# Patient Record
Sex: Female | Born: 1955
Health system: Southern US, Community
[De-identification: ages and names within clinical notes are randomized; demographics above are authoritative.]

## PROBLEM LIST (undated history)

## (undated) DIAGNOSIS — I10 Essential (primary) hypertension: Secondary | ICD-10-CM

## (undated) DIAGNOSIS — R011 Cardiac murmur, unspecified: Secondary | ICD-10-CM

## (undated) DIAGNOSIS — Z87442 Personal history of urinary calculi: Secondary | ICD-10-CM

## (undated) DIAGNOSIS — Z9221 Personal history of antineoplastic chemotherapy: Secondary | ICD-10-CM

## (undated) DIAGNOSIS — G5760 Lesion of plantar nerve, unspecified lower limb: Secondary | ICD-10-CM

## (undated) DIAGNOSIS — J189 Pneumonia, unspecified organism: Secondary | ICD-10-CM

## (undated) DIAGNOSIS — C801 Malignant (primary) neoplasm, unspecified: Secondary | ICD-10-CM

## (undated) DIAGNOSIS — I350 Nonrheumatic aortic (valve) stenosis: Secondary | ICD-10-CM

## (undated) DIAGNOSIS — K219 Gastro-esophageal reflux disease without esophagitis: Secondary | ICD-10-CM

## (undated) DIAGNOSIS — Z923 Personal history of irradiation: Secondary | ICD-10-CM

## (undated) DIAGNOSIS — R7303 Prediabetes: Secondary | ICD-10-CM

## (undated) DIAGNOSIS — M1990 Unspecified osteoarthritis, unspecified site: Secondary | ICD-10-CM

## (undated) DIAGNOSIS — E785 Hyperlipidemia, unspecified: Secondary | ICD-10-CM

## (undated) DIAGNOSIS — I499 Cardiac arrhythmia, unspecified: Secondary | ICD-10-CM

## (undated) DIAGNOSIS — F419 Anxiety disorder, unspecified: Secondary | ICD-10-CM

## (undated) DIAGNOSIS — I2510 Atherosclerotic heart disease of native coronary artery without angina pectoris: Secondary | ICD-10-CM

## (undated) DIAGNOSIS — C50919 Malignant neoplasm of unspecified site of unspecified female breast: Secondary | ICD-10-CM

## (undated) DIAGNOSIS — I509 Heart failure, unspecified: Secondary | ICD-10-CM

## (undated) HISTORY — DX: Heart failure, unspecified: I50.9

## (undated) HISTORY — DX: Malignant neoplasm of unspecified site of unspecified female breast: C50.919

## (undated) HISTORY — DX: Hyperlipidemia, unspecified: E78.5

## (undated) HISTORY — DX: Gastro-esophageal reflux disease without esophagitis: K21.9

## (undated) HISTORY — PX: BREAST LUMPECTOMY: SHX2

## (undated) HISTORY — DX: Malignant (primary) neoplasm, unspecified: C80.1

## (undated) HISTORY — DX: Nonrheumatic aortic (valve) stenosis: I35.0

## (undated) HISTORY — DX: Essential (primary) hypertension: I10

## (undated) HISTORY — DX: Lesion of plantar nerve, unspecified lower limb: G57.60

---

## 2002-07-21 DIAGNOSIS — C50919 Malignant neoplasm of unspecified site of unspecified female breast: Secondary | ICD-10-CM

## 2002-07-21 HISTORY — DX: Malignant neoplasm of unspecified site of unspecified female breast: C50.919

## 2002-10-20 DIAGNOSIS — C801 Malignant (primary) neoplasm, unspecified: Secondary | ICD-10-CM

## 2002-10-20 HISTORY — PX: BREAST SURGERY: SHX581

## 2002-10-20 HISTORY — DX: Malignant (primary) neoplasm, unspecified: C80.1

## 2002-11-19 DIAGNOSIS — Z9221 Personal history of antineoplastic chemotherapy: Secondary | ICD-10-CM

## 2002-11-19 HISTORY — DX: Personal history of antineoplastic chemotherapy: Z92.21

## 2003-02-19 DIAGNOSIS — Z923 Personal history of irradiation: Secondary | ICD-10-CM

## 2003-02-19 HISTORY — DX: Personal history of irradiation: Z92.3

## 2003-02-27 ENCOUNTER — Ambulatory Visit: Admission: RE | Admit: 2003-02-27 | Discharge: 2003-04-28 | Payer: Self-pay | Admitting: *Deleted

## 2003-06-02 ENCOUNTER — Ambulatory Visit: Admission: RE | Admit: 2003-06-02 | Discharge: 2003-06-02 | Payer: Self-pay | Admitting: *Deleted

## 2003-08-24 ENCOUNTER — Other Ambulatory Visit: Admission: RE | Admit: 2003-08-24 | Discharge: 2003-08-24 | Payer: Self-pay | Admitting: Obstetrics and Gynecology

## 2003-09-11 ENCOUNTER — Encounter: Admission: RE | Admit: 2003-09-11 | Discharge: 2003-09-11 | Payer: Self-pay | Admitting: Oncology

## 2004-05-17 ENCOUNTER — Ambulatory Visit: Payer: Self-pay | Admitting: Oncology

## 2004-05-21 ENCOUNTER — Encounter: Admission: RE | Admit: 2004-05-21 | Discharge: 2004-05-21 | Payer: Self-pay | Admitting: Internal Medicine

## 2004-06-07 ENCOUNTER — Ambulatory Visit: Admission: RE | Admit: 2004-06-07 | Discharge: 2004-06-07 | Payer: Self-pay | Admitting: *Deleted

## 2004-08-29 ENCOUNTER — Other Ambulatory Visit: Admission: RE | Admit: 2004-08-29 | Discharge: 2004-08-29 | Payer: Self-pay | Admitting: Obstetrics and Gynecology

## 2004-09-10 ENCOUNTER — Ambulatory Visit: Payer: Self-pay | Admitting: Oncology

## 2004-09-11 ENCOUNTER — Encounter: Admission: RE | Admit: 2004-09-11 | Discharge: 2004-09-11 | Payer: Self-pay | Admitting: Oncology

## 2004-11-01 ENCOUNTER — Ambulatory Visit: Payer: Self-pay | Admitting: Oncology

## 2005-02-28 ENCOUNTER — Ambulatory Visit: Payer: Self-pay | Admitting: Oncology

## 2005-07-17 ENCOUNTER — Ambulatory Visit: Payer: Self-pay | Admitting: Oncology

## 2005-09-15 ENCOUNTER — Other Ambulatory Visit: Admission: RE | Admit: 2005-09-15 | Discharge: 2005-09-15 | Payer: Self-pay | Admitting: Obstetrics and Gynecology

## 2005-09-16 ENCOUNTER — Encounter: Admission: RE | Admit: 2005-09-16 | Discharge: 2005-09-16 | Payer: Self-pay | Admitting: Oncology

## 2005-09-26 ENCOUNTER — Ambulatory Visit: Payer: Self-pay | Admitting: Oncology

## 2005-09-29 ENCOUNTER — Ambulatory Visit: Payer: Self-pay

## 2005-09-29 ENCOUNTER — Encounter: Payer: Self-pay | Admitting: Internal Medicine

## 2006-01-12 ENCOUNTER — Ambulatory Visit: Payer: Self-pay

## 2006-01-12 ENCOUNTER — Encounter: Payer: Self-pay | Admitting: Cardiology

## 2006-01-12 ENCOUNTER — Ambulatory Visit: Payer: Self-pay | Admitting: Oncology

## 2006-01-12 LAB — COMPREHENSIVE METABOLIC PANEL
ALT: 31 U/L (ref 0–40)
AST: 21 U/L (ref 0–37)
Calcium: 8.9 mg/dL (ref 8.4–10.5)
Chloride: 102 mEq/L (ref 96–112)
Creatinine, Ser: 0.83 mg/dL (ref 0.40–1.20)
Sodium: 138 mEq/L (ref 135–145)
Total Bilirubin: 0.8 mg/dL (ref 0.3–1.2)
Total Protein: 6.8 g/dL (ref 6.0–8.3)

## 2006-01-12 LAB — CBC WITH DIFFERENTIAL/PLATELET
Basophils Absolute: 0 10*3/uL (ref 0.0–0.1)
HCT: 33.9 % — ABNORMAL LOW (ref 34.8–46.6)
HGB: 11.6 g/dL (ref 11.6–15.9)
LYMPH%: 31.8 % (ref 14.0–48.0)
MONO#: 0.4 10*3/uL (ref 0.1–0.9)
NEUT%: 57.5 % (ref 39.6–76.8)
Platelets: 327 10*3/uL (ref 145–400)
WBC: 4.2 10*3/uL (ref 3.9–10.0)
lymph#: 1.3 10*3/uL (ref 0.9–3.3)

## 2006-01-12 LAB — RESEARCH LABS

## 2006-04-02 ENCOUNTER — Ambulatory Visit: Payer: Self-pay | Admitting: Oncology

## 2006-04-06 ENCOUNTER — Ambulatory Visit: Payer: Self-pay

## 2006-04-06 ENCOUNTER — Encounter: Payer: Self-pay | Admitting: Cardiovascular Disease

## 2006-06-09 ENCOUNTER — Ambulatory Visit: Payer: Self-pay | Admitting: Internal Medicine

## 2006-06-22 ENCOUNTER — Ambulatory Visit: Payer: Self-pay | Admitting: Internal Medicine

## 2006-07-03 ENCOUNTER — Ambulatory Visit: Payer: Self-pay | Admitting: Oncology

## 2006-07-08 ENCOUNTER — Encounter: Payer: Self-pay | Admitting: Cardiology

## 2006-07-08 ENCOUNTER — Ambulatory Visit: Payer: Self-pay

## 2006-08-05 ENCOUNTER — Ambulatory Visit: Payer: Self-pay | Admitting: Oncology

## 2006-08-05 LAB — COMPREHENSIVE METABOLIC PANEL
CO2: 26 mEq/L (ref 19–32)
Creatinine, Ser: 0.76 mg/dL (ref 0.40–1.20)
Glucose, Bld: 96 mg/dL (ref 70–99)
Total Bilirubin: 0.5 mg/dL (ref 0.3–1.2)

## 2006-09-17 ENCOUNTER — Encounter: Admission: RE | Admit: 2006-09-17 | Discharge: 2006-09-17 | Payer: Self-pay | Admitting: Oncology

## 2006-09-20 ENCOUNTER — Encounter: Admission: RE | Admit: 2006-09-20 | Discharge: 2006-09-20 | Payer: Self-pay | Admitting: Oncology

## 2006-09-30 ENCOUNTER — Encounter: Admission: RE | Admit: 2006-09-30 | Discharge: 2006-09-30 | Payer: Self-pay | Admitting: Oncology

## 2007-01-05 ENCOUNTER — Ambulatory Visit: Payer: Self-pay | Admitting: Oncology

## 2007-01-08 LAB — COMPREHENSIVE METABOLIC PANEL
CO2: 27 mEq/L (ref 19–32)
Creatinine, Ser: 0.83 mg/dL (ref 0.40–1.20)
Glucose, Bld: 101 mg/dL — ABNORMAL HIGH (ref 70–99)
Total Bilirubin: 0.8 mg/dL (ref 0.3–1.2)
Total Protein: 7.8 g/dL (ref 6.0–8.3)

## 2007-01-08 LAB — CBC WITH DIFFERENTIAL/PLATELET
Basophils Absolute: 0 10*3/uL (ref 0.0–0.1)
Eosinophils Absolute: 0.1 10*3/uL (ref 0.0–0.5)
HCT: 37.9 % (ref 34.8–46.6)
LYMPH%: 33.8 % (ref 14.0–48.0)
MONO#: 0.3 10*3/uL (ref 0.1–0.9)
NEUT#: 2.4 10*3/uL (ref 1.5–6.5)
NEUT%: 55.1 % (ref 39.6–76.8)
Platelets: 293 10*3/uL (ref 145–400)
WBC: 4.3 10*3/uL (ref 3.9–10.0)

## 2007-02-05 ENCOUNTER — Encounter: Payer: Self-pay | Admitting: Oncology

## 2007-02-05 ENCOUNTER — Ambulatory Visit: Payer: Self-pay

## 2007-06-28 ENCOUNTER — Ambulatory Visit: Payer: Self-pay | Admitting: Oncology

## 2007-06-30 ENCOUNTER — Encounter: Payer: Self-pay | Admitting: Internal Medicine

## 2007-06-30 LAB — COMPREHENSIVE METABOLIC PANEL
ALT: 30 U/L (ref 0–35)
Albumin: 4.5 g/dL (ref 3.5–5.2)
Alkaline Phosphatase: 94 U/L (ref 39–117)
Potassium: 4 mEq/L (ref 3.5–5.3)
Sodium: 142 mEq/L (ref 135–145)
Total Bilirubin: 0.3 mg/dL (ref 0.3–1.2)
Total Protein: 7.5 g/dL (ref 6.0–8.3)

## 2007-06-30 LAB — CBC WITH DIFFERENTIAL/PLATELET
Eosinophils Absolute: 0.1 10*3/uL (ref 0.0–0.5)
HCT: 36.6 % (ref 34.8–46.6)
LYMPH%: 30.3 % (ref 14.0–48.0)
MONO#: 0.3 10*3/uL (ref 0.1–0.9)
NEUT#: 2.9 10*3/uL (ref 1.5–6.5)
Platelets: 352 10*3/uL (ref 145–400)
RBC: 4.51 10*6/uL (ref 3.70–5.32)
WBC: 4.9 10*3/uL (ref 3.9–10.0)

## 2007-09-27 ENCOUNTER — Encounter: Admission: RE | Admit: 2007-09-27 | Discharge: 2007-09-27 | Payer: Self-pay | Admitting: Oncology

## 2007-12-29 ENCOUNTER — Ambulatory Visit: Payer: Self-pay | Admitting: Oncology

## 2007-12-31 ENCOUNTER — Encounter: Payer: Self-pay | Admitting: Internal Medicine

## 2007-12-31 LAB — COMPREHENSIVE METABOLIC PANEL
ALT: 30 U/L (ref 0–35)
AST: 21 U/L (ref 0–37)
Albumin: 4.6 g/dL (ref 3.5–5.2)
Alkaline Phosphatase: 87 U/L (ref 39–117)
BUN: 16 mg/dL (ref 6–23)
Calcium: 9.5 mg/dL (ref 8.4–10.5)
Chloride: 105 mEq/L (ref 96–112)
Creatinine, Ser: 0.85 mg/dL (ref 0.40–1.20)
Potassium: 4.3 mEq/L (ref 3.5–5.3)

## 2007-12-31 LAB — CBC WITH DIFFERENTIAL/PLATELET
BASO%: 1.1 % (ref 0.0–2.0)
Basophils Absolute: 0 10*3/uL (ref 0.0–0.1)
EOS%: 4.7 % (ref 0.0–7.0)
MCH: 27.7 pg (ref 26.0–34.0)
MCHC: 34.5 g/dL (ref 32.0–36.0)
MCV: 80.3 fL — ABNORMAL LOW (ref 81.0–101.0)
MONO%: 7.9 % (ref 0.0–13.0)
RDW: 13.6 % (ref 11.3–14.5)
lymph#: 1.6 10*3/uL (ref 0.9–3.3)

## 2008-01-07 ENCOUNTER — Encounter: Admission: RE | Admit: 2008-01-07 | Discharge: 2008-01-07 | Payer: Self-pay | Admitting: Oncology

## 2008-03-13 ENCOUNTER — Ambulatory Visit (HOSPITAL_COMMUNITY): Admission: RE | Admit: 2008-03-13 | Discharge: 2008-03-13 | Payer: Self-pay | Admitting: Urology

## 2008-03-15 ENCOUNTER — Ambulatory Visit: Payer: Self-pay | Admitting: Oncology

## 2008-04-21 ENCOUNTER — Encounter: Payer: Self-pay | Admitting: Internal Medicine

## 2008-04-21 LAB — CBC WITH DIFFERENTIAL/PLATELET
BASO%: 0.9 % (ref 0.0–2.0)
HCT: 37.2 % (ref 34.8–46.6)
LYMPH%: 29.7 % (ref 14.0–48.0)
MCH: 28.3 pg (ref 26.0–34.0)
MCHC: 34.7 g/dL (ref 32.0–36.0)
MCV: 81.7 fL (ref 81.0–101.0)
MONO#: 0.4 10*3/uL (ref 0.1–0.9)
MONO%: 9.2 % (ref 0.0–13.0)
NEUT%: 54.1 % (ref 39.6–76.8)
Platelets: 292 10*3/uL (ref 145–400)
RBC: 4.55 10*6/uL (ref 3.70–5.32)
WBC: 4.7 10*3/uL (ref 3.9–10.0)

## 2008-04-21 LAB — COMPREHENSIVE METABOLIC PANEL
ALT: 50 U/L — ABNORMAL HIGH (ref 0–35)
Alkaline Phosphatase: 90 U/L (ref 39–117)
CO2: 26 mEq/L (ref 19–32)
Creatinine, Ser: 0.81 mg/dL (ref 0.40–1.20)
Sodium: 140 mEq/L (ref 135–145)
Total Bilirubin: 0.4 mg/dL (ref 0.3–1.2)

## 2008-05-17 ENCOUNTER — Ambulatory Visit: Payer: Self-pay | Admitting: Oncology

## 2008-05-19 LAB — COMPREHENSIVE METABOLIC PANEL
Albumin: 4.7 g/dL (ref 3.5–5.2)
BUN: 19 mg/dL (ref 6–23)
CO2: 25 mEq/L (ref 19–32)
Glucose, Bld: 87 mg/dL (ref 70–99)
Potassium: 4 mEq/L (ref 3.5–5.3)
Sodium: 138 mEq/L (ref 135–145)
Total Bilirubin: 0.3 mg/dL (ref 0.3–1.2)
Total Protein: 7.6 g/dL (ref 6.0–8.3)

## 2008-09-27 ENCOUNTER — Encounter: Admission: RE | Admit: 2008-09-27 | Discharge: 2008-09-27 | Payer: Self-pay | Admitting: Oncology

## 2008-12-27 ENCOUNTER — Ambulatory Visit: Payer: Self-pay | Admitting: Oncology

## 2008-12-29 LAB — COMPREHENSIVE METABOLIC PANEL
ALT: 15 U/L (ref 0–35)
AST: 21 U/L (ref 0–37)
Albumin: 4.7 g/dL (ref 3.5–5.2)
Alkaline Phosphatase: 68 U/L (ref 39–117)
Glucose, Bld: 116 mg/dL — ABNORMAL HIGH (ref 70–99)
Potassium: 4.3 mEq/L (ref 3.5–5.3)
Sodium: 140 mEq/L (ref 135–145)
Total Bilirubin: 0.7 mg/dL (ref 0.3–1.2)
Total Protein: 7.5 g/dL (ref 6.0–8.3)

## 2008-12-29 LAB — CBC WITH DIFFERENTIAL/PLATELET
MCH: 28.6 pg (ref 25.1–34.0)
MCHC: 35 g/dL (ref 31.5–36.0)
MONO#: 0.3 10*3/uL (ref 0.1–0.9)
NEUT%: 50.9 % (ref 38.4–76.8)
RBC: 4.52 10*6/uL (ref 3.70–5.45)
RDW: 13.4 % (ref 11.2–14.5)
WBC: 3.5 10*3/uL — ABNORMAL LOW (ref 3.9–10.3)
lymph#: 1.3 10*3/uL (ref 0.9–3.3)

## 2009-01-12 ENCOUNTER — Encounter: Admission: RE | Admit: 2009-01-12 | Discharge: 2009-01-12 | Payer: Self-pay | Admitting: Oncology

## 2009-01-25 ENCOUNTER — Ambulatory Visit: Payer: Self-pay | Admitting: Oncology

## 2009-03-07 ENCOUNTER — Ambulatory Visit: Payer: Self-pay | Admitting: Oncology

## 2009-06-25 ENCOUNTER — Ambulatory Visit: Payer: Self-pay | Admitting: Oncology

## 2009-06-27 LAB — CBC WITH DIFFERENTIAL/PLATELET
Basophils Absolute: 0 10*3/uL (ref 0.0–0.1)
EOS%: 2.6 % (ref 0.0–7.0)
HGB: 12.1 g/dL (ref 11.6–15.9)
LYMPH%: 31 % (ref 14.0–49.7)
MCH: 28.1 pg (ref 25.1–34.0)
MCHC: 33.6 g/dL (ref 31.5–36.0)
MCV: 83.6 fL (ref 79.5–101.0)
NEUT#: 2.9 10*3/uL (ref 1.5–6.5)
NEUT%: 57.7 % (ref 38.4–76.8)
WBC: 5.1 10*3/uL (ref 3.9–10.3)
lymph#: 1.6 10*3/uL (ref 0.9–3.3)

## 2009-06-27 LAB — COMPREHENSIVE METABOLIC PANEL
ALT: 221 U/L — ABNORMAL HIGH (ref 0–35)
AST: 22 U/L (ref 0–37)
Albumin: 4.2 g/dL (ref 3.5–5.2)
Alkaline Phosphatase: 65 U/L (ref 39–117)
BUN: 11 mg/dL (ref 6–23)
CO2: 30 mEq/L (ref 19–32)
Calcium: 9.3 mg/dL (ref 8.4–10.5)
Chloride: 102 mEq/L (ref 96–112)
Sodium: 139 mEq/L (ref 135–145)

## 2009-06-28 LAB — VITAMIN D 25 HYDROXY (VIT D DEFICIENCY, FRACTURES): Vit D, 25-Hydroxy: 42 ng/mL (ref 30–89)

## 2009-07-06 ENCOUNTER — Encounter: Payer: Self-pay | Admitting: Internal Medicine

## 2009-07-06 LAB — COMPREHENSIVE METABOLIC PANEL
ALT: 28 U/L (ref 0–35)
AST: 22 U/L (ref 0–37)
Albumin: 4.2 g/dL (ref 3.5–5.2)
Alkaline Phosphatase: 69 U/L (ref 39–117)
CO2: 31 mEq/L (ref 19–32)
Calcium: 10.3 mg/dL (ref 8.4–10.5)
Potassium: 4.5 mEq/L (ref 3.5–5.3)

## 2009-09-28 ENCOUNTER — Encounter: Admission: RE | Admit: 2009-09-28 | Discharge: 2009-09-28 | Payer: Self-pay | Admitting: Oncology

## 2009-10-11 IMAGING — CT CT UROGRAM
2 of 3 series · 15 of 42 positions shown, 19 images · non-contrast
Comparison: NONE

CLINICAL DATA: [REDACTED] pain. 

CT UROGRAM
TECHNIQUE: Thin-section unenhanced axial images were obtained to 
provide a CT urogram to evaluate for possible urinary tract stone. 
 Scan thicknesses were 3 mm with 3-mm increments. COMPARISON:No 
prior exam.

[Series 2: wo · axial · 0.68mm/px · z∈[+436,+805]mm · 12 of 139 slices shown, 16 images]
[im 8/139  soft-tissue]
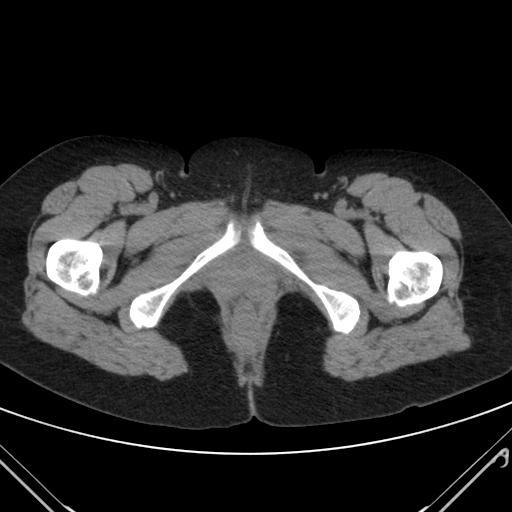
[im 8/139  bone]
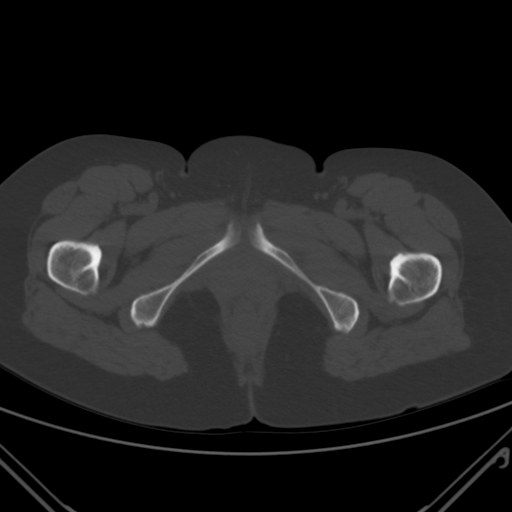
[im 22/139  soft-tissue]
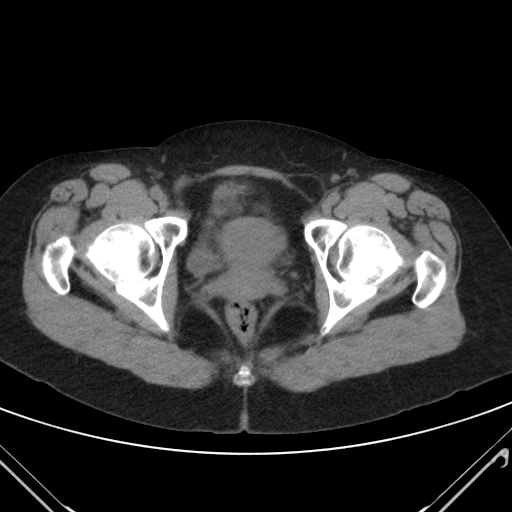
[im 37/139  soft-tissue]
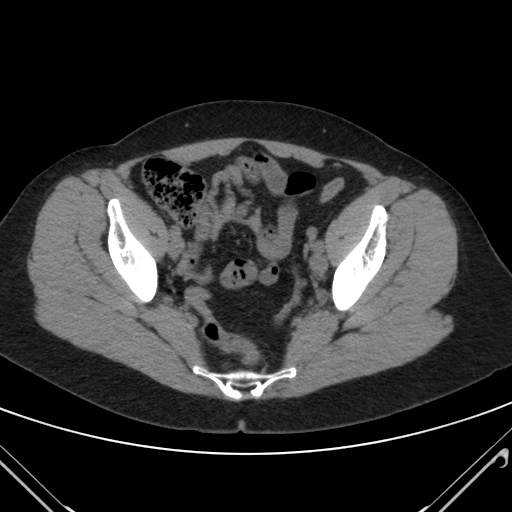
[im 51/139  soft-tissue]
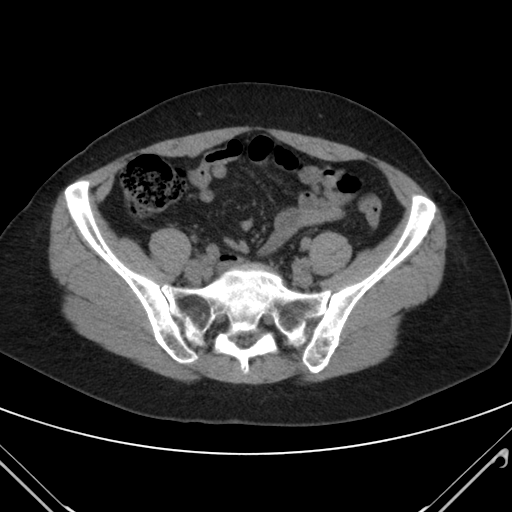
[im 66/139  soft-tissue]
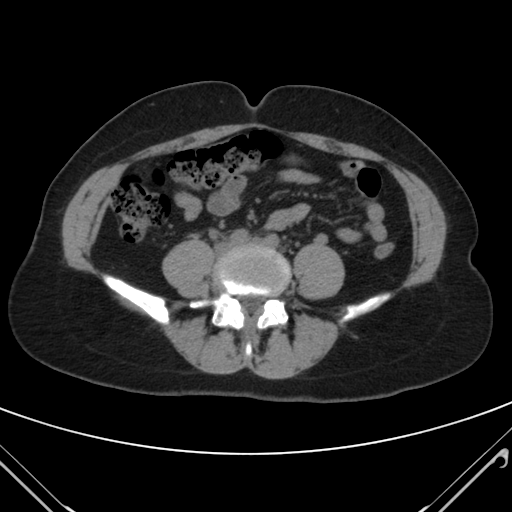
[im 73/139  soft-tissue]
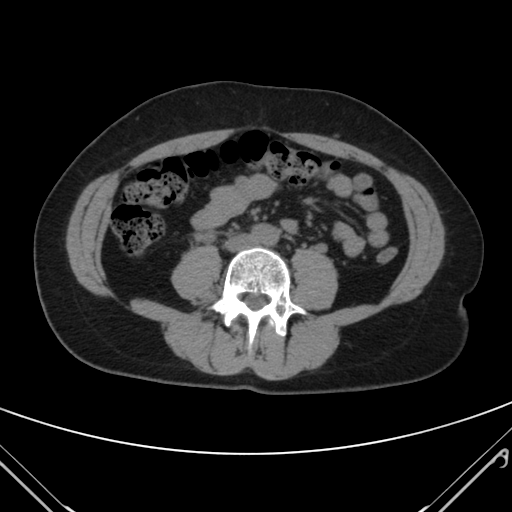
[im 88/139  soft-tissue]
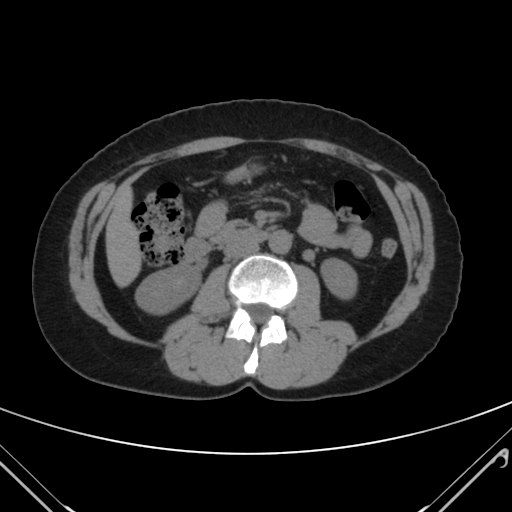
[im 102/139  soft-tissue]
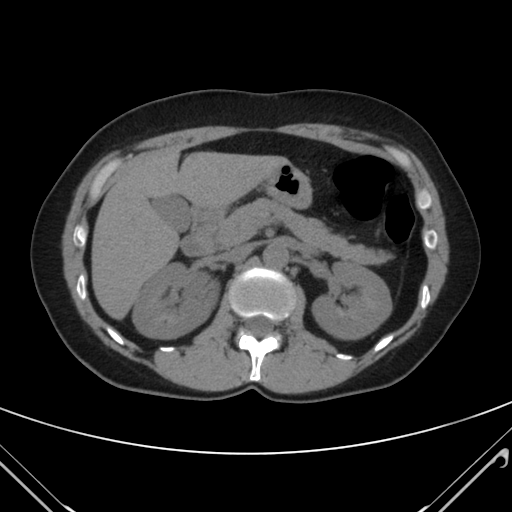
[im 109/139  lung]
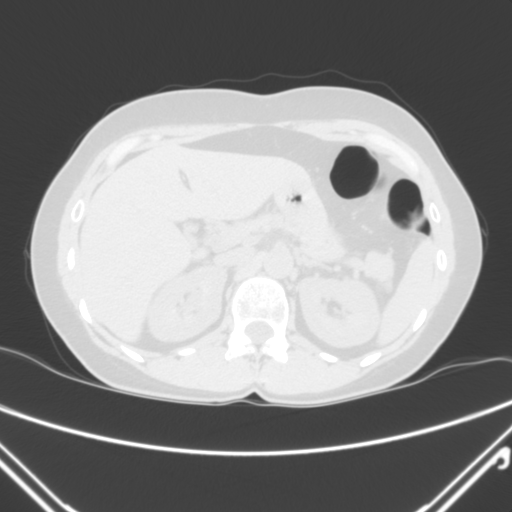
[im 117/139  soft-tissue]
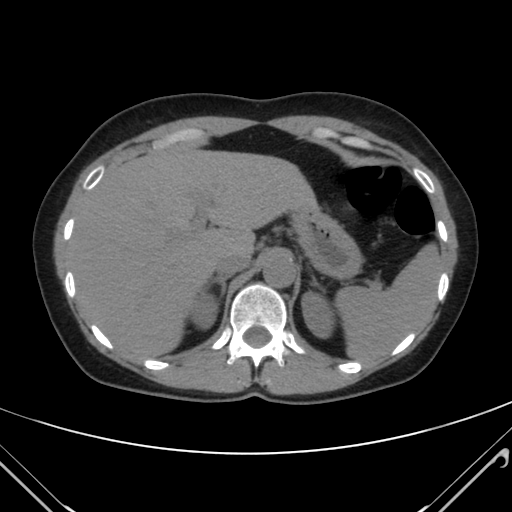
[im 117/139  lung]
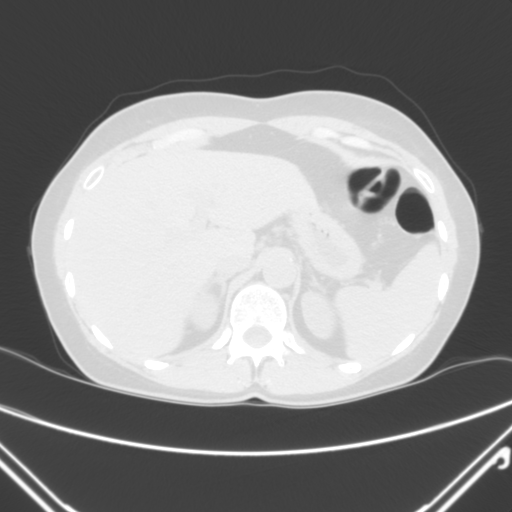
[im 117/139  bone]
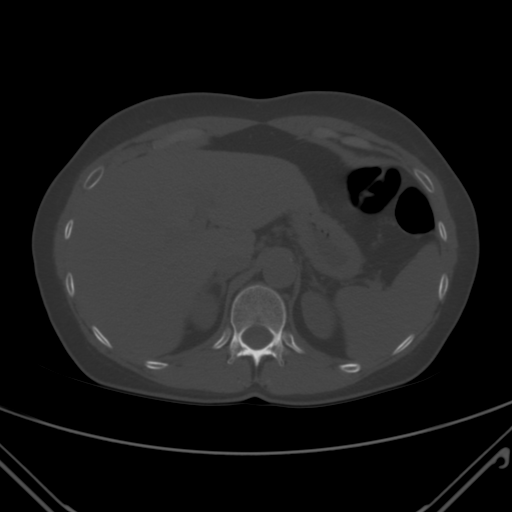
[im 124/139  lung]
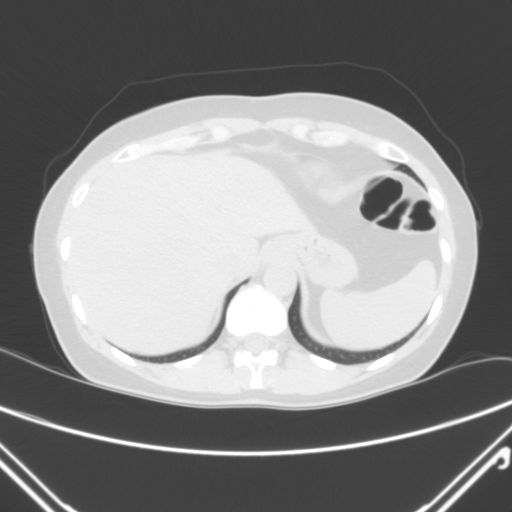
[im 131/139  soft-tissue]
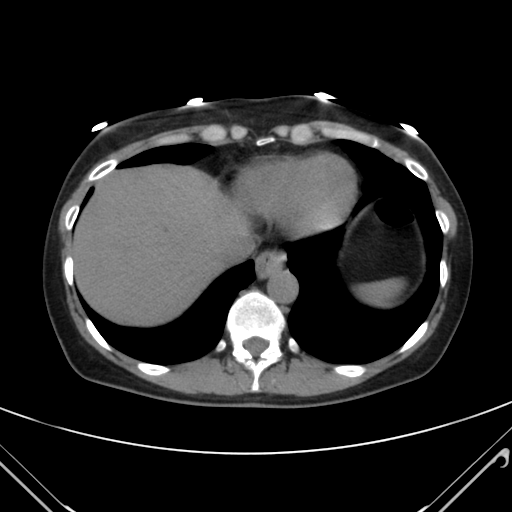
[im 131/139  lung]
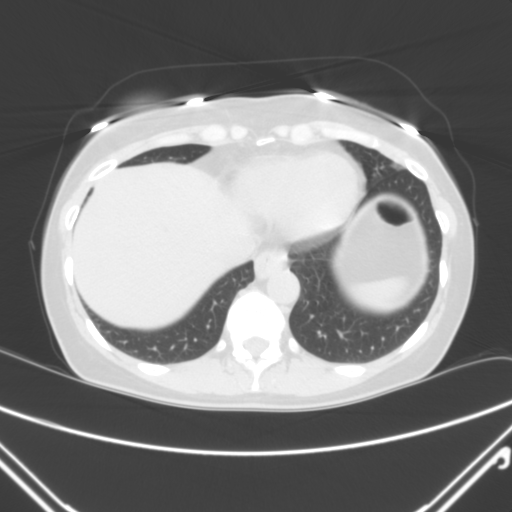

[coronals · coronal · 0.81mm/px · 3 of 65 slices shown]
[im 22/65  soft-tissue]
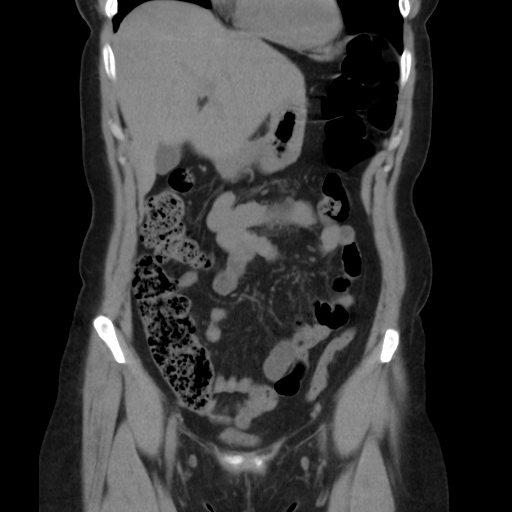
[im 29/65  soft-tissue]
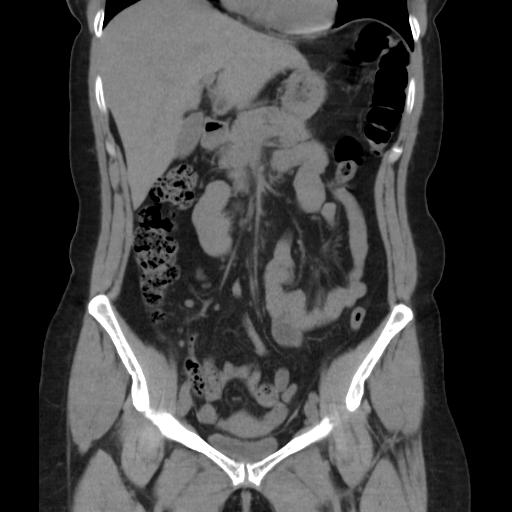
[im 36/65  soft-tissue]
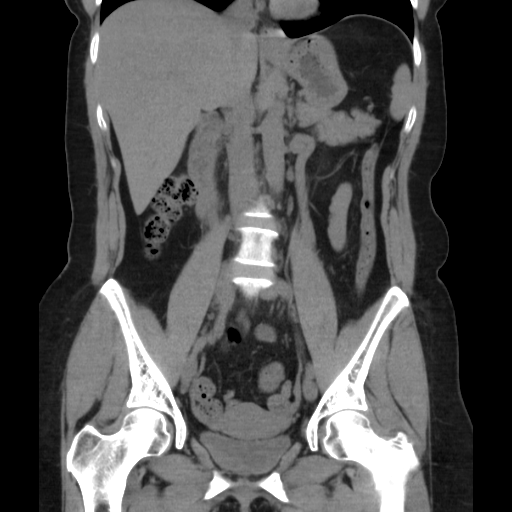

[15 of 42 positions shown; findings below may reference images not displayed]

FINDINGS: Lung bases and pleural cavities are normal.  The liver, 
spleen, pancreas, and adrenal glands are normal.  High-density 
bile in the gallbladder consistent with sludge/small noncalcified 
gallstones.  No thickening of the gallbladder wall or 
pericholecystic fluid.  No calcified renal calculi.  There is a 
6.0 mm obstructing stone in the upper pelvic portion of the right 
ureter at the level of the sacroiliac joints with proximal 
moderate right hydroureter and right hydronephrosis.  No stones or 
noted in the left ureter.  No diverticulosis or acute 
diverticulitis.  Normal appendix.  No adnexal masses or ascites.  
No bony abnormality.
IMPRESSION: 6.0 mm obstructing stone in the upper pelvic portion 
of the right ureter. Possible gallbladder sludge.  Correlate with 
clinical suspicion and abdominal sonography if needed. Konnerth, Cazibe 
03/09/2008 Dict Date: 03/09/08Trans Date: 03/09/2008 Sidi Del Monte  JLM

## 2010-01-02 ENCOUNTER — Ambulatory Visit: Payer: Self-pay | Admitting: Oncology

## 2010-01-04 LAB — COMPREHENSIVE METABOLIC PANEL
ALT: 20 U/L (ref 0–35)
AST: 15 U/L (ref 0–37)
Albumin: 4.3 g/dL (ref 3.5–5.2)
BUN: 15 mg/dL (ref 6–23)
CO2: 30 mEq/L (ref 19–32)
Chloride: 100 mEq/L (ref 96–112)
Creatinine, Ser: 0.84 mg/dL (ref 0.40–1.20)
Glucose, Bld: 87 mg/dL (ref 70–99)
Potassium: 4.3 mEq/L (ref 3.5–5.3)

## 2010-01-04 LAB — CBC WITH DIFFERENTIAL/PLATELET
BASO%: 1.3 % (ref 0.0–2.0)
LYMPH%: 23.2 % (ref 14.0–49.7)
MCHC: 33.8 g/dL (ref 31.5–36.0)
MCV: 83.5 fL (ref 79.5–101.0)
NEUT#: 4.1 10*3/uL (ref 1.5–6.5)
RBC: 4.61 10*6/uL (ref 3.70–5.45)
RDW: 14.7 % — ABNORMAL HIGH (ref 11.2–14.5)

## 2010-06-06 ENCOUNTER — Other Ambulatory Visit: Admission: RE | Admit: 2010-06-06 | Discharge: 2010-06-06 | Payer: Self-pay | Admitting: Family Medicine

## 2010-08-10 ENCOUNTER — Other Ambulatory Visit: Payer: Self-pay | Admitting: Oncology

## 2010-08-10 DIAGNOSIS — Z9889 Other specified postprocedural states: Secondary | ICD-10-CM

## 2010-08-20 NOTE — Letter (Signed)
[facility]   Imported By: Lester [place] 08/17/2009 16:05:45  _____________________________________________________________________  External Attachment:    Type:   Image     Comment:   External Document

## 2010-09-30 ENCOUNTER — Ambulatory Visit
Admission: RE | Admit: 2010-09-30 | Discharge: 2010-09-30 | Disposition: A | Discharge status: Home / Self Care | Payer: Commercial Indemnity | Source: Ambulatory Visit | Attending: Oncology | Admitting: Oncology

## 2010-09-30 DIAGNOSIS — Z9889 Other specified postprocedural states: Secondary | ICD-10-CM

## 2010-10-03 ENCOUNTER — Other Ambulatory Visit: Payer: Self-pay | Admitting: Oncology

## 2010-10-03 DIAGNOSIS — R928 Other abnormal and inconclusive findings on diagnostic imaging of breast: Secondary | ICD-10-CM

## 2010-10-09 ENCOUNTER — Ambulatory Visit
Admission: RE | Admit: 2010-10-09 | Discharge: 2010-10-09 | Disposition: A | Discharge status: Home / Self Care | Payer: Commercial Indemnity | Source: Ambulatory Visit | Attending: Oncology | Admitting: Oncology

## 2010-10-09 DIAGNOSIS — R928 Other abnormal and inconclusive findings on diagnostic imaging of breast: Secondary | ICD-10-CM

## 2010-12-06 NOTE — Assessment & Plan Note (Signed)
[facility]                           GASTROENTEROLOGY OFFICE NOTE   NAME:[name], [name]                          MRN:          [id number]  DATE:06/09/2006                            DOB:          Jan 08, 1956    [name] is a very nice 55 year old patient of Dr. Darrold Span, who is here to  discuss colorectal screening.  She has no specific GI symptoms except for  diarrhea induced by Laptinib 6 tablets daily.  She has had occasional blood  about her rectum associated with frequent stools.  She never had Hemoccult  cards.  She was given Hemoccults by Dr. Darrold Span, but her insurance will  cover either colonoscopy or Hemoccults, so she elected to go ahead with  colonoscopy.  There is no family history of colon cancer.  She has a history  of T1 N0 ER/PR positive verrucous carcinoma, and continues on Femara.  The  diagnosis of the carcinoma was made in February 2004.  She had a lumpectomy  and 3 sentinel node evaluation, four cycles of adjuvant Adriamycin and  Cytoxan, local radiation, on tamoxifen in November 2004.   MEDICATIONS:  1. Laptinib 6 tablets daily.  2. Femara.  3. Multiple vitamins.  4. Calcium supplements.   PAST MEDICAL HISTORY:  Significant only for breast-related abnormalities.   FAMILY HISTORY:  Positive for diabetes in mother, heart disease in mother  and brother, one half-brother just had cancer of the bladder.   SOCIAL HISTORY:  She is divorced with two children, works as a Psychologist, occupational.  She does not smoke, drinks alcohol occasionally.   REVIEW OF SYSTEMS:  Positive for eyeglasses, swelling of her feet especially  the left one, arthritic complaints, skin rash, vision changes.   PHYSICAL EXAMINATION:  Blood pressure 110/78, pulse 80, weight 144 pounds.  She was alert, oriented, in no distress.  LUNGS:  Clear to auscultation.  CARDIAC:  Cor with normal S1, normal S2.  ABDOMEN:  Soft, nontender with normal active bowel  sounds.  Liver edge at  the costal margin, no fullness.  RECTAL:  Exam not done today.  EXTREMITIES:  Trace edema in the left leg, no edema in the right leg.   IMPRESSION:  1. [age]-year-old white female with history of right breast cancer,      currently doing well.  She is a high risk for colon cancer.  2. Diarrhea related to her medication versus irritable bowel syndrome,      doubt inflammatory bowel disease.   PLAN:  Colonoscopy has been scheduled with routine colonoscopy prep.  I have  discussed the procedure today as well as the prep with the patient, and she  agreed to schedule.  She will currently continue all her medications.     Hedwig Morton. Juanda Chance, MD  Electronically Signed    DMB/MedQ  DD: 06/09/2006  DT: 06/10/2006  Job #: [id number]   cc:   Lennis P. Darrold Span, M.D.

## 2011-01-03 ENCOUNTER — Other Ambulatory Visit: Payer: Self-pay | Admitting: Oncology

## 2011-01-03 ENCOUNTER — Encounter (HOSPITAL_BASED_OUTPATIENT_CLINIC_OR_DEPARTMENT_OTHER): Payer: Managed Care, Other (non HMO) | Admitting: Oncology

## 2011-01-03 DIAGNOSIS — M899 Disorder of bone, unspecified: Secondary | ICD-10-CM

## 2011-01-03 DIAGNOSIS — C50219 Malignant neoplasm of upper-inner quadrant of unspecified female breast: Secondary | ICD-10-CM

## 2011-01-03 DIAGNOSIS — Z1231 Encounter for screening mammogram for malignant neoplasm of breast: Secondary | ICD-10-CM

## 2011-01-03 LAB — CBC WITH DIFFERENTIAL/PLATELET
EOS%: 2.4 % (ref 0.0–7.0)
HCT: 36.3 % (ref 34.8–46.6)
HGB: 12.4 g/dL (ref 11.6–15.9)
LYMPH%: 33.6 % (ref 14.0–49.7)
MCV: 82.3 fL (ref 79.5–101.0)
MONO#: 0.4 10*3/uL (ref 0.1–0.9)
MONO%: 9.9 % (ref 0.0–14.0)
NEUT#: 2.2 10*3/uL (ref 1.5–6.5)
Platelets: 282 10*3/uL (ref 145–400)
RDW: 14.2 % (ref 11.2–14.5)
WBC: 4.1 10*3/uL (ref 3.9–10.3)

## 2011-01-03 LAB — COMPREHENSIVE METABOLIC PANEL
Albumin: 4.6 g/dL (ref 3.5–5.2)
Calcium: 9.5 mg/dL (ref 8.4–10.5)
Chloride: 105 mEq/L (ref 96–112)
Glucose, Bld: 90 mg/dL (ref 70–99)
Potassium: 4.1 mEq/L (ref 3.5–5.3)
Sodium: 141 mEq/L (ref 135–145)
Total Bilirubin: 0.6 mg/dL (ref 0.3–1.2)
Total Protein: 7.3 g/dL (ref 6.0–8.3)

## 2011-05-16 ENCOUNTER — Encounter: Payer: Self-pay | Admitting: *Deleted

## 2011-05-16 DIAGNOSIS — C50919 Malignant neoplasm of unspecified site of unspecified female breast: Secondary | ICD-10-CM | POA: Insufficient documentation

## 2011-06-09 ENCOUNTER — Telehealth: Payer: Self-pay

## 2011-06-09 NOTE — Telephone Encounter (Signed)
LEFT THE MESSAGE IN MS. Chicas'S  WORK VM THAT HER LAST DONE DENSITY TEST WAS ON 01-12-09 AT [facility].  HER PRIMARY CARE WANTS TO ORDER  ANOTHER SCAN.

## 2011-06-13 ENCOUNTER — Encounter: Payer: Self-pay | Admitting: *Deleted

## 2011-06-13 NOTE — Progress Notes (Signed)
06/13/11- 1:39pm- This patient was on the [employer] TEACH study.  The sponsor decided to stop this study in November 2012.  Letters were mailed to all of the study subjects alerting them to the study closure.  Rn discussed this pt's follow up with Dr. Darrold Span.  Dr. Darrold Span is aware that she can continue to see this patient at her discretion.

## 2011-06-17 ENCOUNTER — Other Ambulatory Visit: Payer: Self-pay | Admitting: Family Medicine

## 2011-06-17 DIAGNOSIS — M8580 Other specified disorders of bone density and structure, unspecified site: Secondary | ICD-10-CM

## 2011-06-25 ENCOUNTER — Ambulatory Visit
Admission: RE | Admit: 2011-06-25 | Discharge: 2011-06-25 | Disposition: A | Discharge status: Home / Self Care | Payer: Managed Care, Other (non HMO) | Source: Ambulatory Visit | Attending: Family Medicine | Admitting: Family Medicine

## 2011-06-25 DIAGNOSIS — M8580 Other specified disorders of bone density and structure, unspecified site: Secondary | ICD-10-CM

## 2011-10-01 ENCOUNTER — Ambulatory Visit
Admission: RE | Admit: 2011-10-01 | Discharge: 2011-10-01 | Disposition: A | Discharge status: Home / Self Care | Payer: Managed Care, Other (non HMO) | Source: Ambulatory Visit | Attending: Oncology | Admitting: Oncology

## 2011-10-01 DIAGNOSIS — Z1231 Encounter for screening mammogram for malignant neoplasm of breast: Secondary | ICD-10-CM

## 2011-12-23 ENCOUNTER — Telehealth: Payer: Self-pay | Admitting: Oncology

## 2011-12-23 NOTE — Telephone Encounter (Signed)
Talked to pt and gave her appt r/s from 01/09/12, she does not have a phone right now, so work number is the best to contact patientr

## 2012-01-09 ENCOUNTER — Other Ambulatory Visit: Payer: Managed Care, Other (non HMO) | Admitting: Lab

## 2012-01-09 ENCOUNTER — Ambulatory Visit: Payer: Managed Care, Other (non HMO) | Admitting: Oncology

## 2012-01-21 ENCOUNTER — Other Ambulatory Visit: Payer: Managed Care, Other (non HMO) | Admitting: Lab

## 2012-01-23 ENCOUNTER — Other Ambulatory Visit: Payer: Managed Care, Other (non HMO) | Admitting: Lab

## 2012-01-23 ENCOUNTER — Ambulatory Visit: Payer: Managed Care, Other (non HMO) | Admitting: Oncology

## 2012-01-28 ENCOUNTER — Ambulatory Visit (HOSPITAL_BASED_OUTPATIENT_CLINIC_OR_DEPARTMENT_OTHER): Payer: Managed Care, Other (non HMO) | Admitting: Oncology

## 2012-01-28 ENCOUNTER — Other Ambulatory Visit (HOSPITAL_BASED_OUTPATIENT_CLINIC_OR_DEPARTMENT_OTHER): Payer: Managed Care, Other (non HMO) | Admitting: Lab

## 2012-01-28 ENCOUNTER — Encounter: Payer: Self-pay | Admitting: Oncology

## 2012-01-28 VITALS — BP 130/76 | HR 76 | Temp 97.0°F | Wt 146.9 lb

## 2012-01-28 DIAGNOSIS — C50219 Malignant neoplasm of upper-inner quadrant of unspecified female breast: Secondary | ICD-10-CM

## 2012-01-28 DIAGNOSIS — C50919 Malignant neoplasm of unspecified site of unspecified female breast: Secondary | ICD-10-CM

## 2012-01-28 DIAGNOSIS — Z1231 Encounter for screening mammogram for malignant neoplasm of breast: Secondary | ICD-10-CM

## 2012-01-28 DIAGNOSIS — M899 Disorder of bone, unspecified: Secondary | ICD-10-CM

## 2012-01-28 LAB — CBC WITH DIFFERENTIAL/PLATELET
BASO%: 1.1 % (ref 0.0–2.0)
EOS%: 2.2 % (ref 0.0–7.0)
LYMPH%: 20.1 % (ref 14.0–49.7)
MCH: 27.8 pg (ref 25.1–34.0)
MCHC: 33.9 g/dL (ref 31.5–36.0)
MCV: 82.2 fL (ref 79.5–101.0)
MONO%: 10 % (ref 0.0–14.0)
Platelets: 297 10*3/uL (ref 145–400)
RBC: 4.24 10*6/uL (ref 3.70–5.45)

## 2012-01-28 LAB — COMPREHENSIVE METABOLIC PANEL
ALT: 18 U/L (ref 0–35)
CO2: 26 mEq/L (ref 19–32)
Calcium: 9.1 mg/dL (ref 8.4–10.5)
Chloride: 104 mEq/L (ref 96–112)
Creatinine, Ser: 0.78 mg/dL (ref 0.50–1.10)
Glucose, Bld: 98 mg/dL (ref 70–99)
Total Bilirubin: 0.5 mg/dL (ref 0.3–1.2)
Total Protein: 6.8 g/dL (ref 6.0–8.3)

## 2012-01-28 NOTE — Progress Notes (Signed)
OFFICE PROGRESS NOTE   01/28/2012   Physicians: Deboraha Sprang at [facility], K.[facility]son  INTERVAL HISTORY:  Patient is seen, alone for visit, in scheduled yearly follow up of her history of right breast cancer. She is followed on observation thru this office, and after this visit does not need follow up from standpoint of TEACH trial. Last mammograms were at [facility] 10-03-11, with scatered fibroglandular tissue and no mammographic findings of concern. Note patient is extremely claustrophobic so has not had routine breast MRIs.  History is of T1N0 right breast cancer which was low ER/PR poistive and Her-2 positive by FISH at lumpectomy with 3 sentinel node evaluation in April 2004 in [place], [place]. The breast cancer was found on routine mammogram., and patient was possibly perimenopausal at age 56 at that diagnosis.  She was treated with adjuvant adriamycin/cytoxan, local radiation and tamoxifen from Nov 2004 thru Feb 2006 then Femara from Feb 2006 thru June 2011. She was also on TEACH trial for ~ 9 months, was on lapatinib arm and came off at her request due to rash. Family history is negative for other breast cancer. Her daughter is now 40 and patient is interested in at least talking with genetics counselor, which we will set up at [facility].  Patient has been doing well since she was here last, with no complaints that seem referable to history of breast cancer or that treatment. She takes calcium and supplemental vitamin D occasionally and we have discussed; she is not exercising regularly and we have discussed this also. She denies HA or other neurologic symptoms, shortness of breath or cough, bleeding, new or different pain. She is not aware of any changes on breast self exam and is not uncomfortable with the residual lymphedema right breast.   Remainder of 10 point Review of Systems negative.  Objective:  Vital signs in last 24 hours:  BP 130/76  Pulse 76  Temp 97 F (36.1 C) (Oral)  Wt 146 lb  14.4 oz (66.633 kg). Weight is up 15 lbs from last year. Alert, looks comfortable.   HEENT:PERRLA, sclera clear, anicteric, oropharynx clear, no lesions and neck supple with midline trachea LymphaticsCervical, supraclavicular, and axillary nodes normal.No inguinal adenopathy Resp: clear to auscultation bilaterally and normal percussion bilaterally Cardio: regular rate and rhythm GI: soft, non-tender; bowel sounds normal; no masses,  no organomegaly Extremities: extremities normal, atraumatic, no cyanosis or edema Neuro:nonfocal Breasts: right with well healed lumpectomy scar, mild lymphedema especially medial and upper breast but softer laterally than a few years ago. No dominant mass, no skin or nipple findings of concern and right axilla benign. Left breast unremarkable, left axilla likewise.  Lab Results:  Results for orders placed in visit on 01/28/12  CBC WITH DIFFERENTIAL      Component Value Range   WBC 5.5  3.9 - 10.3 10e3/uL   NEUT# 3.7  1.5 - 6.5 10e3/uL   HGB 11.8  11.6 - 15.9 g/dL   HCT 13.2  44.0 - 10.2 %   Platelets 297  145 - 400 10e3/uL   MCV 82.2  79.5 - 101.0 fL   MCH 27.8  25.1 - 34.0 pg   MCHC 33.9  31.5 - 36.0 g/dL   RBC 7.25  3.66 - 4.40 10e6/uL   RDW 14.1  11.2 - 14.5 %   lymph# 1.1  0.9 - 3.3 10e3/uL   MONO# 0.6  0.1 - 0.9 10e3/uL   Eosinophils Absolute 0.1  0.0 - 0.5 10e3/uL   Basophils Absolute 0.1  0.0 - 0.1 10e3/uL   NEUT% 66.6  38.4 - 76.8 %   LYMPH% 20.1  14.0 - 49.7 %   MONO% 10.0  0.0 - 14.0 %   EOS% 2.2  0.0 - 7.0 %   BASO% 1.1  0.0 - 2.0 %     Studies/Results:  No results found.  Medications: I have reviewed the patient's current medications.  Assessment/Plan: 1. T1N0  ER/PR/HER-2 positive right breast cancer: treatment as above, now on observation. She prefers yearly follow up at this office, which is appropriate with her history. She will have mammograms march 2014. Referral to genetics counseling. 2.osteopenia by bone density scan  2010: encouraged exercise, Calcium and D  Patient was comfortable with discussion and plan as above.  LIVESAY,LENNIS P, MD   01/28/2012, 10:58 AM

## 2012-01-28 NOTE — Patient Instructions (Signed)
Call if you have not heard from schedulers re appointments including genetics counseling by next week, please call back to schedulers [phone].

## 2012-01-29 ENCOUNTER — Telehealth: Payer: Self-pay | Admitting: *Deleted

## 2012-01-29 NOTE — Telephone Encounter (Signed)
made patient appointment for mammogram at the breast center on 10-04-2012 at 12:30pm made patient appointment for 01-25-2013 starting at 11:00am made patient apppointment for genetic counseling on 03-18-2012 starting at 2:00pm

## 2012-02-24 ENCOUNTER — Ambulatory Visit: Payer: Managed Care, Other (non HMO) | Admitting: Oncology

## 2012-02-26 ENCOUNTER — Telehealth: Payer: Self-pay | Admitting: Oncology

## 2012-02-26 NOTE — Telephone Encounter (Signed)
Returned pt's call and confirmed appts for 8/29 genetics, 10/04/12 mammo, and 01/25/13 lb/LL.

## 2012-03-11 ENCOUNTER — Telehealth: Payer: Self-pay | Admitting: *Deleted

## 2012-03-11 NOTE — Telephone Encounter (Signed)
Pt called and left vm stating to cancel her genetics appt.  Informed Dr. Darrold Span of pt request to cancel genetics appt.  Left my contact information to return call if she wishes to r/s her appt.

## 2012-03-18 ENCOUNTER — Encounter: Payer: Managed Care, Other (non HMO) | Admitting: Genetic Counselor

## 2012-03-18 ENCOUNTER — Other Ambulatory Visit: Payer: Managed Care, Other (non HMO) | Admitting: Lab

## 2012-06-10 ENCOUNTER — Other Ambulatory Visit: Payer: Self-pay | Admitting: Physician Assistant

## 2012-06-10 ENCOUNTER — Other Ambulatory Visit (HOSPITAL_COMMUNITY)
Admission: RE | Admit: 2012-06-10 | Discharge: 2012-06-10 | Disposition: A | Discharge status: Home / Self Care | Payer: Managed Care, Other (non HMO) | Source: Ambulatory Visit | Attending: Family Medicine | Admitting: Family Medicine

## 2012-06-10 DIAGNOSIS — Z0000 Encounter for general adult medical examination without abnormal findings: Secondary | ICD-10-CM | POA: Insufficient documentation

## 2012-10-04 ENCOUNTER — Ambulatory Visit
Admission: RE | Admit: 2012-10-04 | Discharge: 2012-10-04 | Disposition: A | Discharge status: Home / Self Care | Payer: Managed Care, Other (non HMO) | Source: Ambulatory Visit | Attending: Oncology | Admitting: Oncology

## 2012-10-04 DIAGNOSIS — Z1231 Encounter for screening mammogram for malignant neoplasm of breast: Secondary | ICD-10-CM

## 2012-12-31 ENCOUNTER — Telehealth: Payer: Self-pay | Admitting: Oncology

## 2012-12-31 NOTE — Telephone Encounter (Signed)
Moved 7/8 appt to covering provider due to LL's departure. S/w pt re new time for lb/fu 7/8 @ 3pm.

## 2013-01-25 ENCOUNTER — Other Ambulatory Visit (HOSPITAL_BASED_OUTPATIENT_CLINIC_OR_DEPARTMENT_OTHER): Payer: Managed Care, Other (non HMO) | Admitting: Lab

## 2013-01-25 ENCOUNTER — Telehealth: Payer: Self-pay | Admitting: Hematology and Oncology

## 2013-01-25 ENCOUNTER — Ambulatory Visit (HOSPITAL_BASED_OUTPATIENT_CLINIC_OR_DEPARTMENT_OTHER): Payer: Managed Care, Other (non HMO) | Admitting: Hematology and Oncology

## 2013-01-25 VITALS — BP 132/62 | HR 83 | Temp 98.3°F | Resp 19 | Ht 63.0 in | Wt 153.5 lb

## 2013-01-25 DIAGNOSIS — M899 Disorder of bone, unspecified: Secondary | ICD-10-CM

## 2013-01-25 DIAGNOSIS — Z853 Personal history of malignant neoplasm of breast: Secondary | ICD-10-CM

## 2013-01-25 DIAGNOSIS — C50911 Malignant neoplasm of unspecified site of right female breast: Secondary | ICD-10-CM

## 2013-01-25 DIAGNOSIS — C50919 Malignant neoplasm of unspecified site of unspecified female breast: Secondary | ICD-10-CM

## 2013-01-25 LAB — COMPREHENSIVE METABOLIC PANEL (CC13)
AST: 24 U/L (ref 5–34)
Alkaline Phosphatase: 83 U/L (ref 40–150)
BUN: 13.6 mg/dL (ref 7.0–26.0)
Creatinine: 0.8 mg/dL (ref 0.6–1.1)
Total Bilirubin: 0.26 mg/dL (ref 0.20–1.20)

## 2013-01-25 LAB — CBC WITH DIFFERENTIAL/PLATELET
Basophils Absolute: 0 10*3/uL (ref 0.0–0.1)
EOS%: 2.5 % (ref 0.0–7.0)
LYMPH%: 31 % (ref 14.0–49.7)
MCH: 28.4 pg (ref 25.1–34.0)
MCV: 82.4 fL (ref 79.5–101.0)
MONO%: 8.3 % (ref 0.0–14.0)
NEUT#: 2.4 10*3/uL (ref 1.5–6.5)
NEUT%: 57 % (ref 38.4–76.8)
RDW: 14.3 % (ref 11.2–14.5)
WBC: 4.3 10*3/uL (ref 3.9–10.3)

## 2013-01-25 NOTE — Progress Notes (Signed)
OFFICE PROGRESS NOTE   01/25/2013   Physicians: Deboraha Sprang at [facility], K.Richardson  INTERVAL HISTORY:  Patient is seen, alone for visit, in scheduled yearly follow up of her history of right breast cancer. She is followed on observation thru this office, and after this visit does not need follow up from standpoint of TEACH trial. Last mammograms were at [facility] 10-04-12, with no mammographic findings of concern. Note patient is extremely claustrophobic so has not had routine breast MRIs.  History is of T1N0 right breast cancer which was low ER/PR poistive and Her-2 positive by FISH at lumpectomy with 3 sentinel node evaluation in April 2004 in [place], [place]. The breast cancer was found on routine mammogram., and patient was possibly perimenopausal at age 42 at that diagnosis.  She was treated with adjuvant adriamycin/cytoxan, local radiation and tamoxifen from Nov 2004 thru Feb 2006 then Femara from Feb 2006 thru June 2011. She was also on TEACH trial for ~ 9 months, was on lapatinib arm and came off at her request due to rash. Family history is negative for other breast cancer. Her daughter is now 29 and patient is interested in at least talking with genetics counselor, which we will set up at [facility].  Patient has been doing well since she was here last, with no complaints that seem referable to history of breast cancer or that treatment. She takes calcium and supplemental vitamin D occasionally and we have discussed; she is not exercising regularly and we have discussed this also. She denies HA or other neurologic symptoms, shortness of breath or cough, bleeding, new or different pain. She is not aware of any changes on breast self exam and is not uncomfortable with the residual lymphedema right breast.   Remainder of 10 point Review of Systems negative.  Objective:  Vital signs in last 24 hours:  BP 132/62  Pulse 83  Temp(Src) 98.3 F (36.8 C) (Oral)  Resp 19  Ht 5\' 3"  (1.6 m)  Wt 153 lb 8  oz (69.627 kg)  BMI 27.2 kg/m2. Weight is up 15 lbs from last year. Alert, looks comfortable.   HEENT:PERRLA, sclera clear, anicteric, oropharynx clear, no lesions and neck supple with midline trachea LymphaticsCervical, supraclavicular, and axillary nodes normal.No inguinal adenopathy Resp: clear to auscultation bilaterally and normal percussion bilaterally Cardio: regular rate and rhythm GI: soft, non-tender; bowel sounds normal; no masses,  no organomegaly Extremities: extremities normal, atraumatic, no cyanosis or edema Neuro:nonfocal Breasts: right with well healed lumpectomy scar, mild lymphedema especially medial and upper breast but softer laterally than a few years ago. No dominant mass, no skin or nipple findings of concern and right axilla benign. Left breast unremarkable, left axilla likewise.  Lab Results:  Results for orders placed in visit on 01/25/13  CBC WITH DIFFERENTIAL      Result Value Range   WBC 4.3  3.9 - 10.3 10e3/uL   NEUT# 2.4  1.5 - 6.5 10e3/uL   HGB 12.4  11.6 - 15.9 g/dL   HCT 46.9  62.9 - 52.8 %   Platelets 315  145 - 400 10e3/uL   MCV 82.4  79.5 - 101.0 fL   MCH 28.4  25.1 - 34.0 pg   MCHC 34.5  31.5 - 36.0 g/dL   RBC 4.13  2.44 - 0.10 10e6/uL   RDW 14.3  11.2 - 14.5 %   lymph# 1.3  0.9 - 3.3 10e3/uL   MONO# 0.4  0.1 - 0.9 10e3/uL   Eosinophils Absolute 0.1  0.0 - 0.5 10e3/uL   Basophils Absolute 0.0  0.0 - 0.1 10e3/uL   NEUT% 57.0  38.4 - 76.8 %   LYMPH% 31.0  14.0 - 49.7 %   MONO% 8.3  0.0 - 14.0 %   EOS% 2.5  0.0 - 7.0 %   BASO% 1.2  0.0 - 2.0 %     Studies/Results:  No results found.  Medications: I have reviewed the patient's current medications.  Assessment/Plan: 1. T1N0  ER/PR/HER-2 positive right breast cancer: treatment as above, now on observation. She prefers yearly follow up at this office, which is appropriate with her history. She will have mammograms on the right side on September of 2014 and bil on march 2015. Referral to  genetics counseling. 2.osteopenia by bone density scan 2010 and 2012: encouraged exercise, Calcium and D  Patient was comfortable with discussion and plan as above.  Zachery Dakins, MD  01/25/2013, 3:29 PM

## 2013-01-25 NOTE — Telephone Encounter (Signed)
gv and printed appt sched and avs for pt....pt sched for March 18.14 @ 3:30pm for annual mammo....for unilater mammo the radiologiest at [facility] stated it was not needed unless the pt was having a problem and the pt did not want to pay if it was not neededd

## 2013-04-14 ENCOUNTER — Encounter: Payer: Self-pay | Admitting: Internal Medicine

## 2013-08-26 ENCOUNTER — Other Ambulatory Visit: Payer: Self-pay

## 2013-08-26 DIAGNOSIS — C50919 Malignant neoplasm of unspecified site of unspecified female breast: Secondary | ICD-10-CM

## 2013-08-26 NOTE — Progress Notes (Signed)
Pt lvm at 830 am asking if she could have bone density test. It had been 2 yrs since last test. S/w [facility] at [facility]. They were going to ask their PA if he would order it. No order by 430 pm and Dr Juliann Mule stated he would order it b/c pt has a hx of taking tamoxifen and femara. LVM with pt that it is ordered.

## 2013-08-29 ENCOUNTER — Other Ambulatory Visit: Payer: Self-pay | Admitting: Medical Oncology

## 2013-08-29 ENCOUNTER — Telehealth: Payer: Self-pay | Admitting: *Deleted

## 2013-08-29 NOTE — Telephone Encounter (Signed)
Lm informed that on 10/05/13 she will have a bone dens the same day as her mammo. gv appt for 10/05/13 @ 2:45p. Made the pt aware that i will mail a letter/avs...td

## 2013-10-05 ENCOUNTER — Ambulatory Visit
Admission: RE | Admit: 2013-10-05 | Discharge: 2013-10-05 | Disposition: A | Discharge status: Home / Self Care | Payer: Managed Care, Other (non HMO) | Source: Ambulatory Visit | Attending: Hematology and Oncology | Admitting: Hematology and Oncology

## 2013-10-05 ENCOUNTER — Ambulatory Visit
Admission: RE | Admit: 2013-10-05 | Discharge: 2013-10-05 | Disposition: A | Discharge status: Home / Self Care | Payer: Managed Care, Other (non HMO) | Source: Ambulatory Visit | Attending: Internal Medicine | Admitting: Internal Medicine

## 2013-10-05 DIAGNOSIS — C50911 Malignant neoplasm of unspecified site of right female breast: Secondary | ICD-10-CM

## 2013-10-05 DIAGNOSIS — C50919 Malignant neoplasm of unspecified site of unspecified female breast: Secondary | ICD-10-CM

## 2014-01-02 ENCOUNTER — Telehealth: Payer: Self-pay | Admitting: Internal Medicine

## 2014-01-02 NOTE — Telephone Encounter (Signed)
s.w. pt and advised on 7.8 appt changed to 7.13 due to MD on pal...pt ok adn aware

## 2014-01-25 ENCOUNTER — Ambulatory Visit: Payer: Managed Care, Other (non HMO)

## 2014-01-30 ENCOUNTER — Telehealth: Payer: Self-pay | Admitting: Internal Medicine

## 2014-01-30 ENCOUNTER — Ambulatory Visit: Payer: Managed Care, Other (non HMO)

## 2014-01-30 ENCOUNTER — Other Ambulatory Visit (HOSPITAL_BASED_OUTPATIENT_CLINIC_OR_DEPARTMENT_OTHER): Payer: Managed Care, Other (non HMO)

## 2014-01-30 ENCOUNTER — Ambulatory Visit (HOSPITAL_BASED_OUTPATIENT_CLINIC_OR_DEPARTMENT_OTHER): Payer: Managed Care, Other (non HMO) | Admitting: Internal Medicine

## 2014-01-30 VITALS — BP 135/80 | HR 73 | Temp 98.3°F | Resp 18 | Ht 63.0 in | Wt 152.3 lb

## 2014-01-30 DIAGNOSIS — Z853 Personal history of malignant neoplasm of breast: Secondary | ICD-10-CM

## 2014-01-30 DIAGNOSIS — M949 Disorder of cartilage, unspecified: Secondary | ICD-10-CM

## 2014-01-30 DIAGNOSIS — M899 Disorder of bone, unspecified: Secondary | ICD-10-CM

## 2014-01-30 DIAGNOSIS — C50919 Malignant neoplasm of unspecified site of unspecified female breast: Secondary | ICD-10-CM

## 2014-01-30 LAB — CBC WITH DIFFERENTIAL/PLATELET
BASO%: 0.6 % (ref 0.0–2.0)
Basophils Absolute: 0 10*3/uL (ref 0.0–0.1)
EOS%: 2.7 % (ref 0.0–7.0)
Eosinophils Absolute: 0.1 10*3/uL (ref 0.0–0.5)
HCT: 39.2 % (ref 34.8–46.6)
HGB: 13.1 g/dL (ref 11.6–15.9)
LYMPH#: 1.6 10*3/uL (ref 0.9–3.3)
LYMPH%: 33.7 % (ref 14.0–49.7)
MCH: 27.4 pg (ref 25.1–34.0)
MCHC: 33.4 g/dL (ref 31.5–36.0)
MCV: 82 fL (ref 79.5–101.0)
MONO#: 0.4 10*3/uL (ref 0.1–0.9)
MONO%: 8.3 % (ref 0.0–14.0)
NEUT#: 2.6 10*3/uL (ref 1.5–6.5)
NEUT%: 54.7 % (ref 38.4–76.8)
Platelets: 282 10*3/uL (ref 145–400)
RBC: 4.78 10*6/uL (ref 3.70–5.45)
RDW: 13.9 % (ref 11.2–14.5)
WBC: 4.8 10*3/uL (ref 3.9–10.3)

## 2014-01-30 LAB — COMPREHENSIVE METABOLIC PANEL (CC13)
ALT: 33 U/L (ref 0–55)
AST: 23 U/L (ref 5–34)
Albumin: 4.3 g/dL (ref 3.5–5.0)
Alkaline Phosphatase: 89 U/L (ref 40–150)
Anion Gap: 10 mEq/L (ref 3–11)
BUN: 12 mg/dL (ref 7.0–26.0)
CALCIUM: 9.7 mg/dL (ref 8.4–10.4)
CHLORIDE: 106 meq/L (ref 98–109)
CO2: 21 meq/L — AB (ref 22–29)
Creatinine: 0.8 mg/dL (ref 0.6–1.1)
Glucose: 93 mg/dl (ref 70–140)
Potassium: 4.6 mEq/L (ref 3.5–5.1)
SODIUM: 138 meq/L (ref 136–145)
TOTAL PROTEIN: 7.8 g/dL (ref 6.4–8.3)
Total Bilirubin: 0.41 mg/dL (ref 0.20–1.20)

## 2014-01-30 NOTE — Progress Notes (Signed)
[facility] OFFICE PROGRESS NOTE  No primary provider on file. No primary provider on file.  DIAGNOSIS: Breast cancer, unspecified laterality - Plan: CBC with Differential, Comprehensive metabolic panel (Cmet) - [facility], CBC with Differential, Comprehensive metabolic panel (Cmet) - [facility]  Chief Complaint  Patient presents with  . Breast cancer, right    CURRENT TREATMENT: Observation.  INTERVAL HISTORY: [name] 58 y.o. female   is seen, alone for visit, in scheduled yearly follow up of her history of right breast cancer. She is followed on observation thru this office. Last mammograms were at [facility] 10-05-13, with no mammographic findings of concern. Note patient is extremely claustrophobic so has not had routine breast MRIs.    Patient has been doing well since she was here last, with no complaints that seem referable to history of breast cancer or that treatment. She takes calcium and supplemental vitamin D occasionally and we have discussed; she is not exercising regularly and we have discussed this also. She denies HA or other neurologic symptoms, shortness of breath or cough, bleeding, new or different pain. She is not aware of any changes on breast self exam and is not uncomfortable with the residual lymphedema right breast.   Remainder of 10 point Review of Systems negative.  MEDICAL HISTORY: Past Medical History  Diagnosis Date  . Cancer April 2004    right breast lumpectomy    INTERIM HISTORY: has Breast cancer on her problem list.   History is of T1N0 right breast cancer which was low ER/PR poistive and Her-2 positive by FISH at lumpectomy with 3 sentinel node evaluation in April 2004 in [place], [place]. The breast cancer was found on routine mammogram., and patient was possibly perimenopausal at age 44 at that diagnosis. She was treated with adjuvant adriamycin/cytoxan, local radiation and tamoxifen from Nov 2004 thru Feb 2006 then Femara from Feb 2006  thru June 2011. She was also on TEACH trial for ~ 9 months, was on lapatinib arm and came off at her request due to rash.  Family history is negative for other breast cancer. Her daughter is now 26 and patient is interested in at least talking with genetics counselor, which we will set up at [facility].   ALLERGIES:  has No Known Allergies.  MEDICATIONS: currently has no medications in their medication list.  SURGICAL HISTORY:  Past Surgical History  Procedure Laterality Date  . Breast surgery  10/2002    lumpectomy    REVIEW OF SYSTEMS:   Constitutional: Denies fevers, chills or abnormal weight loss Eyes: Denies blurriness of vision Ears, nose, mouth, throat, and face: Denies mucositis or sore throat Respiratory: Denies cough, dyspnea or wheezes Cardiovascular: Denies palpitation, chest discomfort or lower extremity swelling Gastrointestinal:  Denies nausea, heartburn or change in bowel habits Skin: Denies abnormal skin rashes Lymphatics: Denies new lymphadenopathy or easy bruising Neurological:Denies numbness, tingling or new weaknesses Behavioral/Psych: Mood is stable, no new changes  All other systems were reviewed with the patient and are negative.  PHYSICAL EXAMINATION: ECOG PERFORMANCE STATUS: 0 - Asymptomatic  Blood pressure 135/80, pulse 73, temperature 98.3 F (36.8 C), temperature source Oral, resp. rate 18, height 5' 3"  (1.6 m), weight 152 lb 4.8 oz (69.083 kg), SpO2 100.00%.  GENERAL:alert, no distress and comfortable; well developed and well nourished SKIN: skin color, texture, turgor are normal, no rashes or significant lesions EYES: normal, Conjunctiva are pink and non-injected, sclera clear OROPHARYNX:no exudate, no erythema and lips, buccal mucosa, and tongue normal  NECK: supple, thyroid normal size, non-tender, without nodularity LYMPH:  no palpable lymphadenopathy in the cervical, axillary or supraclavicular LUNGS: clear to auscultation with normal breathing  effort, no wheezes or rhonchi HEART: regular rate & rhythm and no murmurs and no lower extremity edema ABDOMEN:abdomen soft, non-tender and normal bowel sounds Breasts: right with well healed lumpectomy scar, mild lymphedema especially medial and upper breast but softer laterally than a few years ago. No dominant mass, no skin or nipple findings of concern and right axilla benign. Left breast unremarkable, left axilla benign. Musculoskeletal:no cyanosis of digits and no clubbing  NEURO: alert & oriented x 3 with fluent speech, no focal motor/sensory deficits  Labs:  Lab Results  Component Value Date   WBC 4.8 01/30/2014   HGB 13.1 01/30/2014   HCT 39.2 01/30/2014   MCV 82.0 01/30/2014   PLT 282 01/30/2014   NEUTROABS 2.6 01/30/2014      Chemistry      Component Value Date/Time   NA 143 01/25/2013 1509   NA 140 01/28/2012 0956   K 3.8 01/25/2013 1509   K 4.1 01/28/2012 0956   CL 104 01/28/2012 0956   CO2 30* 01/25/2013 1509   CO2 26 01/28/2012 0956   BUN 13.6 01/25/2013 1509   BUN 12 01/28/2012 0956   CREATININE 0.8 01/25/2013 1509   CREATININE 0.78 01/28/2012 0956      Component Value Date/Time   CALCIUM 9.5 01/25/2013 1509   CALCIUM 9.1 01/28/2012 0956   ALKPHOS 83 01/25/2013 1509   ALKPHOS 87 01/28/2012 0956   AST 24 01/25/2013 1509   AST 18 01/28/2012 0956   ALT 29 01/25/2013 1509   ALT 18 01/28/2012 0956   BILITOT 0.26 01/25/2013 1509   BILITOT 0.5 01/28/2012 0956       Basic Metabolic Panel: No results found for this basename: NA, K, CL, CO2, GLUCOSE, BUN, CREATININE, CALCIUM, MG, PHOS,  in the last 168 hours GFR Estimated Creatinine Clearance: 71.5 ml/min (by C-G formula based on Cr of 0.8). Liver Function Tests: No results found for this basename: AST, ALT, ALKPHOS, BILITOT, PROT, ALBUMIN,  in the last 168 hours No results found for this basename: LIPASE, AMYLASE,  in the last 168 hours No results found for this basename: AMMONIA,  in the last 168 hours Coagulation profile No results found  for this basename: INR, PROTIME,  in the last 168 hours  CBC:  Recent Labs Lab 01/30/14 1613  WBC 4.8  NEUTROABS 2.6  HGB 13.1  HCT 39.2  MCV 82.0  PLT 282    Anemia work up No results found for this basename: VITAMINB12, FOLATE, FERRITIN, TIBC, IRON, RETICCTPCT,  in the last 72 hours  Studies:  No results found.   RADIOGRAPHIC STUDIES: 10/05/2013  MM Digital Screeing DIGITAL SCREENING BILATERAL MAMMOGRAM WITH CAD COMPARISON: Previous exam(s). ACR Breast Density Category b: There are scattered areas of  fibroglandular density. FINDINGS: There are no findings suspicious for malignancy. Images were processed with CAD. IMPRESSION: No mammographic evidence of malignancy. A result letter of this screening mammogram will be mailed directly to the patient. RECOMMENDATION:  Screening mammogram in one year. (Code:SM-B-01Y)  BI-RADS CATEGORY 1: Negative.   ASSESSMENT: Ned Card 58 y.o. female with a history of Breast cancer, unspecified laterality - Plan: CBC with Differential, Comprehensive metabolic panel (Cmet) - [facility], CBC with Differential, Comprehensive metabolic panel (Cmet) - [facility]   PLAN:   1. T1N0 ER/PR/HER-2 positive right breast cancer: treatment as above, now on observation.  She prefers yearly follow up at this office, which is appropriate with her history. She will have mammograms on the right side on September of 2015 and bilateral on march 2016.   2.osteopenia by bone density scan 2010 and 2012: encouraged exercise, Calcium and D   All questions were answered. The patient knows to call the clinic with any problems, questions or concerns. We can certainly see the patient much sooner if necessary.  I spent 15 minutes counseling the patient face to face. The total time spent in the appointment was 25 minutes.    Rettie Laird, MD 01/30/2014 4:52 PM

## 2014-01-30 NOTE — Telephone Encounter (Signed)
Per 07/13 POF Dr. Juliann Mule has req pt to see Dr. Marko Plume for her yearly but there is not a schedule, sent a msg to Dr. Marko Plume concerning this matter w/Dr's schedule for next year. Advised pt I would call her once I get a schedule, gave pt AVS...Marland KitchenMarland KitchenKJ

## 2014-02-07 ENCOUNTER — Telehealth: Payer: Self-pay | Admitting: Internal Medicine

## 2014-05-10 ENCOUNTER — Other Ambulatory Visit: Payer: Self-pay

## 2014-05-10 ENCOUNTER — Telehealth: Payer: Self-pay | Admitting: Oncology

## 2014-05-10 DIAGNOSIS — Z1231 Encounter for screening mammogram for malignant neoplasm of breast: Secondary | ICD-10-CM

## 2014-05-10 NOTE — Telephone Encounter (Signed)
pt cld to req her July appt be sch w/LL-printed and gave to [facility] for sch-pt understtod-adv we will call w/appt

## 2014-07-21 HISTORY — PX: COLONOSCOPY: SHX174

## 2014-09-08 ENCOUNTER — Telehealth: Payer: Self-pay | Admitting: Oncology

## 2014-09-08 NOTE — Telephone Encounter (Signed)
, °

## 2014-10-13 ENCOUNTER — Ambulatory Visit
Admission: RE | Admit: 2014-10-13 | Discharge: 2014-10-13 | Disposition: A | Discharge status: Home / Self Care | Payer: Managed Care, Other (non HMO) | Source: Ambulatory Visit

## 2014-10-13 DIAGNOSIS — Z1231 Encounter for screening mammogram for malignant neoplasm of breast: Secondary | ICD-10-CM

## 2015-01-31 ENCOUNTER — Other Ambulatory Visit: Payer: Self-pay | Admitting: Oncology

## 2015-01-31 DIAGNOSIS — C50911 Malignant neoplasm of unspecified site of right female breast: Secondary | ICD-10-CM

## 2015-02-01 ENCOUNTER — Other Ambulatory Visit: Payer: Self-pay | Admitting: Oncology

## 2015-02-01 ENCOUNTER — Ambulatory Visit (HOSPITAL_BASED_OUTPATIENT_CLINIC_OR_DEPARTMENT_OTHER): Payer: Managed Care, Other (non HMO) | Admitting: Oncology

## 2015-02-01 ENCOUNTER — Encounter: Payer: Self-pay | Admitting: Oncology

## 2015-02-01 ENCOUNTER — Other Ambulatory Visit (HOSPITAL_BASED_OUTPATIENT_CLINIC_OR_DEPARTMENT_OTHER): Payer: Managed Care, Other (non HMO)

## 2015-02-01 ENCOUNTER — Telehealth: Payer: Self-pay | Admitting: Oncology

## 2015-02-01 VITALS — BP 133/74 | HR 91 | Temp 98.1°F | Resp 18 | Ht 63.0 in | Wt 157.6 lb

## 2015-02-01 DIAGNOSIS — C50911 Malignant neoplasm of unspecified site of right female breast: Secondary | ICD-10-CM

## 2015-02-01 DIAGNOSIS — Z853 Personal history of malignant neoplasm of breast: Secondary | ICD-10-CM

## 2015-02-01 DIAGNOSIS — M8580 Other specified disorders of bone density and structure, unspecified site: Secondary | ICD-10-CM | POA: Diagnosis not present

## 2015-02-01 DIAGNOSIS — Z1231 Encounter for screening mammogram for malignant neoplasm of breast: Secondary | ICD-10-CM

## 2015-02-01 DIAGNOSIS — M859 Disorder of bone density and structure, unspecified: Secondary | ICD-10-CM

## 2015-02-01 DIAGNOSIS — E2839 Other primary ovarian failure: Secondary | ICD-10-CM

## 2015-02-01 DIAGNOSIS — D225 Melanocytic nevi of trunk: Secondary | ICD-10-CM

## 2015-02-01 LAB — CBC WITH DIFFERENTIAL/PLATELET
BASO%: 1.1 % (ref 0.0–2.0)
Basophils Absolute: 0 10*3/uL (ref 0.0–0.1)
EOS ABS: 0.2 10*3/uL (ref 0.0–0.5)
EOS%: 3.5 % (ref 0.0–7.0)
HEMATOCRIT: 37.9 % (ref 34.8–46.6)
HGB: 12.6 g/dL (ref 11.6–15.9)
LYMPH%: 29 % (ref 14.0–49.7)
MCH: 27.7 pg (ref 25.1–34.0)
MCHC: 33.3 g/dL (ref 31.5–36.0)
MCV: 83.2 fL (ref 79.5–101.0)
MONO#: 0.4 10*3/uL (ref 0.1–0.9)
MONO%: 9.5 % (ref 0.0–14.0)
NEUT%: 56.9 % (ref 38.4–76.8)
NEUTROS ABS: 2.5 10*3/uL (ref 1.5–6.5)
PLATELETS: 324 10*3/uL (ref 145–400)
RBC: 4.55 10*6/uL (ref 3.70–5.45)
RDW: 15.1 % — AB (ref 11.2–14.5)
WBC: 4.5 10*3/uL (ref 3.9–10.3)
lymph#: 1.3 10*3/uL (ref 0.9–3.3)

## 2015-02-01 LAB — COMPREHENSIVE METABOLIC PANEL (CC13)
ALT: 48 U/L (ref 0–55)
ANION GAP: 10 meq/L (ref 3–11)
AST: 28 U/L (ref 5–34)
Albumin: 4 g/dL (ref 3.5–5.0)
Alkaline Phosphatase: 107 U/L (ref 40–150)
BILIRUBIN TOTAL: 0.32 mg/dL (ref 0.20–1.20)
BUN: 9.9 mg/dL (ref 7.0–26.0)
CO2: 28 meq/L (ref 22–29)
CREATININE: 0.8 mg/dL (ref 0.6–1.1)
Calcium: 9.6 mg/dL (ref 8.4–10.4)
Chloride: 106 mEq/L (ref 98–109)
EGFR: 77 mL/min/{1.73_m2} — ABNORMAL LOW (ref >90)
Glucose: 126 mg/dl (ref 70–140)
POTASSIUM: 3.7 meq/L (ref 3.5–5.1)
SODIUM: 143 meq/L (ref 136–145)
Total Protein: 7.4 g/dL (ref 6.4–8.3)

## 2015-02-01 NOTE — Telephone Encounter (Signed)
Gave avs & calendar for August. Left message at Dr. Tonia Brooms  Office to schedule for referral. Sent message to MD to change order for Bone density per [facility] incorrect diagnosis. Will schedule one year appointment once schedule available.

## 2015-02-01 NOTE — Progress Notes (Signed)
OFFICE PROGRESS NOTE   February 01, 2015   Physicians: ([facility]) (K.Richardson)  INTERVAL HISTORY:  Patient is seen, for first time back by this MD since 01-2012, in interim seen at this office by locums tenens and by Dr D. Chism, in yearly follow up of right breast cancer for which she has been on observation since completing adjuvant treatment in June 2011 (+ TEACH trial with lapatinib).  Most [facility], with breast tissue almost entirely fatty and no mammographic findings of concern.  Patient does not have active PCP; she is overdue gyn exam. Last physical exam was within past year thru her work. She had colonoscopy by Dr Dora Brodie ~5- 2012. She had DEXA 09-2013 with lowest T score -1.5 in lumbar spine, with femur -0.5.  Patient has felt entirely well with exception of weight gain in past year related to extremely time-consuming project at her work, and no exercise since prior to start of that work project. Her father died of metastatic lung cancer in 10-2014, long time smoker, diagnosis only shortly before he died.  She denies any changes noted in breasts. She has GERD which she feels is related to the weight gain. She denies SOB, new or different pain, change in bowels, abdominal or pelvic pain, any bleeding.   No central catheter. Has not had genetics counseling, but would like that per our discussion now. Referral made now.  ONCOLOGIC HISTORY History is of T1N0 right breast cancer which was low ER/PR poistive and Her-2 positive by FISH at lumpectomy with 3 sentinel node evaluation in April 2004 in [place], [place]. The breast cancer was found on routine mammogram., and patient was possibly perimenopausal at age 59 at that diagnosis. She was treated with adjuvant adriamycin/cytoxan, local radiation and tamoxifen from Nov 2004 thru Feb 2006 then Femara from Feb 2006 thru June 2011. She was also on TEACH trial for ~ 9 months, was on  lapatinib arm and came off at her request due to rash.    Review of systems as above, also: No recent infectious illness. No bladder symptoms. No LE swelling.  Remainder of 10 point Review of Systems negative.  Objective:  Vital signs in last 24 hours:  BP 133/74 mmHg  Pulse 91  Temp(Src) 98.1 F (36.7 C) (Oral)  Resp 18  Ht 5' 3" (1.6 m)  Wt 157 lb 9.6 oz (71.487 kg)  BMI 27.92 kg/m2 Weight was 148 in 01-2012 and 152 in 01-2014. Alert, oriented and appropriate, looks comfortable, good historian. Ambulatory without  difficulty.    HEENT:PERRL, sclerae not icteric. Oral mucosa moist without lesions, posterior pharynx clear.  Neck supple. No JVD.  Lymphatics:no cervical,supraclavicular, axillary or inguinal adenopathy Resp: clear to auscultation bilaterally and normal percussion bilaterally Cardio: regular rate and rhythm. No gallop. GI: soft, nontender, not distended, no mass or organomegaly. Normally active bowel sounds.  Musculoskeletal/ Extremities: without pitting edema, cords, tenderness Neuro: nonfocal. PSYCH appropriate mood and affect Skin without rash, ecchymosis, petechiae. Dark, oval nevus ~ 1.5 x 2.5 cm over lower lumbar spine, slightly irregular borders and some color variation. Breasts: RIght with slight texture difference thruout consistent with radiation, well healed lumpectomy scar, otherwise bilaterally without dominant mass, skin or nipple findings of concern. Axillae benign.   Lab Results:  Results for orders placed or performed in visit on 02/01/15  CBC with Differential  Result Value Ref Range   WBC 4.5 3.9 - 10.3 10e3/uL   NEUT# 2.5   1.5 - 6.5 10e3/uL   HGB 12.6 11.6 - 15.9 g/dL   HCT 37.9 34.8 - 46.6 %   Platelets 324 145 - 400 10e3/uL   MCV 83.2 79.5 - 101.0 fL   MCH 27.7 25.1 - 34.0 pg   MCHC 33.3 31.5 - 36.0 g/dL   RBC 4.55 3.70 - 5.45 10e6/uL   RDW 15.1 (H) 11.2 - 14.5 %   lymph# 1.3 0.9 - 3.3 10e3/uL   MONO# 0.4 0.1 - 0.9 10e3/uL    Eosinophils Absolute 0.2 0.0 - 0.5 10e3/uL   Basophils Absolute 0.0 0.0 - 0.1 10e3/uL   NEUT% 56.9 38.4 - 76.8 %   LYMPH% 29.0 14.0 - 49.7 %   MONO% 9.5 0.0 - 14.0 %   EOS% 3.5 0.0 - 7.0 %   BASO% 1.1 0.0 - 2.0 %   CMET available after visit entirely normal  Studies/Results: DIGITAL SCREENING BILATERAL MAMMOGRAM WITH CAD    [facility]   COMPARISON: Previous exam(s).  ACR Breast Density Category a: The breast tissue is almost entirely fatty.  FINDINGS: There are no findings suspicious for malignancy. Images were processed with CAD. Right lumpectomy changes are reidentified.  IMPRESSION: No mammographic evidence of malignancy. A result letter of this screening mammogram will be mailed directly to the patient.  RECOMMENDATION: Screening mammogram in one year. (Code:SM-B-01Y)  BI-RADS CATEGORY 2: Benign.  Medications: I have reviewed the patient's current medications, only nexium OTC  DISCUSSION Needs to establish with PCP, needs gyn exam/ PAP. Patient knows of primary physician in [facility] area whom she would like to see and will call that office to set up. We are glad to send records from this office if requested. If PCP does not do pelvic exams, she should also establish with gyn.  As she is now out 12 years from the right breast cancer, likelihood of recurrence of this cancer is very low. Follow up now is particularly for second primary cancers and because of previous chemotherapy, tho could be done by PCP if she establishes with provider who can follow long term. As she does not have other providers now, I will schedule her back here in a year to be sure she is not lost to follow up, then may go to prn from that visit if appropriate. Patient understands and agrees.  Discussed genetics counseling based on premenopausal breast cancer, which she would like to do particularly as she has daughter. Referral placed in EPIC  Nevus low back has some concerning  features, tho patient tells me that it has been present since childhood. She agrees to referral to dermatology, has seen Dr Crista Luria or associate once during breast radiation due to skin reaction from radiation. Appointment requested now. . Since no longer on aromatase inhibitor, bone density scan appropriate in 2 years. WIll set this up at [facility] with mammograms in ~ 09-2015.  As breast tissue is primarily fatty, 3D/ tomo mammography may still give some better imaging tho not perhaps as markedly better as if breast tissue were dense.   Encouraged weight loss. She has managed this previously with Weight Watchers and exercise.   Assessment/Plan:  1.T1N0 ER/PR/HER 2 positive right breast cancer at age 61, pre- or peri-menopausal at diagnosis. Genetics referral made now. WIll have mammograms 09-2015. Follow up as above 2.weight gain related to work schedule, which has now improved. She is very motivated to address this 3.osteopenia by bone density scan 2010. Needs to resume regular weight bearing exercise  4.dark nevus center of low back: referral to dermatology 5.GERD: known to [facility] if does not improve with weight loss 6.recent death of her father from lung cancer   All questions answered and patient understands recommendations and plans. Time spent 25 min including >50% counseling and coordination of care. Tauri Ethington P, MD   02/01/2015, 3:01 PM

## 2015-02-02 ENCOUNTER — Telehealth: Payer: Self-pay | Admitting: Oncology

## 2015-02-02 ENCOUNTER — Ambulatory Visit: Payer: Managed Care, Other (non HMO)

## 2015-02-02 ENCOUNTER — Other Ambulatory Visit: Payer: Managed Care, Other (non HMO)

## 2015-02-02 DIAGNOSIS — M859 Disorder of bone density and structure, unspecified: Secondary | ICD-10-CM | POA: Insufficient documentation

## 2015-02-02 DIAGNOSIS — M8580 Other specified disorders of bone density and structure, unspecified site: Secondary | ICD-10-CM | POA: Insufficient documentation

## 2015-02-02 DIAGNOSIS — D225 Melanocytic nevi of trunk: Secondary | ICD-10-CM | POA: Insufficient documentation

## 2015-02-02 DIAGNOSIS — Z1231 Encounter for screening mammogram for malignant neoplasm of breast: Secondary | ICD-10-CM | POA: Insufficient documentation

## 2015-02-02 NOTE — Telephone Encounter (Signed)
Left message to confirm appointment for Dr. Tonia Brooms for 08/31 @ 3:30

## 2015-02-19 ENCOUNTER — Other Ambulatory Visit: Payer: Self-pay

## 2015-02-19 ENCOUNTER — Telehealth: Payer: Self-pay | Admitting: Oncology

## 2015-02-19 NOTE — Telephone Encounter (Signed)
Left message to confirm appointment for Bone Density and Mammo for March 2017

## 2015-03-08 ENCOUNTER — Ambulatory Visit (HOSPITAL_BASED_OUTPATIENT_CLINIC_OR_DEPARTMENT_OTHER): Payer: Managed Care, Other (non HMO) | Admitting: Genetic Counselor

## 2015-03-08 ENCOUNTER — Other Ambulatory Visit: Payer: Managed Care, Other (non HMO)

## 2015-03-08 ENCOUNTER — Encounter: Payer: Self-pay | Admitting: Genetic Counselor

## 2015-03-08 DIAGNOSIS — Z8052 Family history of malignant neoplasm of bladder: Secondary | ICD-10-CM | POA: Diagnosis not present

## 2015-03-08 DIAGNOSIS — Z801 Family history of malignant neoplasm of trachea, bronchus and lung: Secondary | ICD-10-CM

## 2015-03-08 DIAGNOSIS — Z315 Encounter for genetic counseling: Secondary | ICD-10-CM

## 2015-03-08 DIAGNOSIS — Z853 Personal history of malignant neoplasm of breast: Secondary | ICD-10-CM

## 2015-03-08 DIAGNOSIS — Z808 Family history of malignant neoplasm of other organs or systems: Secondary | ICD-10-CM

## 2015-03-08 NOTE — Progress Notes (Signed)
REFERRING PROVIDER: Gordy Levan, MD [address], [place] [address]  PRIMARY PROVIDER:  No PCP Per Patient  PRIMARY REASON FOR VISIT:  1. History of breast cancer in female   2. Family history of bladder cancer   3. Family history of lung cancer   4. Family history of skin cancer      HISTORY OF PRESENT ILLNESS:   [name], a 59 y.o. female, was seen for a [place] cancer genetics consultation at the request of Dr. Marko Plume due to a personal history of breast cancer diagnosed at 66 and family history of bladder and other cancers.  [name] presents to clinic today to discuss the possibility of a hereditary predisposition to cancer, genetic testing, and to further clarify her future cancer risks, as well as potential cancer risks for family members.   In 2004, at the age of 72, [name] was diagnosed with cancer of the right breast. Hormone receptor status was ER/PR+, HER2+.  This was treated with lumpectomy, adjuvant chemotherapy, local radiation, and tamoxifen then Femara.   CANCER HISTORY:   No history exists.  2004 - Cancer of right breast; ER/PR+, Her2+   HORMONAL RISK FACTORS:  Menarche was at age 19-13.  First live birth at age 53.  OCP use for approximately 10 years.  Ovaries intact: yes.  Hysterectomy: no.  Menopausal status: postmenopausal.  HRT use: 1 years. Colonoscopy: yes; normal. Mammogram within the last year: yes. Number of breast biopsies: 1. Up to date with pelvic exams:  Had a normal PAP in 2013, is changing doctors, but will have one this year Any excessive radiation exposure in the past:  no  Past Medical History  Diagnosis Date  . Cancer April 2004    right breast lumpectomy  . Breast cancer 2004    right breast; ER/PR+, Her2+    Past Surgical History  Procedure Laterality Date  . Breast surgery  10/2002    lumpectomy    Social History   Social History  . Marital Status: Legally Separated    Spouse Name: N/A  . Number  of Children: N/A  . Years of Education: N/A   Social History Main Topics  . Smoking status: Never Smoker   . Smokeless tobacco: Never Used  . Alcohol Use: 0.6 - 1.2 oz/week    1-2 Glasses of wine per week  . Drug Use: None  . Sexual Activity: Not Asked   Other Topics Concern  . None   Social History Narrative     FAMILY HISTORY:  We obtained a detailed, 4-generation family history.  Significant diagnoses are listed below: Family History  Problem Relation Age of Onset  . Heart disease Mother   . Diabetes Mother   . Kidney failure Mother   . Skin cancer Father     on ear; unknown type  . Lung cancer Father 76    smoker; metastasis to bone and liver  . Diabetes Brother   . Heart disease Brother   . Kidney failure Brother   . Diabetes Maternal Aunt   . Heart disease Maternal Aunt   . Heart Problems Maternal Uncle   . Diabetes Maternal Uncle   . Skin cancer Paternal Uncle     on nose; unknown type  . Heart attack Maternal Grandfather   . Heart attack Paternal Grandmother   . Heart attack Paternal Grandfather   . Bladder Cancer Brother     dx. late 34s; smoker  . Alzheimer's disease  Paternal Aunt   . Heart disease Paternal Aunt   . Alzheimer's disease Paternal Uncle     [name] has one daughter age, 69, and one son, age 99--both have never had cancer.  [name] has one full brother who passed away at 4, with diabetes and heart-related problems, and one maternal half brother who is currently 51.  This half brother has a history of bladder cancer, diagnosed in his late 88s; he is also a smoker.  [name] mother passed away from diabetes and heart-related problems at 62.  [name] father died of metastatic lung cancer at 86--he was also a smoker.  Her father also had a history of skin cancer on his ear.  [name] mother had one full sister who died in her mid-33s; she had diabetes and heart disease, but no cancer.  [name] mother also had six full brothers,  all of whom died in their 47s, many from heart and diabetes-related concerns.  [name] maternal grandmother passed away when [name]'s mother was still a child, but she is unsure of the cause of death.  Her maternal grandfather passed away from a heart attack in his mid- to late-70s.  [name] has several maternal cousins and is unaware of cancer diagnoses in any of these or other maternal relatives.  [name] father had three full sisters and four full brothers.  Only one sibling, a brother, has a history of cancer.  This brother is currently 55, is one of a set of twins and has a history of skin cancer on his nose.  The other twin has Alzheimer's but has not had cancer.  The remaining two paternal uncles died in their early 45s.  Two sisters passed away from heart disease in their late 60s-early 70s.  One sister is currently 79. Both [name] paternal grandmother and grandfather died of heart attacks at the age of 74.  [name] has limited information for many paternal cousins, but is unaware of any cancer diagnoses in these or other paternal relatives.  Patient's maternal and paternal ancestors are of English/Western European/Scandinavian descent as reported by an [place] DNA kit test. There is no reported Ashkenazi Jewish ancestry. There is no known consanguinity.  GENETIC COUNSELING ASSESSMENT: [name] is a 59 y.o. female with a personal history of breast cancer and family history of bladder and other cancers. We, therefore, discussed and recommended the following at today's visit.   DISCUSSION AND PLAN: We reviewed the characteristics and features of hereditary cancers.   We discussed that most cancers occur by chance, but that there are certain "red flags" within someone's personal and/or family history of cancer that can tip [name] off to hereditary cancer syndromes.  Some of these red flags include young age of cancer diagnosis (44 and younger for breast cancer; 62 and younger for  certain other types of cancer); multiple primary cancers in an individual; and multiple cases of related cancers in a family and in multiple generations of a family.  We discussed that Ms. Steuber's family does not exhibit these red flags.  She was diagnosed with breast cancer at a younger age, but still misses the criteria for genetic testing.  We discussed that bladder and lung cancers can be related to hereditary cancer syndromes, but that these very likely are explained by both her brother and her father's histories of smoking.  We also discussed the family history of skin cancer in her father and one paternal  uncle.  While melanoma skin cancers can be related to hereditary breast cancer syndromes, skin cancer is not uncommon and so very likely is not something to be too concerned with in Ms. Kock's family.  Her mother had only one sister and many brothers, and the maternal grandmother died at a younger age, but because Ms. Graciano's mother never had cancer at the time of her passing at 82 and Ms. Brick is unaware of cancer in any of her maternal cousins, this somewhat limited presence of female maternal relatives is probably not a concern for [name] either.    Thus, we discussed with Ms. Earwood that the family history is not highly consistent with a familial hereditary cancer syndrome, and we feel she is at low risk to harbor a gene mutation associated with such a condition.  Ms. Prothero reports feeling reassured following this discussion.  Her primary concern is still for her daughter.  We discussed that her daughter would be considered to be at a somewhat increased risk for breast cancer just based on her own history of breast cancer.  Her daughter should begin mammogram screening at 42, so she should make her primary physician aware of this history so that she may receive this future screening.  Ms. Weisbecker is worried about her daughter's smoking habits and we discussed that smoking can raise risks for cancer as can  many adverse lifestyle choices.  Ms. Sisler hopes that a further discussion with her daughter will spur her to consider cessation.    We discussed that there are genetic testing options available to Ms. Delucia in the event she is still concerned about her risks.  These options are relatively inexpensive and would be up-front, self-pay options, and so do not rely on insurance criteria to determine who is eligible.  If interested, we would be happy to facilitate this testing for Ms. Pisarski and a test kit could be shipped directly to her home address for saliva sample collection.  Ms. Luckenbaugh reported that she feels reassured by our conversation and is not interested in these testing options at this time.  She has our contact information should she change her mind. Thus, we did not recommend any genetic testing, at this time, and recommended Ms. Mcquade continue to follow the cancer screening guidelines given by her oncologist and primary healthcare providers.  Lastly, we encouraged Ms. Skirvin to remain in contact with cancer genetics annually so that we can continuously update the family history and inform her of any changes in cancer genetics and testing that may be of benefit for this family.   Ms.  Rotter questions were answered to her satisfaction today. Our contact information was provided should additional questions or concerns arise. Thank you for the referral and allowing [name] to share in the care of your patient.   Jeanine Luz, MS Genetic Counselor [email or web address] Phone: [phone]  The patient was seen for a total of 40 minutes in face-to-face genetic counseling.  This patient was discussed with Drs. Magrinat, Lindi Adie and/or Burr Medico who agrees with the above.    _______________________________________________________________________ For Office Staff:  Number of people involved in session: 1 Was an Intern/ student involved with case: no

## 2015-06-29 ENCOUNTER — Telehealth: Payer: Self-pay | Admitting: Oncology

## 2015-06-29 NOTE — Telephone Encounter (Signed)
Lvm advising appt 02/07/16 @ 2.30pm. Also mailed calendar.

## 2015-07-27 ENCOUNTER — Other Ambulatory Visit: Payer: Self-pay | Admitting: Physician Assistant

## 2015-07-27 ENCOUNTER — Other Ambulatory Visit (HOSPITAL_COMMUNITY)
Admission: RE | Admit: 2015-07-27 | Discharge: 2015-07-27 | Disposition: A | Discharge status: Home / Self Care | Payer: Managed Care, Other (non HMO) | Source: Ambulatory Visit | Attending: Physician Assistant | Admitting: Physician Assistant

## 2015-07-27 DIAGNOSIS — Z1151 Encounter for screening for human papillomavirus (HPV): Secondary | ICD-10-CM | POA: Insufficient documentation

## 2015-07-27 DIAGNOSIS — Z01419 Encounter for gynecological examination (general) (routine) without abnormal findings: Secondary | ICD-10-CM | POA: Diagnosis present

## 2015-08-01 LAB — CYTOLOGY - PAP

## 2015-10-17 ENCOUNTER — Ambulatory Visit
Admission: RE | Admit: 2015-10-17 | Discharge: 2015-10-17 | Disposition: A | Discharge status: Home / Self Care | Payer: Managed Care, Other (non HMO) | Source: Ambulatory Visit | Attending: Oncology | Admitting: Oncology

## 2015-10-17 DIAGNOSIS — E2839 Other primary ovarian failure: Secondary | ICD-10-CM

## 2015-10-17 DIAGNOSIS — Z1231 Encounter for screening mammogram for malignant neoplasm of breast: Secondary | ICD-10-CM

## 2015-10-22 ENCOUNTER — Telehealth: Payer: Self-pay

## 2015-10-22 NOTE — Telephone Encounter (Signed)
lvm per Dr LL attached message. 

## 2015-10-22 NOTE — Telephone Encounter (Signed)
-----   Message from Gordy Levan, MD sent at 10/19/2015  4:54 PM EDT ----- Labs seen and need follow up:  Next week please let her know that bone density is normal, stable in femur and better in spine than last bone density scan. Keep appointment as planned in July    thanks

## 2016-02-06 ENCOUNTER — Other Ambulatory Visit: Payer: Self-pay | Admitting: Oncology

## 2016-02-07 ENCOUNTER — Other Ambulatory Visit: Payer: Managed Care, Other (non HMO)

## 2016-02-07 ENCOUNTER — Ambulatory Visit: Payer: Managed Care, Other (non HMO) | Admitting: Oncology

## 2016-05-23 ENCOUNTER — Encounter: Payer: Self-pay | Admitting: Gastroenterology

## 2016-06-09 ENCOUNTER — Encounter: Payer: Self-pay | Admitting: Gastroenterology

## 2016-07-22 ENCOUNTER — Other Ambulatory Visit: Payer: Self-pay | Admitting: Family Medicine

## 2016-07-22 DIAGNOSIS — Z1231 Encounter for screening mammogram for malignant neoplasm of breast: Secondary | ICD-10-CM

## 2016-07-29 ENCOUNTER — Ambulatory Visit (AMBULATORY_SURGERY_CENTER): Payer: Self-pay

## 2016-07-29 VITALS — Ht 63.0 in | Wt 159.4 lb

## 2016-07-29 DIAGNOSIS — Z1211 Encounter for screening for malignant neoplasm of colon: Secondary | ICD-10-CM

## 2016-07-29 MED ORDER — NA SULFATE-K SULFATE-MG SULF 17.5-3.13-1.6 GM/177ML PO SOLN: ORAL | 0 refills | Status: DC

## 2016-07-29 NOTE — Progress Notes (Signed)
Per pt, no allergies to soy or egg products.Pt not taking any weight loss meds or using  O2 at home. 

## 2016-08-15 ENCOUNTER — Ambulatory Visit (AMBULATORY_SURGERY_CENTER): Payer: Managed Care, Other (non HMO) | Admitting: Gastroenterology

## 2016-08-15 ENCOUNTER — Encounter: Payer: Self-pay | Admitting: Gastroenterology

## 2016-08-15 VITALS — BP 161/85 | HR 76 | Temp 97.5°F | Resp 17 | Ht 63.0 in | Wt 159.0 lb

## 2016-08-15 DIAGNOSIS — Z1211 Encounter for screening for malignant neoplasm of colon: Secondary | ICD-10-CM

## 2016-08-15 DIAGNOSIS — Z1212 Encounter for screening for malignant neoplasm of rectum: Secondary | ICD-10-CM | POA: Diagnosis not present

## 2016-08-15 MED ORDER — SODIUM CHLORIDE 0.9 % IV SOLN: 500.0000 mL | INTRAVENOUS | Status: DC

## 2016-08-15 NOTE — Progress Notes (Signed)
A/ox3 pleased with MAC, report to [employer]

## 2016-08-15 NOTE — Op Note (Signed)
[facility] Patient Name: [name] Procedure Date: 08/15/2016 8:50 AM MRN: [id number] Endoscopist: Mauri Pole , MD Age: 61 Referring MD:  Date of Birth: 10-08-1955 Gender: Female Account #: [id number] Procedure:                Colonoscopy Indications:              Screening for colorectal malignant neoplasm, Last                            colonoscopy: 2007 Medicines:                Monitored Anesthesia Care Procedure:                Pre-Anesthesia Assessment:                           - Prior to the procedure, a History and Physical                            was performed, and patient medications and                            allergies were reviewed. The patient's tolerance of                            previous anesthesia was also reviewed. The risks                            and benefits of the procedure and the sedation                            options and risks were discussed with the patient.                            All questions were answered, and informed consent                            was obtained. Prior Anticoagulants: The patient has                            taken no previous anticoagulant or antiplatelet                            agents. ASA Grade Assessment: II - A patient with                            mild systemic disease. After reviewing the risks                            and benefits, the patient was deemed in                            satisfactory condition to undergo the procedure.  After obtaining informed consent, the colonoscope                            was passed under direct vision. Throughout the                            procedure, the patient's blood pressure, pulse, and                            oxygen saturations were monitored continuously. The                            Model CF-HQ190L [id number]) scope was introduced                            through the anus and advanced to the  the terminal                            ileum, with identification of the appendiceal                            orifice and IC valve. The colonoscopy was performed                            without difficulty. The patient tolerated the                            procedure well. The quality of the bowel                            preparation was excellent. The terminal ileum,                            ileocecal valve, appendiceal orifice, and rectum                            were photographed. Scope In: 8:54:26 AM Scope Out: 9:09:23 AM Scope Withdrawal Time: 0 hours 10 minutes 6 seconds  Total Procedure Duration: 0 hours 14 minutes 57 seconds  Findings:                 The perianal and digital rectal examinations were                            normal.                           Non-bleeding internal hemorrhoids were found during                            retroflexion. The hemorrhoids were small.                           The exam was otherwise without abnormality. Complications:            No immediate complications. Estimated Blood  Loss:     Estimated blood loss: none. Impression:               - Non-bleeding internal hemorrhoids.                           - The examination was otherwise normal.                           - No specimens collected. Recommendation:           - Patient has a contact number available for                            emergencies. The signs and symptoms of potential                            delayed complications were discussed with the                            patient. Return to normal activities tomorrow.                            Written discharge instructions were provided to the                            patient.                           - Resume previous diet.                           - Continue present medications.                           - Repeat colonoscopy in 10 years for screening                            purposes.                            - Return to GI clinic PRN. Mauri Pole, MD 08/15/2016 9:13:44 AM This report has been signed electronically.

## 2016-08-15 NOTE — Patient Instructions (Signed)
Impression/Recommendations:  Hemorrhoid handout given to patient.  Repeat colonoscopy in 10 years for screening purposes.  YOU HAD AN ENDOSCOPIC PROCEDURE TODAY AT THE [facility]:   Refer to the procedure report that was given to you for any specific questions about what was found during the examination.  If the procedure report does not answer your questions, please call your gastroenterologist to clarify.  If you requested that your care partner not be given the details of your procedure findings, then the procedure report has been included in a sealed envelope for you to review at your convenience later.  YOU SHOULD EXPECT: Some feelings of bloating in the abdomen. Passage of more gas than usual.  Walking can help get rid of the air that was put into your GI tract during the procedure and reduce the bloating. If you had a lower endoscopy (such as a colonoscopy or flexible sigmoidoscopy) you may notice spotting of blood in your stool or on the toilet paper. If you underwent a bowel prep for your procedure, you may not have a normal bowel movement for a few days.  Please Note:  You might notice some irritation and congestion in your nose or some drainage.  This is from the oxygen used during your procedure.  There is no need for concern and it should clear up in a day or so.  SYMPTOMS TO REPORT IMMEDIATELY:   Following lower endoscopy (colonoscopy or flexible sigmoidoscopy):  Excessive amounts of blood in the stool  Significant tenderness or worsening of abdominal pains  Swelling of the abdomen that is new, acute  Fever of 100F or higher  For urgent or emergent issues, a gastroenterologist can be reached at any hour by calling [phone].   DIET:  We do recommend a small meal at first, but then you may proceed to your regular diet.  Drink plenty of fluids but you should avoid alcoholic beverages for 24 hours.  ACTIVITY:  You should plan to take it easy for the rest of  today and you should NOT DRIVE or use heavy machinery until tomorrow (because of the sedation medicines used during the test).    FOLLOW UP: Our staff will call the number listed on your records the next business day following your procedure to check on you and address any questions or concerns that you may have regarding the information given to you following your procedure. If we do not reach you, we will leave a message.  However, if you are feeling well and you are not experiencing any problems, there is no need to return our call.  We will assume that you have returned to your regular daily activities without incident.  If any biopsies were taken you will be contacted by phone or by letter within the next 1-3 weeks.  Please call us at [phone] if you have not heard about the biopsies in 3 weeks.    SIGNATURES/CONFIDENTIALITY: You and/or your care partner have signed paperwork which will be entered into your electronic medical record.  These signatures attest to the fact that that the information above on your After Visit Summary has been reviewed and is understood.  Full responsibility of the confidentiality of this discharge information lies with you and/or your care-partner. 

## 2016-08-18 ENCOUNTER — Telehealth: Payer: Self-pay

## 2016-08-18 NOTE — Telephone Encounter (Signed)
  Follow up Call-  Call back number 08/15/2016  Post procedure Call Back phone  # [phone]  Permission to leave phone message Yes  Some recent data might be hidden     Patient questions:  Do you have a fever, pain , or abdominal swelling? No. Pain Score  0 *  Have you tolerated food without any problems? Yes.    Have you been able to return to your normal activities? Yes.    Do you have any questions about your discharge instructions: Diet   No. Medications  No. Follow up visit  No.  Do you have questions or concerns about your Care? No.  Actions: * If pain score is 4 or above: No action needed, pain <4.

## 2016-08-21 ENCOUNTER — Telehealth: Payer: Self-pay | Admitting: Oncology

## 2016-08-21 NOTE — Telephone Encounter (Signed)
Faxed records to [facility] [phone]

## 2016-08-22 ENCOUNTER — Other Ambulatory Visit: Payer: Self-pay | Admitting: Family Medicine

## 2016-10-20 ENCOUNTER — Ambulatory Visit: Payer: Managed Care, Other (non HMO)

## 2016-10-23 ENCOUNTER — Ambulatory Visit
Admission: RE | Admit: 2016-10-23 | Discharge: 2016-10-23 | Disposition: A | Discharge status: Home / Self Care | Payer: Managed Care, Other (non HMO) | Source: Ambulatory Visit | Attending: Family Medicine | Admitting: Family Medicine

## 2016-10-23 ENCOUNTER — Other Ambulatory Visit: Payer: Self-pay | Admitting: Family Medicine

## 2016-10-23 DIAGNOSIS — Z1231 Encounter for screening mammogram for malignant neoplasm of breast: Secondary | ICD-10-CM

## 2016-10-23 HISTORY — DX: Personal history of antineoplastic chemotherapy: Z92.21

## 2016-10-23 HISTORY — DX: Personal history of irradiation: Z92.3

## 2017-07-10 ENCOUNTER — Other Ambulatory Visit: Payer: Self-pay | Admitting: Family Medicine

## 2017-07-10 DIAGNOSIS — Z139 Encounter for screening, unspecified: Secondary | ICD-10-CM

## 2017-07-21 HISTORY — PX: BREAST BIOPSY: SHX20

## 2017-09-22 ENCOUNTER — Other Ambulatory Visit: Payer: Self-pay | Admitting: Family Medicine

## 2017-09-22 DIAGNOSIS — E2839 Other primary ovarian failure: Secondary | ICD-10-CM

## 2017-10-23 ENCOUNTER — Ambulatory Visit
Admission: RE | Admit: 2017-10-23 | Discharge: 2017-10-23 | Disposition: A | Discharge status: Home / Self Care | Payer: Managed Care, Other (non HMO) | Source: Ambulatory Visit | Attending: Family Medicine | Admitting: Family Medicine

## 2017-10-23 DIAGNOSIS — E2839 Other primary ovarian failure: Secondary | ICD-10-CM

## 2017-10-26 ENCOUNTER — Ambulatory Visit
Admission: RE | Admit: 2017-10-26 | Discharge: 2017-10-26 | Disposition: A | Discharge status: Home / Self Care | Payer: Managed Care, Other (non HMO) | Source: Ambulatory Visit | Attending: Family Medicine | Admitting: Family Medicine

## 2017-10-26 DIAGNOSIS — Z139 Encounter for screening, unspecified: Secondary | ICD-10-CM

## 2017-10-28 ENCOUNTER — Other Ambulatory Visit: Payer: Self-pay | Admitting: Family Medicine

## 2017-10-28 DIAGNOSIS — R928 Other abnormal and inconclusive findings on diagnostic imaging of breast: Secondary | ICD-10-CM

## 2017-11-02 ENCOUNTER — Ambulatory Visit
Admission: RE | Admit: 2017-11-02 | Discharge: 2017-11-02 | Disposition: A | Discharge status: Home / Self Care | Payer: Managed Care, Other (non HMO) | Source: Ambulatory Visit | Attending: Family Medicine | Admitting: Family Medicine

## 2017-11-02 ENCOUNTER — Other Ambulatory Visit: Payer: Self-pay | Admitting: Family Medicine

## 2017-11-02 DIAGNOSIS — N6489 Other specified disorders of breast: Secondary | ICD-10-CM

## 2017-11-02 DIAGNOSIS — R928 Other abnormal and inconclusive findings on diagnostic imaging of breast: Secondary | ICD-10-CM

## 2017-11-05 ENCOUNTER — Other Ambulatory Visit: Payer: Self-pay | Admitting: Family Medicine

## 2017-11-05 ENCOUNTER — Ambulatory Visit
Admission: RE | Admit: 2017-11-05 | Discharge: 2017-11-05 | Disposition: A | Discharge status: Home / Self Care | Payer: Managed Care, Other (non HMO) | Source: Ambulatory Visit | Attending: Family Medicine | Admitting: Family Medicine

## 2017-11-05 DIAGNOSIS — N6489 Other specified disorders of breast: Secondary | ICD-10-CM

## 2018-07-22 ENCOUNTER — Other Ambulatory Visit: Payer: Self-pay | Admitting: Family Medicine

## 2018-07-22 DIAGNOSIS — Z1231 Encounter for screening mammogram for malignant neoplasm of breast: Secondary | ICD-10-CM

## 2018-09-17 ENCOUNTER — Ambulatory Visit
Admission: RE | Admit: 2018-09-17 | Discharge: 2018-09-17 | Disposition: A | Discharge status: Home / Self Care | Payer: Managed Care, Other (non HMO) | Source: Ambulatory Visit | Attending: Family Medicine | Admitting: Family Medicine

## 2018-09-17 ENCOUNTER — Other Ambulatory Visit: Payer: Self-pay | Admitting: Family Medicine

## 2018-09-17 DIAGNOSIS — M545 Low back pain, unspecified: Secondary | ICD-10-CM

## 2018-11-01 ENCOUNTER — Ambulatory Visit: Payer: Managed Care, Other (non HMO)

## 2018-12-20 ENCOUNTER — Encounter

## 2018-12-20 ENCOUNTER — Ambulatory Visit: Payer: Managed Care, Other (non HMO)

## 2019-01-08 ENCOUNTER — Ambulatory Visit: Payer: Managed Care, Other (non HMO)

## 2019-02-01 ENCOUNTER — Ambulatory Visit
Admission: RE | Admit: 2019-02-01 | Discharge: 2019-02-01 | Disposition: A | Discharge status: Home / Self Care | Payer: Managed Care, Other (non HMO) | Source: Ambulatory Visit | Attending: Family Medicine | Admitting: Family Medicine

## 2019-02-01 ENCOUNTER — Other Ambulatory Visit: Payer: Self-pay

## 2019-02-01 DIAGNOSIS — Z1231 Encounter for screening mammogram for malignant neoplasm of breast: Secondary | ICD-10-CM

## 2019-07-26 ENCOUNTER — Other Ambulatory Visit: Payer: Self-pay | Admitting: Family Medicine

## 2019-07-26 DIAGNOSIS — Z1231 Encounter for screening mammogram for malignant neoplasm of breast: Secondary | ICD-10-CM

## 2019-09-26 ENCOUNTER — Other Ambulatory Visit: Payer: Self-pay | Admitting: Family Medicine

## 2019-09-26 DIAGNOSIS — M8588 Other specified disorders of bone density and structure, other site: Secondary | ICD-10-CM

## 2019-10-23 NOTE — Progress Notes (Signed)
Cardiology Office Note:    Date:  10/24/2019   ID:  [name], DOB [birth year], MRN [id number]  PCP:  Caren Macadam, MD  Cardiologist:  No primary care provider on file.  Electrophysiologist:  None   Referring MD: Caren Macadam, MD   Chief Complaint  Patient presents with  . Heart Murmur    History of Present Illness:    [name] is a 64 y.o. female with a hx of breast cancer, hyperlipidemia who is referred by Dr. Herbert Deaner for evaluation of heart murmur.  She reports that she followed at [facility] as a child, was told she had an issue with her aortic valve.  She had a heart catheterization done in middle school.  States that she followed with cardiology there until college, has not been seen for 40 years.  Reports that she was told that everything resolved and she did not need further cardiology follow-up.  She denies any chest pain or dyspnea.  States that she walks about twice per week, usually from 45 minutes to an hour.  Prior to pandemic was walking for 1 to 2 hours.  She denies any lightheadedness, syncope, or palpitations.  Does report occasional lower extremity edema.  No history of smoking.  Family history includes mother died of MI at age 5, and brother had CABG/AVR and died at age 56.  TTE 02-10-2007 showed LVEF 55%, mild LV dilatation, mild MR, mild AI.   Past Medical History:  Diagnosis Date  . Breast cancer ([place]) 2004   right breast; ER/PR+, Her2+  . Cancer [facility]) April 2004   right breast lumpectomy  . GERD (gastroesophageal reflux disease)   . Hyperlipidemia   . Personal history of chemotherapy 11/19/2002  . Personal history of radiation therapy 02/19/2003    Past Surgical History:  Procedure Laterality Date  . BREAST BIOPSY Right 2019  . BREAST LUMPECTOMY Right   . BREAST SURGERY  10/2002   lumpectomy / right side    Current Medications: Current Meds  Medication Sig  . ergocalciferol (VITAMIN D2) 1.25 MG (50000 UT) capsule Take 50,000 Units by  mouth once a week.   Current Facility-Administered Medications for the 10/24/19 encounter (Office Visit) with Donato Heinz, MD  Medication  . 0.9 %  sodium chloride infusion     Allergies:   Patient has no known allergies.   Social History   Socioeconomic History  . Marital status: Divorced    Spouse name: Not on file  . Number of children: Not on file  . Years of education: Not on file  . Highest education level: Not on file  Occupational History  . Not on file  Tobacco Use  . Smoking status: Never Smoker  . Smokeless tobacco: Never Used  Substance and Sexual Activity  . Alcohol use: Yes    Alcohol/week: 1.0 - 2.0 standard drinks    Types: 1 - 2 Glasses of wine per week    Comment: occasional  . Drug use: No  . Sexual activity: Not on file  Other Topics Concern  . Not on file  Social History Narrative  . Not on file   Social Determinants of Health   Financial Resource Strain:   . Difficulty of Paying Living Expenses:   Food Insecurity:   . Worried About Charity fundraiser in the Last Year:   . Arboriculturist in the Last Year:   Transportation Needs:   . Film/video editor (Medical):   Marland Kitchen  Lack of Transportation (Non-Medical):   Physical Activity:   . Days of Exercise per Week:   . Minutes of Exercise per Session:   Stress:   . Feeling of Stress :   Social Connections:   . Frequency of Communication with Friends and Family:   . Frequency of Social Gatherings with Friends and Family:   . Attends Religious Services:   . Active Member of Clubs or Organizations:   . Attends Archivist Meetings:   Marland Kitchen Marital Status:      Family History: The patient's family history includes Alzheimer's disease in her paternal aunt and paternal uncle; Bladder Cancer in her brother; Diabetes in her brother, maternal aunt, maternal uncle, and mother; Heart Problems in her maternal uncle; Heart attack in her maternal grandfather, paternal grandfather, and  paternal grandmother; Heart disease in her brother, maternal aunt, mother, and paternal aunt; Kidney failure in her brother and mother; Lung cancer (age of onset: 34) in her father; Skin cancer in her father and paternal uncle. There is no history of Breast cancer.  ROS:   Please see the history of present illness.     All other systems reviewed and are negative.  EKGs/Labs/Other Studies Reviewed:    The following studies were reviewed today:   EKG:  EKG is  ordered today.  The ekg ordered today demonstrates normal sinus rhythm, rate 73, Q waves in V1/2, no ST/T abnormalities  Recent Labs: No results found for requested labs within last 8760 hours.  Recent Lipid Panel No results found for: CHOL, TRIG, HDL, CHOLHDL, VLDL, LDLCALC, LDLDIRECT  Physical Exam:    VS:  BP 110/87   Pulse 73   Temp (!) 97.3 F (36.3 C)   Ht _0  (1.6 m)   Wt 153 lb 3.2 oz (69.5 kg)   SpO2 99%   BMI 27.14 kg/m     Wt Readings from Last 3 Encounters:  10/24/19 153 lb 3.2 oz (69.5 kg)  08/15/16 159 lb (72.1 kg)  07/29/16 159 lb 6.4 oz (72.3 kg)     GEN:  Well nourished, well developed in no acute distress HEENT: Normal NECK: No JVD; No carotid bruits LYMPHATICS: No lymphadenopathy CARDIAC: RRR, 2/6 systolic heart murmur RESPIRATORY:  Clear to auscultation without rales, wheezing or rhonchi  ABDOMEN: Soft, non-tender, non-distended MUSCULOSKELETAL:  No edema; No deformity  SKIN: Warm and dry NEUROLOGIC:  Alert and oriented x 3 PSYCHIATRIC:  Normal affect   ASSESSMENT:    1. Heart murmur   2. Hyperlipidemia, unspecified hyperlipidemia type    PLAN:    Heart murmur: 2/6 systolic heart murmur.  Mild AI/mild MR on TTE in 2008.  Will check TTE.  Hyperlipidemia: LDL 161 on 09/22/19.  10 year ASCVD risk score 4%.  Does not meet indication for statin.  Diet and exercise recommended.  RTC in 1 month  Medication Adjustments/Labs and Tests Ordered: Current medicines are reviewed at length with  the patient today.  Concerns regarding medicines are outlined above.  Orders Placed This Encounter  Procedures  . EKG 12-Lead  . ECHOCARDIOGRAM COMPLETE   No orders of the defined types were placed in this encounter.   Patient Instructions  Medication Instructions:  Your physician recommends that you continue on your current medications as directed. Please refer to the Current Medication list given to you today.  Lab Work: NONE  Testing/Procedures: Your physician has requested that you have an echocardiogram. Echocardiography is a painless test that uses sound waves to create  images of your heart. It provides your doctor with information about the size and shape of your heart and how well your heart's chambers and valves are working. This procedure takes approximately one hour. There are no restrictions for this procedure.  This will be done at our [facility] location:  [place]: At [facility], you and your health needs are our priority.  As part of our continuing mission to provide you with exceptional heart care, we have created designated Provider Care Teams.  These Care Teams include your primary Cardiologist (physician) and Advanced Practice Providers (APPs -  Physician Assistants and Nurse Practitioners) who all work together to provide you with the care you need, when you need it.  We recommend signing up for the patient portal called "MyChart".  Sign up information is provided on this After Visit Summary.  MyChart is used to connect with patients for Virtual Visits (Telemedicine).  Patients are able to view lab/test results, encounter notes, upcoming appointments, etc.  Non-urgent messages can be sent to your provider as well.   To learn more about what you can do with MyChart, go to [email or web address].    Your next appointment:   1 month(s)  The format for your next appointment:   In Person  Provider:   Oswaldo Milian,  MD         Signed, Donato Heinz, MD  10/24/2019 5:23 PM    [place]

## 2019-10-24 ENCOUNTER — Ambulatory Visit (INDEPENDENT_AMBULATORY_CARE_PROVIDER_SITE_OTHER): Payer: Managed Care, Other (non HMO) | Admitting: Cardiology

## 2019-10-24 ENCOUNTER — Encounter: Payer: Self-pay | Admitting: Cardiology

## 2019-10-24 ENCOUNTER — Other Ambulatory Visit: Payer: Self-pay

## 2019-10-24 VITALS — BP 110/87 | HR 73 | Temp 97.3°F | Ht 63.0 in | Wt 153.2 lb

## 2019-10-24 DIAGNOSIS — E785 Hyperlipidemia, unspecified: Secondary | ICD-10-CM

## 2019-10-24 DIAGNOSIS — R011 Cardiac murmur, unspecified: Secondary | ICD-10-CM | POA: Diagnosis not present

## 2019-10-24 NOTE — Patient Instructions (Signed)
Medication Instructions:  Your physician recommends that you continue on your current medications as directed. Please refer to the Current Medication list given to you today.  Lab Work: NONE  Testing/Procedures: Your physician has requested that you have an echocardiogram. Echocardiography is a painless test that uses sound waves to create images of your heart. It provides your doctor with information about the size and shape of your heart and how well your heart's chambers and valves are working. This procedure takes approximately one hour. There are no restrictions for this procedure.  This will be done at our [facility] location:  [place]: At [facility], you and your health needs are our priority.  As part of our continuing mission to provide you with exceptional heart care, we have created designated Provider Care Teams.  These Care Teams include your primary Cardiologist (physician) and Advanced Practice Providers (APPs -  Physician Assistants and Nurse Practitioners) who all work together to provide you with the care you need, when you need it.  We recommend signing up for the patient portal called "MyChart".  Sign up information is provided on this After Visit Summary.  MyChart is used to connect with patients for Virtual Visits (Telemedicine).  Patients are able to view lab/test results, encounter notes, upcoming appointments, etc.  Non-urgent messages can be sent to your provider as well.   To learn more about what you can do with MyChart, go to [email or web address].    Your next appointment:   1 month(s)  The format for your next appointment:   In Person  Provider:   Oswaldo Milian, MD

## 2019-11-14 ENCOUNTER — Ambulatory Visit (HOSPITAL_COMMUNITY): Payer: Managed Care, Other (non HMO) | Attending: Internal Medicine

## 2019-11-14 ENCOUNTER — Other Ambulatory Visit: Payer: Self-pay

## 2019-11-14 DIAGNOSIS — R011 Cardiac murmur, unspecified: Secondary | ICD-10-CM | POA: Diagnosis not present

## 2019-11-27 NOTE — Progress Notes (Signed)
Cardiology Office Note:    Date:  11/29/2019   ID:  [name], DOB [birth year], MRN [id number]  PCP:  Caren Macadam, MD  Cardiologist:  No primary care provider on file.  Electrophysiologist:  None   Referring MD: Caren Macadam, MD   Chief Complaint  Patient presents with  . Cardiac Valve Problem    History of Present Illness:    [name] is a 64 y.o. female with a hx of breast cancer, hyperlipidemia who presents for follow-up.  She was referred by Dr. Mannie Stabile for evaluation of heart murmur, initially seen on 10/24/2019.  She reports that she followed at [facility] as a child, was told she had an issue with her aortic valve.  She had a heart catheterization done in middle school.  States that she followed with cardiology there until college, has not been seen for 40 years.  Reports that she was told that everything resolved and she did not need further cardiology follow-up.  She denies any chest pain or dyspnea.  States that she walks about twice per week, usually from 45 minutes to an hour.  Prior to pandemic was walking for 1 to 2 hours.  She denies any lightheadedness, syncope, or palpitations.  Does report occasional lower extremity edema.  No history of smoking.  Family history includes mother died of MI at age 3, and brother had CABG/AVR and died at age 67.  TTE 02-11-2007 showed LVEF 55%, mild LV dilatation, mild MR, mild AI.  TTE on 11/14/2019 showed LVEF 65 to 70%, indeterminate diastolic filling, normal RV systolic function, mild aortic stenosis, mild aortic regurgitation, mild mitral regurgitation.  Since last clinic visit, she reports that she is doing well.  She is walking twice per week for 30 to 60 minutes.  She denies any exertional chest pain or dyspnea.  Denies any syncope, lower extremity edema, or palpitations.  Does report intermittent lightheadedness.  Past Medical History:  Diagnosis Date  . Breast cancer ([place]) 2004   right breast; ER/PR+, Her2+  . Cancer  [facility]) April 2004   right breast lumpectomy  . GERD (gastroesophageal reflux disease)   . Hyperlipidemia   . Personal history of chemotherapy 11/19/2002  . Personal history of radiation therapy 02/19/2003    Past Surgical History:  Procedure Laterality Date  . BREAST BIOPSY Right 2019  . BREAST LUMPECTOMY Right   . BREAST SURGERY  10/2002   lumpectomy / right side    Current Medications: Current Meds  Medication Sig  . ergocalciferol (VITAMIN D2) 1.25 MG (50000 UT) capsule Take 50,000 Units by mouth once a week.   Current Facility-Administered Medications for the 11/29/19 encounter (Office Visit) with Donato Heinz, MD  Medication  . 0.9 %  sodium chloride infusion     Allergies:   Patient has no known allergies.   Social History   Socioeconomic History  . Marital status: Divorced    Spouse name: Not on file  . Number of children: Not on file  . Years of education: Not on file  . Highest education level: Not on file  Occupational History  . Not on file  Tobacco Use  . Smoking status: Never Smoker  . Smokeless tobacco: Never Used  Substance and Sexual Activity  . Alcohol use: Yes    Alcohol/week: 1.0 - 2.0 standard drinks    Types: 1 - 2 Glasses of wine per week    Comment: occasional  . Drug use: No  . Sexual activity:  Not on file  Other Topics Concern  . Not on file  Social History Narrative  . Not on file   Social Determinants of Health   Financial Resource Strain:   . Difficulty of Paying Living Expenses:   Food Insecurity:   . Worried About Charity fundraiser in the Last Year:   . Arboriculturist in the Last Year:   Transportation Needs:   . Film/video editor (Medical):   Marland Kitchen Lack of Transportation (Non-Medical):   Physical Activity:   . Days of Exercise per Week:   . Minutes of Exercise per Session:   Stress:   . Feeling of Stress :   Social Connections:   . Frequency of Communication with Friends and Family:   . Frequency of  Social Gatherings with Friends and Family:   . Attends Religious Services:   . Active Member of Clubs or Organizations:   . Attends Archivist Meetings:   Marland Kitchen Marital Status:      Family History: The patient's family history includes Alzheimer's disease in her paternal aunt and paternal uncle; Bladder Cancer in her brother; Diabetes in her brother, maternal aunt, maternal uncle, and mother; Heart Problems in her maternal uncle; Heart attack in her maternal grandfather, paternal grandfather, and paternal grandmother; Heart disease in her brother, maternal aunt, mother, and paternal aunt; Kidney failure in her brother and mother; Lung cancer (age of onset: 8) in her father; Skin cancer in her father and paternal uncle. There is no history of Breast cancer.  ROS:   Please see the history of present illness.     All other systems reviewed and are negative.  EKGs/Labs/Other Studies Reviewed:    The following studies were reviewed today:   EKG:  EKG is not ordered today.  The ekg ordered most recently demonstrates normal sinus rhythm, rate 73, Q waves in V1/2, no ST/T abnormalities  TTE 11/14/19: 1. Left ventricular ejection fraction, by estimation, is 65 to 70%. The  left ventricle has normal function. The left ventricle has no regional  wall motion abnormalities. Indeterminate diastolic filling due to E-A  fusion.  2. Right ventricular systolic function is normal. The right ventricular  size is normal. There is normal pulmonary artery systolic pressure.  3. The mitral valve is normal in structure. Mild mitral valve  regurgitation.  4. AV is thickened, calcified Peak and mean gradients through the valve  are 29 and 17 mm Hg respectively consistent with mild aortic stenosis..  The aortic valve is tricuspid. Aortic valve regurgitation is mild. Mild  aortic valve stenosis.  5. The inferior vena cava is normal in size with greater than 50%  respiratory variability, suggesting  right atrial pressure of 3 mmHg.   Recent Labs: No results found for requested labs within last 8760 hours.  Recent Lipid Panel No results found for: CHOL, TRIG, HDL, CHOLHDL, VLDL, LDLCALC, LDLDIRECT  Physical Exam:    VS:  BP 134/88   Pulse 100   Ht 5' 3.5" (1.613 m)   Wt 160 lb 6.4 oz (72.8 kg)   BMI 27.97 kg/m     Wt Readings from Last 3 Encounters:  11/29/19 160 lb 6.4 oz (72.8 kg)  10/24/19 153 lb 3.2 oz (69.5 kg)  08/15/16 159 lb (72.1 kg)     GEN:  Well nourished, well developed in no acute distress HEENT: Normal NECK: No JVD CARDIAC: RRR, 2/6 systolic heart murmur RESPIRATORY:  Clear to auscultation without rales, wheezing or  rhonchi  ABDOMEN: Soft, non-tender, non-distended MUSCULOSKELETAL:  No edema; No deformity  SKIN: Warm and dry NEUROLOGIC:  Alert and oriented x 3 PSYCHIATRIC:  Normal affect   ASSESSMENT:    1. Aortic stenosis, mild   2. Hyperlipidemia, unspecified hyperlipidemia type   3. Mild aortic regurgitation   4. Mitral valve insufficiency, unspecified etiology    PLAN:    Valvular disease: Mild aortic regurgitation/stenosis and mild mitral regurgitation on TTE in 11/14/2019.  Plan to repeat echo for monitoring in 2 years, or sooner if change in symptoms  Hyperlipidemia: LDL 161 on 09/22/19.  10 year ASCVD risk score 4%.  Does not meet indication for statin.  Diet and exercise recommended.  RTC in 1 year  Medication Adjustments/Labs and Tests Ordered: Current medicines are reviewed at length with the patient today.  Concerns regarding medicines are outlined above.  No orders of the defined types were placed in this encounter.  No orders of the defined types were placed in this encounter.   Patient Instructions  Medication Instructions:  Your physician recommends that you continue on your current medications as directed. Please refer to the Current Medication list given to you today.  *If you need a refill on your cardiac medications before  your next appointment, please call your pharmacy*  Follow-Up: At [facility], you and your health needs are our priority.  As part of our continuing mission to provide you with exceptional heart care, we have created designated Provider Care Teams.  These Care Teams include your primary Cardiologist (physician) and Advanced Practice Providers (APPs -  Physician Assistants and Nurse Practitioners) who all work together to provide you with the care you need, when you need it.  We recommend signing up for the patient portal called "MyChart".  Sign up information is provided on this After Visit Summary.  MyChart is used to connect with patients for Virtual Visits (Telemedicine).  Patients are able to view lab/test results, encounter notes, upcoming appointments, etc.  Non-urgent messages can be sent to your provider as well.   To learn more about what you can do with MyChart, go to [email or web address].    Your next appointment:   12 month(s)  The format for your next appointment:   In Person  Provider:   Oswaldo Milian, MD       Signed, Donato Heinz, MD  11/29/2019 5:59 PM    [place]

## 2019-11-29 ENCOUNTER — Other Ambulatory Visit: Payer: Self-pay

## 2019-11-29 ENCOUNTER — Ambulatory Visit (INDEPENDENT_AMBULATORY_CARE_PROVIDER_SITE_OTHER): Payer: Managed Care, Other (non HMO) | Admitting: Cardiology

## 2019-11-29 ENCOUNTER — Encounter: Payer: Self-pay | Admitting: Cardiology

## 2019-11-29 VITALS — BP 134/88 | HR 100 | Ht 63.5 in | Wt 160.4 lb

## 2019-11-29 DIAGNOSIS — E785 Hyperlipidemia, unspecified: Secondary | ICD-10-CM

## 2019-11-29 DIAGNOSIS — I350 Nonrheumatic aortic (valve) stenosis: Secondary | ICD-10-CM

## 2019-11-29 DIAGNOSIS — I340 Nonrheumatic mitral (valve) insufficiency: Secondary | ICD-10-CM

## 2019-11-29 DIAGNOSIS — I351 Nonrheumatic aortic (valve) insufficiency: Secondary | ICD-10-CM | POA: Diagnosis not present

## 2019-11-29 NOTE — Patient Instructions (Signed)

## 2020-02-03 ENCOUNTER — Other Ambulatory Visit: Payer: Managed Care, Other (non HMO)

## 2020-02-03 ENCOUNTER — Ambulatory Visit: Payer: Managed Care, Other (non HMO)

## 2020-04-06 ENCOUNTER — Other Ambulatory Visit: Payer: Self-pay

## 2020-04-06 ENCOUNTER — Ambulatory Visit
Admission: RE | Admit: 2020-04-06 | Discharge: 2020-04-06 | Disposition: A | Discharge status: Home / Self Care | Payer: Managed Care, Other (non HMO) | Source: Ambulatory Visit | Attending: Family Medicine | Admitting: Family Medicine

## 2020-04-06 DIAGNOSIS — Z1231 Encounter for screening mammogram for malignant neoplasm of breast: Secondary | ICD-10-CM

## 2020-04-06 DIAGNOSIS — M8588 Other specified disorders of bone density and structure, other site: Secondary | ICD-10-CM

## 2020-09-27 DIAGNOSIS — H524 Presbyopia: Secondary | ICD-10-CM | POA: Diagnosis not present

## 2020-10-26 ENCOUNTER — Other Ambulatory Visit: Payer: Self-pay | Admitting: Family Medicine

## 2020-10-26 DIAGNOSIS — Z1231 Encounter for screening mammogram for malignant neoplasm of breast: Secondary | ICD-10-CM

## 2020-11-27 ENCOUNTER — Other Ambulatory Visit (HOSPITAL_COMMUNITY)
Admission: RE | Admit: 2020-11-27 | Discharge: 2020-11-27 | Disposition: A | Discharge status: Home / Self Care | Payer: Medicare Other | Source: Ambulatory Visit | Attending: Family Medicine | Admitting: Family Medicine

## 2020-11-27 ENCOUNTER — Other Ambulatory Visit: Payer: Self-pay | Admitting: Family Medicine

## 2020-11-27 DIAGNOSIS — Z124 Encounter for screening for malignant neoplasm of cervix: Secondary | ICD-10-CM | POA: Insufficient documentation

## 2020-11-27 DIAGNOSIS — Z1151 Encounter for screening for human papillomavirus (HPV): Secondary | ICD-10-CM | POA: Insufficient documentation

## 2020-11-27 DIAGNOSIS — Z0000 Encounter for general adult medical examination without abnormal findings: Secondary | ICD-10-CM | POA: Diagnosis not present

## 2020-11-27 DIAGNOSIS — Z1159 Encounter for screening for other viral diseases: Secondary | ICD-10-CM | POA: Diagnosis not present

## 2020-11-27 DIAGNOSIS — Z01419 Encounter for gynecological examination (general) (routine) without abnormal findings: Secondary | ICD-10-CM | POA: Insufficient documentation

## 2020-11-27 DIAGNOSIS — R7303 Prediabetes: Secondary | ICD-10-CM | POA: Diagnosis not present

## 2020-11-27 DIAGNOSIS — E559 Vitamin D deficiency, unspecified: Secondary | ICD-10-CM | POA: Diagnosis not present

## 2020-11-29 LAB — CYTOLOGY - PAP
Comment: NEGATIVE
Diagnosis: NEGATIVE
High risk HPV: NEGATIVE

## 2020-11-29 NOTE — Progress Notes (Deleted)
Cardiology Office Note:    Date:  0/30/0923   ID:  Tasha, Cox [birth year], MRN [id number]  PCP:  Caren Macadam, MD  Cardiologist:  None  Electrophysiologist:  None   Referring MD: Caren Macadam, MD   Chief Complaint  Patient presents with  . Follow-up    12 months.    History of Present Illness:    [name] is a 65 y.o. female with a hx of breast cancer, hyperlipidemia who presents for follow-up.  She was referred by Dr. Mannie Stabile for evaluation of heart murmur, initially seen on 10/24/2019.  She reports that she followed at [facility] as a child, was told she had an issue with her aortic valve.  She had a heart catheterization done in middle school.  States that she followed with cardiology there until college, has not been seen for 40 years.  Reports that she was told that everything resolved and she did not need further cardiology follow-up.  She denies any chest pain or dyspnea.  States that she walks about twice per week, usually from 45 minutes to an hour.  Prior to pandemic was walking for 1 to 2 hours.  She denies any lightheadedness, syncope, or palpitations.  Does report occasional lower extremity edema.  No history of smoking.  Family history includes mother died of MI at age 65, and brother had CABG/AVR and died at age 49.  TTE 2007-02-08 showed LVEF 55%, mild LV dilatation, mild MR, mild AI.  TTE on 11/14/2019 showed LVEF 65 to 70%, indeterminate diastolic filling, normal RV systolic function, mild aortic stenosis, mild aortic regurgitation, mild mitral regurgitation.  Since last clinic visit,  she reports that she is doing well.  She is walking twice per week for 30 to 60 minutes.  She denies any exertional chest pain or dyspnea.  Denies any syncope, lower extremity edema, or palpitations.  Does report intermittent lightheadedness.  Past Medical History:  Diagnosis Date  . Breast cancer ([place]) 2004   right breast; ER/PR+, Her2+  . Cancer [facility]) April 2004    right breast lumpectomy  . GERD (gastroesophageal reflux disease)   . Hyperlipidemia   . Personal history of chemotherapy 11/19/2002  . Personal history of radiation therapy 02/19/2003    Past Surgical History:  Procedure Laterality Date  . BREAST BIOPSY Right 2019  . BREAST LUMPECTOMY Right   . BREAST SURGERY  10/2002   lumpectomy / right side    Current Medications: Current Meds  Medication Sig  . ergocalciferol (VITAMIN D2) 1.25 MG (50000 UT) capsule Take 50,000 Units by mouth once a week.   Current Facility-Administered Medications for the 11/30/20 encounter (Office Visit) with Donato Heinz, MD  Medication  . 0.9 %  sodium chloride infusion     Allergies:   Patient has no known allergies.   Social History   Socioeconomic History  . Marital status: Divorced    Spouse name: Not on file  . Number of children: Not on file  . Years of education: Not on file  . Highest education level: Not on file  Occupational History  . Not on file  Tobacco Use  . Smoking status: Never Smoker  . Smokeless tobacco: Never Used  Substance and Sexual Activity  . Alcohol use: Yes    Alcohol/week: 1.0 - 2.0 standard drink    Types: 1 - 2 Glasses of wine per week    Comment: occasional  . Drug use: No  . Sexual activity: Not  on file  Other Topics Concern  . Not on file  Social History Narrative  . Not on file   Social Determinants of Health   Financial Resource Strain: Not on file  Food Insecurity: Not on file  Transportation Needs: Not on file  Physical Activity: Not on file  Stress: Not on file  Social Connections: Not on file     Family History: The patient's family history includes Alzheimer's disease in her paternal aunt and paternal uncle; Bladder Cancer in her brother; Diabetes in her brother, maternal aunt, maternal uncle, and mother; Heart Problems in her maternal uncle; Heart attack in her maternal grandfather, paternal grandfather, and paternal  grandmother; Heart disease in her brother, maternal aunt, mother, and paternal aunt; Kidney failure in her brother and mother; Lung cancer (age of onset: 6) in her father; Skin cancer in her father and paternal uncle. There is no history of Breast cancer.  ROS:   Please see the history of present illness.     All other systems reviewed and are negative.  EKGs/Labs/Other Studies Reviewed:    The following studies were reviewed today:   EKG:  EKG is not ordered today.  The ekg ordered most recently demonstrates normal sinus rhythm, rate 73, Q waves in V1/2, no ST/T abnormalities  TTE 11/14/19: 1. Left ventricular ejection fraction, by estimation, is 65 to 70%. The  left ventricle has normal function. The left ventricle has no regional  wall motion abnormalities. Indeterminate diastolic filling due to E-A  fusion.  2. Right ventricular systolic function is normal. The right ventricular  size is normal. There is normal pulmonary artery systolic pressure.  3. The mitral valve is normal in structure. Mild mitral valve  regurgitation.  4. AV is thickened, calcified Peak and mean gradients through the valve  are 29 and 17 mm Hg respectively consistent with mild aortic stenosis..  The aortic valve is tricuspid. Aortic valve regurgitation is mild. Mild  aortic valve stenosis.  5. The inferior vena cava is normal in size with greater than 50%  respiratory variability, suggesting right atrial pressure of 3 mmHg.   Recent Labs: No results found for requested labs within last 8760 hours.  Recent Lipid Panel No results found for: CHOL, TRIG, HDL, CHOLHDL, VLDL, LDLCALC, LDLDIRECT  Physical Exam:    VS:  BP 140/82   Ht 5' 3"  (1.6 m)   Wt 155 lb (70.3 kg)   BMI 27.46 kg/m     Wt Readings from Last 3 Encounters:  11/30/20 155 lb (70.3 kg)  11/29/19 160 lb 6.4 oz (72.8 kg)  10/24/19 153 lb 3.2 oz (69.5 kg)     GEN:  Well nourished, well developed in no acute distress HEENT:  Normal NECK: No JVD CARDIAC: RRR, 2/6 systolic heart murmur RESPIRATORY:  Clear to auscultation without rales, wheezing or rhonchi  ABDOMEN: Soft, non-tender, non-distended MUSCULOSKELETAL:  No edema; No deformity  SKIN: Warm and dry NEUROLOGIC:  Alert and oriented x 3 PSYCHIATRIC:  Normal affect   ASSESSMENT:    No diagnosis found. PLAN:    Valvular disease: Mild aortic regurgitation/stenosis and mild mitral regurgitation on TTE in 11/14/2019.  Plan to repeat echo for monitoring in 2 years, or sooner if change in symptoms  Hyperlipidemia: LDL 145 on 11/27/20.  10 year ASCVD risk score 4%.  Does not meet indication for statin.  Diet and exercise recommended.  RTC in 1 year  Medication Adjustments/Labs and Tests Ordered: Current medicines are reviewed at length with  the patient today.  Concerns regarding medicines are outlined above.  No orders of the defined types were placed in this encounter.  No orders of the defined types were placed in this encounter.   There are no Patient Instructions on file for this visit.   Signed, Donato Heinz, MD  11/30/2020 4:33 PM    [facility]

## 2020-11-30 ENCOUNTER — Other Ambulatory Visit: Payer: Self-pay

## 2020-11-30 ENCOUNTER — Ambulatory Visit: Payer: Medicare Other | Admitting: Cardiology

## 2020-11-30 ENCOUNTER — Encounter: Payer: Self-pay | Admitting: Cardiology

## 2020-11-30 VITALS — BP 140/82 | Ht 63.0 in | Wt 155.0 lb

## 2020-11-30 DIAGNOSIS — I447 Left bundle-branch block, unspecified: Secondary | ICD-10-CM | POA: Diagnosis not present

## 2020-11-30 DIAGNOSIS — I350 Nonrheumatic aortic (valve) stenosis: Secondary | ICD-10-CM | POA: Diagnosis not present

## 2020-11-30 DIAGNOSIS — E785 Hyperlipidemia, unspecified: Secondary | ICD-10-CM | POA: Diagnosis not present

## 2020-11-30 DIAGNOSIS — R030 Elevated blood-pressure reading, without diagnosis of hypertension: Secondary | ICD-10-CM

## 2020-11-30 DIAGNOSIS — R9431 Abnormal electrocardiogram [ECG] [EKG]: Secondary | ICD-10-CM

## 2020-11-30 NOTE — Progress Notes (Signed)
Cardiology Office Note:    Date:  6/38/1771   ID:  Shalan, Neault [birth year], MRN [id number]  PCP:  Caren Macadam, MD  Cardiologist:  None  Electrophysiologist:  None   Referring MD: Caren Macadam, MD   Chief Complaint  Patient presents with  . Follow-up    12 months.  . Abnormal ECG    History of Present Illness:    [name] is a 65 y.o. female with a hx of breast cancer, hyperlipidemia who presents for follow-up.  She was referred by Dr. Mannie Stabile for evaluation of heart murmur, initially seen on 10/24/2019.  She reports that she followed at [facility] as a child, was told she had an issue with her aortic valve.  She had a heart catheterization done in middle school.  States that she followed with cardiology there until college, has not been seen for 40 years.  Reports that she was told that everything resolved and she did not need further cardiology follow-up.  She denies any chest pain or dyspnea.  States that she walks about twice per week, usually from 45 minutes to an hour.  Prior to pandemic was walking for 1 to 2 hours.  She denies any lightheadedness, syncope, or palpitations.  Does report occasional lower extremity edema.  No history of smoking.  Family history includes mother died of MI at age 39, and brother had CABG/AVR and died at age 61.  TTE 2007-02-25 showed LVEF 55%, mild LV dilatation, mild MR, mild AI.  TTE on 11/14/2019 showed LVEF 65 to 70%, indeterminate diastolic filling, normal RV systolic function, mild aortic stenosis, mild aortic regurgitation, mild mitral regurgitation.  Since last clinic visit, she reports that she is doing well, and has no new complaints. She is now retired as of 09/2020. For exercise, she joined a gym in [place] and is using water aerobics and weight machines about 4-6 times a week. She occasionally checks her blood pressure at home and believes it to be stable. At clinic today her cholesterol is mildly elevated. However, she  plans to continue working on her health now that she is retired, and thinks that will help lower her cholesterol. She denies any exertional chest pain or dyspnea. Also denies any syncope, lower extremity edema, or palpitations.  Does report intermittent lightheadedness.  Past Medical History:  Diagnosis Date  . Breast cancer ([place]) 2004   right breast; ER/PR+, Her2+  . Cancer [facility]) April 2004   right breast lumpectomy  . GERD (gastroesophageal reflux disease)   . Hyperlipidemia   . Personal history of chemotherapy 11/19/2002  . Personal history of radiation therapy 02/19/2003    Past Surgical History:  Procedure Laterality Date  . BREAST BIOPSY Right 2019  . BREAST LUMPECTOMY Right   . BREAST SURGERY  10/2002   lumpectomy / right side    Current Medications: Current Meds  Medication Sig  . ergocalciferol (VITAMIN D2) 1.25 MG (50000 UT) capsule Take 50,000 Units by mouth once a week.   Current Facility-Administered Medications for the 11/30/20 encounter (Office Visit) with Donato Heinz, MD  Medication  . 0.9 %  sodium chloride infusion     Allergies:   Patient has no known allergies.   Social History   Socioeconomic History  . Marital status: Divorced    Spouse name: Not on file  . Number of children: Not on file  . Years of education: Not on file  . Highest education level: Not on file  Occupational History  . Not on file  Tobacco Use  . Smoking status: Never Smoker  . Smokeless tobacco: Never Used  Substance and Sexual Activity  . Alcohol use: Yes    Alcohol/week: 1.0 - 2.0 standard drink    Types: 1 - 2 Glasses of wine per week    Comment: occasional  . Drug use: No  . Sexual activity: Not on file  Other Topics Concern  . Not on file  Social History Narrative  . Not on file   Social Determinants of Health   Financial Resource Strain: Not on file  Food Insecurity: Not on file  Transportation Needs: Not on file  Physical Activity: Not on file   Stress: Not on file  Social Connections: Not on file     Family History: The patient's family history includes Alzheimer's disease in her paternal aunt and paternal uncle; Bladder Cancer in her brother; Diabetes in her brother, maternal aunt, maternal uncle, and mother; Heart Problems in her maternal uncle; Heart attack in her maternal grandfather, paternal grandfather, and paternal grandmother; Heart disease in her brother, maternal aunt, mother, and paternal aunt; Kidney failure in her brother and mother; Lung cancer (age of onset: 39) in her father; Skin cancer in her father and paternal uncle. There is no history of Breast cancer.  ROS:   Please see the history of present illness.    All other systems reviewed and are negative.  EKGs/Labs/Other Studies Reviewed:    The following studies were reviewed today:   EKG:   11/27/2020 (PCP Office): NSR, rate 69 bpm, LBBB 11/29/2019: EKG was not ordered.   10/24/2019: normal sinus rhythm, rate 73, Q waves in V1/2, no ST/T abnormalities  TTE 11/14/19: 1. Left ventricular ejection fraction, by estimation, is 65 to 70%. The  left ventricle has normal function. The left ventricle has no regional  wall motion abnormalities. Indeterminate diastolic filling due to E-A  fusion.  2. Right ventricular systolic function is normal. The right ventricular  size is normal. There is normal pulmonary artery systolic pressure.  3. The mitral valve is normal in structure. Mild mitral valve  regurgitation.  4. AV is thickened, calcified Peak and mean gradients through the valve  are 29 and 17 mm Hg respectively consistent with mild aortic stenosis..  The aortic valve is tricuspid. Aortic valve regurgitation is mild. Mild  aortic valve stenosis.  5. The inferior vena cava is normal in size with greater than 50%  respiratory variability, suggesting right atrial pressure of 3 mmHg.   Recent Labs: No results found for requested labs within last 8760  hours.  Recent Lipid Panel No results found for: CHOL, TRIG, HDL, CHOLHDL, VLDL, LDLCALC, LDLDIRECT  Physical Exam:    VS:  BP 140/82   Ht 5' 3"  (1.6 m)   Wt 155 lb (70.3 kg)   BMI 27.46 kg/m     Wt Readings from Last 3 Encounters:  11/30/20 155 lb (70.3 kg)  11/29/19 160 lb 6.4 oz (72.8 kg)  10/24/19 153 lb 3.2 oz (69.5 kg)     GEN:  Well nourished, well developed in no acute distress HEENT: Normal NECK: No JVD CARDIAC: RRR, 2/6 systolic heart murmur loudest at the RUSB RESPIRATORY:  Clear to auscultation without rales, wheezing or rhonchi  ABDOMEN: Soft, non-tender, non-distended MUSCULOSKELETAL:  No edema; No deformity  SKIN: Warm and dry NEUROLOGIC:  Alert and oriented x 3 PSYCHIATRIC:  Normal affect   ASSESSMENT:    1. Abnormal EKG  2. LBBB (left bundle branch block)   3. Aortic stenosis, mild   4. Hyperlipidemia, unspecified hyperlipidemia type   5. Elevated BP without diagnosis of hypertension    PLAN:    LBBB: New diagnosis.  Will check echocardiogram  Valvular disease: Mild aortic regurgitation/stenosis and mild mitral regurgitation on TTE in 11/14/2019.  Plan to repeat echo as above  Hyperlipidemia: LDL 145 on 11/27/20.  10 year ASCVD risk score 7%, borderline for meeting indication to start statin.  Will check calcium score for further risk stratification.  Elevated BP: BP mildly elevated in clinic today, no history of hypertension.  Asked patient to check BP twice daily for next week and call with results.  RTC in 1 year  Medication Adjustments/Labs and Tests Ordered: Current medicines are reviewed at length with the patient today.  Concerns regarding medicines are outlined above.  Orders Placed This Encounter  Procedures  . CT CARDIAC SCORING (SELF PAY ONLY)  . ECHOCARDIOGRAM COMPLETE   No orders of the defined types were placed in this encounter.   Patient Instructions  Medication Instructions:  Your physician recommends that you continue on  your current medications as directed. Please refer to the Current Medication list given to you today.  *If you need a refill on your cardiac medications before your next appointment, please call your pharmacy*  Testing/Procedures: Your physician has requested that you have an echocardiogram. Echocardiography is a painless test that uses sound waves to create images of your heart. It provides your doctor with information about the size and shape of your heart and how well your heart's chambers and valves are working. This procedure takes approximately one hour. There are no restrictions for this procedure.  CT coronary calcium score. This test is done at [address]. This is $99 out of pocket.   Coronary CalciumScan A coronary calcium scan is an imaging test used to look for deposits of calcium and other fatty materials (plaques) in the inner lining of the blood vessels of the heart (coronary arteries). These deposits of calcium and plaques can partly clog and narrow the coronary arteries without producing any symptoms or warning signs. This puts a person at risk for a heart attack. This test can detect these deposits before symptoms develop. Tell a health care provider about:  Any allergies you have.  All medicines you are taking, including vitamins, herbs, eye drops, creams, and over-the-counter medicines.  Any problems you or family members have had with anesthetic medicines.  Any blood disorders you have.  Any surgeries you have had.  Any medical conditions you have.  Whether you are pregnant or may be pregnant. What are the risks? Generally, this is a safe procedure. However, problems may occur, including:  Harm to a pregnant woman and her unborn baby. This test involves the use of radiation. Radiation exposure can be dangerous to a pregnant woman and her unborn baby. If you are pregnant, you generally should not have this procedure done.  Slight increase in  the risk of cancer. This is because of the radiation involved in the test. What happens before the procedure? No preparation is needed for this procedure. What happens during the procedure?  You will undress and remove any jewelry around your neck or chest.  You will put on a hospital gown.  Sticky electrodes will be placed on your chest. The electrodes will be connected to an electrocardiogram (ECG) machine to record a tracing of the electrical activity of your  heart.  A CT scanner will take pictures of your heart. During this time, you will be asked to lie still and hold your breath for 2-3 seconds while a picture of your heart is being taken. The procedure may vary among health care providers and hospitals. What happens after the procedure?  You can get dressed.  You can return to your normal activities.  It is up to you to get the results of your test. Ask your health care provider, or the department that is doing the test, when your results will be ready. Summary  A coronary calcium scan is an imaging test used to look for deposits of calcium and other fatty materials (plaques) in the inner lining of the blood vessels of the heart (coronary arteries).  Generally, this is a safe procedure. Tell your health care provider if you are pregnant or may be pregnant.  No preparation is needed for this procedure.  A CT scanner will take pictures of your heart.  You can return to your normal activities after the scan is done. This information is not intended to replace advice given to you by your health care provider. Make sure you discuss any questions you have with your health care provider. Document Released: 01/03/2008 Document Revised: 05/26/2016 Document Reviewed: 05/26/2016 Elsevier Interactive Patient Education  2017 [place]: At [facility], you and your health needs are our priority.  As part of our continuing mission to provide you with exceptional heart  care, we have created designated Provider Care Teams.  These Care Teams include your primary Cardiologist (physician) and Advanced Practice Providers (APPs -  Physician Assistants and Nurse Practitioners) who all work together to provide you with the care you need, when you need it.  We recommend signing up for the patient portal called "MyChart".  Sign up information is provided on this After Visit Summary.  MyChart is used to connect with patients for Virtual Visits (Telemedicine).  Patients are able to view lab/test results, encounter notes, upcoming appointments, etc.  Non-urgent messages can be sent to your provider as well.   To learn more about what you can do with MyChart, go to [email or web address].    Your next appointment:   12 month(s)  The format for your next appointment:   In Person  Provider:   Oswaldo Milian, MD        [facility],acting as a scribe for Donato Heinz, MD.,have documented all relevant documentation on the behalf of Donato Heinz, MD,as directed by  Donato Heinz, MD while in the presence of Donato Heinz, MD.  I, Donato Heinz, MD, have reviewed all documentation for this visit. The documentation on 11/30/20 for the exam, diagnosis, procedures, and orders are all accurate and complete.   Signed, Donato Heinz, MD  11/30/2020 5:43 PM    [facility]

## 2020-11-30 NOTE — Patient Instructions (Addendum)
Medication Instructions:  Your physician recommends that you continue on your current medications as directed. Please refer to the Current Medication list given to you today.  *If you need a refill on your cardiac medications before your next appointment, please call your pharmacy*  Testing/Procedures: Your physician has requested that you have an echocardiogram. Echocardiography is a painless test that uses sound waves to create images of your heart. It provides your doctor with information about the size and shape of your heart and how well your heart's chambers and valves are working. This procedure takes approximately one hour. There are no restrictions for this procedure.  CT coronary calcium score. This test is done at [address]. This is $99 out of pocket.   Coronary CalciumScan A coronary calcium scan is an imaging test used to look for deposits of calcium and other fatty materials (plaques) in the inner lining of the blood vessels of the heart (coronary arteries). These deposits of calcium and plaques can partly clog and narrow the coronary arteries without producing any symptoms or warning signs. This puts a person at risk for a heart attack. This test can detect these deposits before symptoms develop. Tell a health care provider about:  Any allergies you have.  All medicines you are taking, including vitamins, herbs, eye drops, creams, and over-the-counter medicines.  Any problems you or family members have had with anesthetic medicines.  Any blood disorders you have.  Any surgeries you have had.  Any medical conditions you have.  Whether you are pregnant or may be pregnant. What are the risks? Generally, this is a safe procedure. However, problems may occur, including:  Harm to a pregnant woman and her unborn baby. This test involves the use of radiation. Radiation exposure can be dangerous to a pregnant woman and her unborn baby. If you are pregnant,  you generally should not have this procedure done.  Slight increase in the risk of cancer. This is because of the radiation involved in the test. What happens before the procedure? No preparation is needed for this procedure. What happens during the procedure?  You will undress and remove any jewelry around your neck or chest.  You will put on a hospital gown.  Sticky electrodes will be placed on your chest. The electrodes will be connected to an electrocardiogram (ECG) machine to record a tracing of the electrical activity of your heart.  A CT scanner will take pictures of your heart. During this time, you will be asked to lie still and hold your breath for 2-3 seconds while a picture of your heart is being taken. The procedure may vary among health care providers and hospitals. What happens after the procedure?  You can get dressed.  You can return to your normal activities.  It is up to you to get the results of your test. Ask your health care provider, or the department that is doing the test, when your results will be ready. Summary  A coronary calcium scan is an imaging test used to look for deposits of calcium and other fatty materials (plaques) in the inner lining of the blood vessels of the heart (coronary arteries).  Generally, this is a safe procedure. Tell your health care provider if you are pregnant or may be pregnant.  No preparation is needed for this procedure.  A CT scanner will take pictures of your heart.  You can return to your normal activities after the scan is done. This information is  not intended to replace advice given to you by your health care provider. Make sure you discuss any questions you have with your health care provider. Document Released: 01/03/2008 Document Revised: 05/26/2016 Document Reviewed: 05/26/2016 Elsevier Interactive Patient Education  2017 [place]: At [facility], you and your health needs are our priority.   As part of our continuing mission to provide you with exceptional heart care, we have created designated Provider Care Teams.  These Care Teams include your primary Cardiologist (physician) and Advanced Practice Providers (APPs -  Physician Assistants and Nurse Practitioners) who all work together to provide you with the care you need, when you need it.  We recommend signing up for the patient portal called "MyChart".  Sign up information is provided on this After Visit Summary.  MyChart is used to connect with patients for Virtual Visits (Telemedicine).  Patients are able to view lab/test results, encounter notes, upcoming appointments, etc.  Non-urgent messages can be sent to your provider as well.   To learn more about what you can do with MyChart, go to [email or web address].    Your next appointment:   12 month(s)  The format for your next appointment:   In Person  Provider:   Oswaldo Milian, MD

## 2020-12-28 DIAGNOSIS — F321 Major depressive disorder, single episode, moderate: Secondary | ICD-10-CM | POA: Diagnosis not present

## 2020-12-28 DIAGNOSIS — R7303 Prediabetes: Secondary | ICD-10-CM | POA: Diagnosis not present

## 2020-12-28 DIAGNOSIS — E7800 Pure hypercholesterolemia, unspecified: Secondary | ICD-10-CM | POA: Diagnosis not present

## 2021-01-16 ENCOUNTER — Other Ambulatory Visit: Payer: Self-pay

## 2021-01-16 ENCOUNTER — Ambulatory Visit (INDEPENDENT_AMBULATORY_CARE_PROVIDER_SITE_OTHER)
Admission: RE | Admit: 2021-01-16 | Discharge: 2021-01-16 | Disposition: A | Discharge status: Home / Self Care | Payer: Self-pay | Source: Ambulatory Visit | Attending: Cardiology | Admitting: Cardiology

## 2021-01-16 ENCOUNTER — Ambulatory Visit (HOSPITAL_COMMUNITY): Payer: Medicare Other | Attending: Internal Medicine

## 2021-01-16 DIAGNOSIS — I350 Nonrheumatic aortic (valve) stenosis: Secondary | ICD-10-CM | POA: Diagnosis not present

## 2021-01-16 DIAGNOSIS — R9431 Abnormal electrocardiogram [ECG] [EKG]: Secondary | ICD-10-CM | POA: Diagnosis not present

## 2021-01-16 DIAGNOSIS — E785 Hyperlipidemia, unspecified: Secondary | ICD-10-CM

## 2021-01-16 LAB — ECHOCARDIOGRAM COMPLETE
AR max vel: 0.73 cm2
AV Area VTI: 0.78 cm2
AV Area mean vel: 0.64 cm2
AV Mean grad: 20 mmHg
AV Peak grad: 32.9 mmHg
Ao pk vel: 2.87 m/s
Area-P 1/2: 3.83 cm2
P 1/2 time: 610 msec
S' Lateral: 2.7 cm

## 2021-01-18 ENCOUNTER — Telehealth: Payer: Self-pay | Admitting: Cardiology

## 2021-01-18 NOTE — Telephone Encounter (Signed)
Patient aware of echo and calcium score results.     Appt scheduled with Dr. Gardiner Rhyme 7/21

## 2021-01-18 NOTE — Telephone Encounter (Signed)
Patient is returning call to discuss echo results. °

## 2021-02-03 NOTE — Progress Notes (Deleted)
Cardiology Office Note:    Date:  08/08/1476   ID:  [name] [id number], MRN [id number]  PCP:  Caren Macadam, MD  Cardiologist:  None  Electrophysiologist:  None   Referring MD: Caren Macadam, MD   No chief complaint on file.   History of Present Illness:    [name] is a 65 y.o. female with a hx of breast cancer, hyperlipidemia who presents for follow-up.  She was referred by Dr. Mannie Stabile for evaluation of heart murmur, initially seen on 10/24/2019.  She reports that she followed at [facility] as a child, was told she had an issue with her aortic valve.  She had a heart catheterization done in middle school.  States that she followed with cardiology there until college, has not been seen for 40 years.  Reports that she was told that everything resolved and she did not need further cardiology follow-up.  She denies any chest pain or dyspnea.  States that she walks about twice per week, usually from 45 minutes to an hour.  Prior to pandemic was walking for 1 to 2 hours.  She denies any lightheadedness, syncope, or palpitations.  Does report occasional lower extremity edema.  No history of smoking.  Family history includes mother died of MI at age 19, and brother had CABG/AVR and died at age 28.  TTE 02/15/2007 showed LVEF 55%, mild LV dilatation, mild MR, mild AI.  TTE on 11/14/2019 showed LVEF 65 to 70%, indeterminate diastolic filling, normal RV systolic function, mild aortic stenosis, mild aortic regurgitation, mild mitral regurgitation.  Echocardiogram on 01/16/2021 showed EF 40 to 45%, mild RV dysfunction, moderate to severe aortic stenosis (V-max 3.0 m/s, mean gradient 20 mmHg, AVA 0.8 cm, DI 0.24).  Since last clinic visit,   she reports that she is doing well, and has no new complaints. She is now retired as of 09/2020. For exercise, she joined a gym in [place] and is using water aerobics and weight machines about 4-6 times a week. She occasionally checks her  blood pressure at home and believes it to be stable. At clinic today her cholesterol is mildly elevated. However, she plans to continue working on her health now that she is retired, and thinks that will help lower her cholesterol. She denies any exertional chest pain or dyspnea. Also denies any syncope, lower extremity edema, or palpitations.  Does report intermittent lightheadedness.  Past Medical History:  Diagnosis Date   Breast cancer [facility]) 2004   right breast; ER/PR+, Her2+   Cancer ([place]) April 2004   right breast lumpectomy   GERD (gastroesophageal reflux disease)    Hyperlipidemia    Personal history of chemotherapy 11/19/2002   Personal history of radiation therapy 02/19/2003    Past Surgical History:  Procedure Laterality Date   BREAST BIOPSY Right 2019   BREAST LUMPECTOMY Right    BREAST SURGERY  10/2002   lumpectomy / right side    Current Medications: No outpatient medications have been marked as taking for the 02/07/21 encounter (Appointment) with Donato Heinz, MD.   Current Facility-Administered Medications for the 02/07/21 encounter (Appointment) with Donato Heinz, MD  Medication   0.9 %  sodium chloride infusion     Allergies:   Patient has no known allergies.   Social History   Socioeconomic History   Marital status: Divorced    Spouse name: Not on file   Number of children: Not on file   Years of education: Not  on file   Highest education level: Not on file  Occupational History   Not on file  Tobacco Use   Smoking status: Never   Smokeless tobacco: Never  Substance and Sexual Activity   Alcohol use: Yes    Alcohol/week: 1.0 - 2.0 standard drink    Types: 1 - 2 Glasses of wine per week    Comment: occasional   Drug use: No   Sexual activity: Not on file  Other Topics Concern   Not on file  Social History Narrative   Not on file   Social Determinants of Health   Financial Resource Strain: Not on file  Food Insecurity:  Not on file  Transportation Needs: Not on file  Physical Activity: Not on file  Stress: Not on file  Social Connections: Not on file     Family History: The patient's family history includes Alzheimer's disease in her paternal aunt and paternal uncle; Bladder Cancer in her brother; Diabetes in her brother, maternal aunt, maternal uncle, and mother; Heart Problems in her maternal uncle; Heart attack in her maternal grandfather, paternal grandfather, and paternal grandmother; Heart disease in her brother, maternal aunt, mother, and paternal aunt; Kidney failure in her brother and mother; Lung cancer (age of onset: 32) in her father; Skin cancer in her father and paternal uncle. There is no history of Breast cancer.  ROS:   Please see the history of present illness.    All other systems reviewed and are negative.  EKGs/Labs/Other Studies Reviewed:    The following studies were reviewed today:   EKG:   11/27/2020 (PCP Office): NSR, rate 69 bpm, LBBB 11/29/2019: EKG was not ordered.   10/24/2019: normal sinus rhythm, rate 73, Q waves in V1/2, no ST/T abnormalities  TTE 11/14/19:  1. Left ventricular ejection fraction, by estimation, is 65 to 70%. The  left ventricle has normal function. The left ventricle has no regional  wall motion abnormalities. Indeterminate diastolic filling due to E-A  fusion.   2. Right ventricular systolic function is normal. The right ventricular  size is normal. There is normal pulmonary artery systolic pressure.   3. The mitral valve is normal in structure. Mild mitral valve  regurgitation.   4. AV is thickened, calcified Peak and mean gradients through the valve  are 29 and 17 mm Hg respectively consistent with mild aortic stenosis..  The aortic valve is tricuspid. Aortic valve regurgitation is mild. Mild  aortic valve stenosis.   5. The inferior vena cava is normal in size with greater than 50%  respiratory variability, suggesting right atrial pressure of 3  mmHg.   Recent Labs: No results found for requested labs within last 8760 hours.  Recent Lipid Panel No results found for: CHOL, TRIG, HDL, CHOLHDL, VLDL, LDLCALC, LDLDIRECT  Physical Exam:    VS:  There were no vitals taken for this visit.    Wt Readings from Last 3 Encounters:  11/30/20 155 lb (70.3 kg)  11/29/19 160 lb 6.4 oz (72.8 kg)  10/24/19 153 lb 3.2 oz (69.5 kg)     GEN:  Well nourished, well developed in no acute distress HEENT: Normal NECK: No JVD CARDIAC: RRR, 2/6 systolic heart murmur loudest at the RUSB RESPIRATORY:  Clear to auscultation without rales, wheezing or rhonchi  ABDOMEN: Soft, non-tender, non-distended MUSCULOSKELETAL:  No edema; No deformity  SKIN: Warm and dry NEUROLOGIC:  Alert and oriented x 3 PSYCHIATRIC:  Normal affect   ASSESSMENT:    No diagnosis found.  PLAN:    Chronic combined systolic and diastolic heart failure:  new diagnosis.  Echocardiogram on 01/16/2021 showed EF 40 to 45%, mild RV dysfunction, moderate to severe aortic stenosis (V-max 3.0 m/s, mean gradient 20 mmHg, AVA 0.8 cm, DI 0.24). -RHC/LHC for further evaluation.  Would also plan to assess AS gradient  Moderate to severe aortic stenosis: Echocardiogram on 01/16/2021 showed EF 40 to 45%, mild RV dysfunction, moderate to severe aortic stenosis (V-max 3.0 m/s, mean gradient 20 mmHg, AVA 0.8 cm, DI 0.24).  AV calcium score only 365 on 01/16/21, argues against severe AS -Plan cath as above  Hyperlipidemia: LDL 145 on 11/27/20.  10 year ASCVD risk score 7%, borderline for meeting indication to start statin.  Caclium score 0 on 01/16/21  Elevated BP: BP mildly elevated in clinic today, no history of hypertension.  Asked patient to check BP twice daily for next week and call with results.  RTC in ***  Medication Adjustments/Labs and Tests Ordered: Current medicines are reviewed at length with the patient today.  Concerns regarding medicines are outlined above.  No orders of the  defined types were placed in this encounter.  No orders of the defined types were placed in this encounter.   There are no Patient Instructions on file for this visit.   I,Mathew Stumpf,acting as a Education administrator for Donato Heinz, MD.,have documented all relevant documentation on the behalf of Donato Heinz, MD,as directed by  Donato Heinz, MD while in the presence of Donato Heinz, MD.  I, Donato Heinz, MD, have reviewed all documentation for this visit. The documentation on 02/03/21 for the exam, diagnosis, procedures, and orders are all accurate and complete.   Signed, Donato Heinz, MD  02/03/2021 9:07 PM    [place]

## 2021-02-06 NOTE — H&P (View-Only) (Signed)
Cardiology Office Note:    Date:  0/92/3300   ID:  Tasha, Cox [birth year], MRN [id number]  PCP:  Caren Macadam, MD  Cardiologist:  None  Electrophysiologist:  None   Referring MD: Caren Macadam, MD   Chief Complaint  Patient presents with   Aortic Stenosis     History of Present Illness:    [name] is a 66 y.o. female with a hx of breast cancer, hyperlipidemia who presents for follow-up.  She was referred by Dr. Mannie Stabile for evaluation of heart murmur, initially seen on 10/24/2019.  She reports that she followed at [facility] as a child, was told she had an issue with her aortic valve.  She had a heart catheterization done in middle school.  States that she followed with cardiology there until college, has not been seen for 40 years.  Reports that she was told that everything resolved and she did not need further cardiology follow-up.  She denies any chest pain or dyspnea.  States that she walks about twice per week, usually from 45 minutes to an hour.  Prior to pandemic was walking for 1 to 2 hours.  She denies any lightheadedness, syncope, or palpitations.  Does report occasional lower extremity edema.  No history of smoking.  Family history includes mother died of MI at age 43, and brother had CABG/AVR and died at age 26.  TTE 02-10-2007 showed LVEF 55%, mild LV dilatation, mild MR, mild AI.  TTE on 11/14/2019 showed LVEF 65 to 70%, indeterminate diastolic filling, normal RV systolic function, mild aortic stenosis, mild aortic regurgitation, mild mitral regurgitation.  Echocardiogram on 01/16/2021 showed EF 40 to 45%, mild RV dysfunction, moderate to severe aortic stenosis (V-max 3.0 m/s, mean gradient 20 mmHg, AVA 0.8 cm, DI 0.24).  Since last clinic visit, she's had multiple episodes where she feels pressure in her chest. Episodes happen when she over exerts herself with walking. Denies any LE edema, palpitations, lightheadedness, shortness of breath or syncope. She  recently joined a water aerobics class and attends 4-5 times a week with no complications.  She discontinued her hiking group, that walks over 2 hours due to her recent symptoms. Her mother, aunt and brother has heart disease, diabetes, and kidney issues. She does not smoke.  Past Medical History:  Diagnosis Date   Breast cancer [facility]) 2004   right breast; ER/PR+, Her2+   Cancer [facility]) April 2004   right breast lumpectomy   GERD (gastroesophageal reflux disease)    Hyperlipidemia    Personal history of chemotherapy 11/19/2002   Personal history of radiation therapy 02/19/2003    Past Surgical History:  Procedure Laterality Date   BREAST BIOPSY Right 2019   BREAST LUMPECTOMY Right    BREAST SURGERY  10/2002   lumpectomy / right side    Current Medications: Current Meds  Medication Sig   carvedilol (COREG) 3.125 MG tablet Take 1 tablet (3.125 mg total) by mouth 2 (two) times daily.   escitalopram (LEXAPRO) 5 MG tablet Take 5 mg by mouth in the morning.   losartan (COZAAR) 25 MG tablet Take 1 tablet (25 mg total) by mouth daily. (Patient taking differently: Take 25 mg by mouth daily with supper.)     Allergies:   Patient has no known allergies.   Social History   Socioeconomic History   Marital status: Divorced    Spouse name: Not on file   Number of children: Not on file   Years of education: Not  on file   Highest education level: Not on file  Occupational History   Not on file  Tobacco Use   Smoking status: Never   Smokeless tobacco: Never  Substance and Sexual Activity   Alcohol use: Yes    Alcohol/week: 1.0 - 2.0 standard drink    Types: 1 - 2 Glasses of wine per week    Comment: occasional   Drug use: No   Sexual activity: Not on file  Other Topics Concern   Not on file  Social History Narrative   Not on file   Social Determinants of Health   Financial Resource Strain: Not on file  Food Insecurity: Not on file  Transportation Needs: Not on file  Physical  Activity: Not on file  Stress: Not on file  Social Connections: Not on file     Family History: The patient's family history includes Alzheimer's disease in her paternal aunt and paternal uncle; Bladder Cancer in her brother; Diabetes in her brother, maternal aunt, maternal uncle, and mother; Heart Problems in her maternal uncle; Heart attack in her maternal grandfather, paternal grandfather, and paternal grandmother; Heart disease in her brother, maternal aunt, mother, and paternal aunt; Kidney failure in her brother and mother; Lung cancer (age of onset: 85) in her father; Skin cancer in her father and paternal uncle. There is no history of Breast cancer.  ROS:   Please see the history of present illness. (+)   chest pressure  All other systems reviewed and are negative.  EKGs/Labs/Other Studies Reviewed:    The following studies were reviewed today:   EKG:   07/22:  NSR, rate 69, LVH 11/27/2020 (PCP Office): NSR, rate 69 bpm, LBBB 11/29/2019: EKG was not ordered.   10/24/2019: normal sinus rhythm, rate 73, Q waves in V1/2, no ST/T abnormalities  TTE 11/14/19:  1. Left ventricular ejection fraction, by estimation, is 65 to 70%. The  left ventricle has normal function. The left ventricle has no regional  wall motion abnormalities. Indeterminate diastolic filling due to E-A  fusion.   2. Right ventricular systolic function is normal. The right ventricular  size is normal. There is normal pulmonary artery systolic pressure.   3. The mitral valve is normal in structure. Mild mitral valve  regurgitation.   4. AV is thickened, calcified Peak and mean gradients through the valve  are 29 and 17 mm Hg respectively consistent with mild aortic stenosis..  The aortic valve is tricuspid. Aortic valve regurgitation is mild. Mild  aortic valve stenosis.   5. The inferior vena cava is normal in size with greater than 50%  respiratory variability, suggesting right atrial pressure of 3 mmHg.    Recent Labs: No results found for requested labs within last 8760 hours.  Recent Lipid Panel No results found for: CHOL, TRIG, HDL, CHOLHDL, VLDL, LDLCALC, LDLDIRECT  Physical Exam:    VS:  BP 122/78 (BP Location: Left Arm)   Pulse 69   Ht _0  (1.6 m)   Wt 152 lb 12.8 oz (69.3 kg)   SpO2 98%   BMI 27.07 kg/m     Wt Readings from Last 3 Encounters:  02/07/21 152 lb 12.8 oz (69.3 kg)  11/30/20 155 lb (70.3 kg)  11/29/19 160 lb 6.4 oz (72.8 kg)     GEN:  Well nourished, well developed in no acute distress HEENT: Normal NECK: No JVD CARDIAC: RRR,  2/6 systolic heart murmur loudest at the RUSB RESPIRATORY:  Clear to auscultation without rales, wheezing  or rhonchi  ABDOMEN: Soft, non-tender, non-distended MUSCULOSKELETAL:  No edema; No deformity  SKIN: Warm and dry NEUROLOGIC:  Alert and oriented x 3 PSYCHIATRIC:  Normal affect   ASSESSMENT:    1. Chronic combined systolic and diastolic heart failure ([place])   2. Aortic valve stenosis, etiology of cardiac valve disease unspecified   3. Essential hypertension   4. Hyperlipidemia, unspecified hyperlipidemia type   5. Pre-procedure lab exam     PLAN:    Chronic combined systolic and diastolic heart failure:  new diagnosis.  Echocardiogram on 01/16/2021 showed EF 40 to 45%, mild RV dysfunction, moderate to severe aortic stenosis (V-max 3.0 m/s, mean gradient 20 mmHg, AVA 0.8 cm, DI 0.24). -RHC/LHC for further evaluation.  Would also plan to assess AV gradient.  Scheduled with Dr Angelena Form 7/27.  Risks and benefits of cardiac catheterization have been discussed with the patient.  These include bleeding, infection, kidney damage, stroke, heart attack, death.  The patient understands these risks and is willing to proceed. -CTA chest TAVR protocol -Start Coreg 3.125 mg twice daily -Start losartan 25 mg  Moderate to severe aortic stenosis: Echocardiogram on 01/16/2021 showed EF 40 to 45%, mild RV dysfunction, moderate to severe  aortic stenosis (V-max 3.0 m/s, mean gradient 20 mmHg, AVA 0.8 cm, DI 0.24).  AV calcium score only 365 on 01/16/21, argues against severe AS -Plan cath as above  Hyperlipidemia: LDL 145 on 11/27/20.  10 year ASCVD risk score 7%, borderline for meeting indication to start statin.  Caclium score 0 on 01/16/21  Hypertension: BP elevated at last clinic visit, mildly elevated on home measurements as well up to SBP 140s.  Start carvedilol and losartan as above  RTC in 1 month   Medication Adjustments/Labs and Tests Ordered: Current medicines are reviewed at length with the patient today.  Concerns regarding medicines are outlined above.  Orders Placed This Encounter  Procedures   CT CORONARY MORPH W/CTA COR W/SCORE W/CA W/CM &/OR WO/CM   CT ANGIO CHEST AORTA W/CM & OR WO/CM   CT Angio Abd/Pel w/ and/or w/o   Basic metabolic panel   CBC   EKG 12-Lead    Meds ordered this encounter  Medications   losartan (COZAAR) 25 MG tablet    Sig: Take 1 tablet (25 mg total) by mouth daily.    Dispense:  90 tablet    Refill:  3   carvedilol (COREG) 3.125 MG tablet    Sig: Take 1 tablet (3.125 mg total) by mouth 2 (two) times daily.    Dispense:  180 tablet    Refill:  3     Patient Instructions  Medication Instructions:  START Losartan 25 mg once daily START carvedilol (Coreg) 3.125 mg two times daily  *If you need a refill on your cardiac medications before your next appointment, please call your pharmacy*   Lab Work: BMET, CBC today AND  Please return for labs in 2 weeks (BMET)  Our in office lab hours are Monday-Friday 8:00-4:00, closed for lunch 12:45-1:45 pm.  No appointment needed.  If you have labs (blood work) drawn today and your tests are completely normal, you will receive your results only by: [place] (if you have MyChart) OR A paper copy in the mail If you have any lab test that is abnormal or we need to change your treatment, we will call you to review the  results.   Testing/Procedures: Your physician has requested that you have a cardiac catheterization. Cardiac catheterization is  used to diagnose and/or treat various heart conditions. Doctors may recommend this procedure for a number of different reasons. The most common reason is to evaluate chest pain. Chest pain can be a symptom of coronary artery disease (CAD), and cardiac catheterization can show whether plaque is narrowing or blocking your heart's arteries. This procedure is also used to evaluate the valves, as well as measure the blood flow and oxygen levels in different parts of your heart. For further information please visit HugeFiesta.[email or web address]. Please follow instruction sheet, as given.  CCTA-see instructions  Follow-Up: At [facility], you and your health needs are our priority.  As part of our continuing mission to provide you with exceptional heart care, we have created designated Provider Care Teams.  These Care Teams include your primary Cardiologist (physician) and Advanced Practice Providers (APPs -  Physician Assistants and Nurse Practitioners) who all work together to provide you with the care you need, when you need it.  We recommend signing up for the patient portal called "MyChart".  Sign up information is provided on this After Visit Summary.  MyChart is used to connect with patients for Virtual Visits (Telemedicine).  Patients are able to view lab/test results, encounter notes, upcoming appointments, etc.  Non-urgent messages can be sent to your provider as well.   To learn more about what you can do with MyChart, go to [email or web address].    Your next appointment:   Tuesday, 8/16 at 1:30 PM with Dr. Seward Grater Bradford,acting as a scribe for Donato Heinz, MD.,have documented all relevant documentation on the behalf of Donato Heinz, MD,as directed by  Donato Heinz, MD while in the presence of Donato Heinz, MD.  I,  Donato Heinz, MD, have reviewed all documentation for this visit. The documentation on 02/07/21 for the exam, diagnosis, procedures, and orders are all accurate and complete.    Signed, Donato Heinz, MD  02/07/2021 5:45 PM    [place]

## 2021-02-06 NOTE — Progress Notes (Signed)
Cardiology Office Note:    Date:  0/92/3300   ID:  [name], [name] [birth year], MRN [id number]  PCP:  Caren Macadam, MD  Cardiologist:  None  Electrophysiologist:  None   Referring MD: Caren Macadam, MD   Chief Complaint  Patient presents with   Aortic Stenosis     History of Present Illness:    [name] is a 65 y.o. female with a hx of breast cancer, hyperlipidemia who presents for follow-up.  She was referred by Dr. Mannie Stabile for evaluation of heart murmur, initially seen on 10/24/2019.  She reports that she followed at [facility] as a child, was told she had an issue with her aortic valve.  She had a heart catheterization done in middle school.  States that she followed with cardiology there until college, has not been seen for 40 years.  Reports that she was told that everything resolved and she did not need further cardiology follow-up.  She denies any chest pain or dyspnea.  States that she walks about twice per week, usually from 45 minutes to an hour.  Prior to pandemic was walking for 1 to 2 hours.  She denies any lightheadedness, syncope, or palpitations.  Does report occasional lower extremity edema.  No history of smoking.  Family history includes mother died of MI at age 43, and brother had CABG/AVR and died at age 26.  TTE 02-10-2007 showed LVEF 55%, mild LV dilatation, mild MR, mild AI.  TTE on 11/14/2019 showed LVEF 65 to 70%, indeterminate diastolic filling, normal RV systolic function, mild aortic stenosis, mild aortic regurgitation, mild mitral regurgitation.  Echocardiogram on 01/16/2021 showed EF 40 to 45%, mild RV dysfunction, moderate to severe aortic stenosis (V-max 3.0 m/s, mean gradient 20 mmHg, AVA 0.8 cm, DI 0.24).  Since last clinic visit, she's had multiple episodes where she feels pressure in her chest. Episodes happen when she over exerts herself with walking. Denies any LE edema, palpitations, lightheadedness, shortness of breath or syncope. She  recently joined a water aerobics class and attends 4-5 times a week with no complications.  She discontinued her hiking group, that walks over 2 hours due to her recent symptoms. Her mother, aunt and brother has heart disease, diabetes, and kidney issues. She does not smoke.  Past Medical History:  Diagnosis Date   Breast cancer [facility]) 2004   right breast; ER/PR+, Her2+   Cancer [facility]) April 2004   right breast lumpectomy   GERD (gastroesophageal reflux disease)    Hyperlipidemia    Personal history of chemotherapy 11/19/2002   Personal history of radiation therapy 02/19/2003    Past Surgical History:  Procedure Laterality Date   BREAST BIOPSY Right 2019   BREAST LUMPECTOMY Right    BREAST SURGERY  10/2002   lumpectomy / right side    Current Medications: Current Meds  Medication Sig   carvedilol (COREG) 3.125 MG tablet Take 1 tablet (3.125 mg total) by mouth 2 (two) times daily.   escitalopram (LEXAPRO) 5 MG tablet Take 5 mg by mouth in the morning.   losartan (COZAAR) 25 MG tablet Take 1 tablet (25 mg total) by mouth daily. (Patient taking differently: Take 25 mg by mouth daily with supper.)     Allergies:   Patient has no known allergies.   Social History   Socioeconomic History   Marital status: Divorced    Spouse name: Not on file   Number of children: Not on file   Years of education: Not  on file   Highest education level: Not on file  Occupational History   Not on file  Tobacco Use   Smoking status: Never   Smokeless tobacco: Never  Substance and Sexual Activity   Alcohol use: Yes    Alcohol/week: 1.0 - 2.0 standard drink    Types: 1 - 2 Glasses of wine per week    Comment: occasional   Drug use: No   Sexual activity: Not on file  Other Topics Concern   Not on file  Social History Narrative   Not on file   Social Determinants of Health   Financial Resource Strain: Not on file  Food Insecurity: Not on file  Transportation Needs: Not on file  Physical  Activity: Not on file  Stress: Not on file  Social Connections: Not on file     Family History: The patient's family history includes Alzheimer's disease in her paternal aunt and paternal uncle; Bladder Cancer in her brother; Diabetes in her brother, maternal aunt, maternal uncle, and mother; Heart Problems in her maternal uncle; Heart attack in her maternal grandfather, paternal grandfather, and paternal grandmother; Heart disease in her brother, maternal aunt, mother, and paternal aunt; Kidney failure in her brother and mother; Lung cancer (age of onset: 85) in her father; Skin cancer in her father and paternal uncle. There is no history of Breast cancer.  ROS:   Please see the history of present illness. (+)   chest pressure  All other systems reviewed and are negative.  EKGs/Labs/Other Studies Reviewed:    The following studies were reviewed today:   EKG:   07/22:  NSR, rate 69, LVH 11/27/2020 (PCP Office): NSR, rate 69 bpm, LBBB 11/29/2019: EKG was not ordered.   10/24/2019: normal sinus rhythm, rate 73, Q waves in V1/2, no ST/T abnormalities  TTE 11/14/19:  1. Left ventricular ejection fraction, by estimation, is 65 to 70%. The  left ventricle has normal function. The left ventricle has no regional  wall motion abnormalities. Indeterminate diastolic filling due to E-A  fusion.   2. Right ventricular systolic function is normal. The right ventricular  size is normal. There is normal pulmonary artery systolic pressure.   3. The mitral valve is normal in structure. Mild mitral valve  regurgitation.   4. AV is thickened, calcified Peak and mean gradients through the valve  are 29 and 17 mm Hg respectively consistent with mild aortic stenosis..  The aortic valve is tricuspid. Aortic valve regurgitation is mild. Mild  aortic valve stenosis.   5. The inferior vena cava is normal in size with greater than 50%  respiratory variability, suggesting right atrial pressure of 3 mmHg.    Recent Labs: No results found for requested labs within last 8760 hours.  Recent Lipid Panel No results found for: CHOL, TRIG, HDL, CHOLHDL, VLDL, LDLCALC, LDLDIRECT  Physical Exam:    VS:  BP 122/78 (BP Location: Left Arm)   Pulse 69   Ht _0  (1.6 m)   Wt 152 lb 12.8 oz (69.3 kg)   SpO2 98%   BMI 27.07 kg/m     Wt Readings from Last 3 Encounters:  02/07/21 152 lb 12.8 oz (69.3 kg)  11/30/20 155 lb (70.3 kg)  11/29/19 160 lb 6.4 oz (72.8 kg)     GEN:  Well nourished, well developed in no acute distress HEENT: Normal NECK: No JVD CARDIAC: RRR,  2/6 systolic heart murmur loudest at the RUSB RESPIRATORY:  Clear to auscultation without rales, wheezing  or rhonchi  ABDOMEN: Soft, non-tender, non-distended MUSCULOSKELETAL:  No edema; No deformity  SKIN: Warm and dry NEUROLOGIC:  Alert and oriented x 3 PSYCHIATRIC:  Normal affect   ASSESSMENT:    1. Chronic combined systolic and diastolic heart failure ([place])   2. Aortic valve stenosis, etiology of cardiac valve disease unspecified   3. Essential hypertension   4. Hyperlipidemia, unspecified hyperlipidemia type   5. Pre-procedure lab exam     PLAN:    Chronic combined systolic and diastolic heart failure:  new diagnosis.  Echocardiogram on 01/16/2021 showed EF 40 to 45%, mild RV dysfunction, moderate to severe aortic stenosis (V-max 3.0 m/s, mean gradient 20 mmHg, AVA 0.8 cm, DI 0.24). -RHC/LHC for further evaluation.  Would also plan to assess AV gradient.  Scheduled with Dr Angelena Form 7/27.  Risks and benefits of cardiac catheterization have been discussed with the patient.  These include bleeding, infection, kidney damage, stroke, heart attack, death.  The patient understands these risks and is willing to proceed. -CTA chest TAVR protocol -Start Coreg 3.125 mg twice daily -Start losartan 25 mg  Moderate to severe aortic stenosis: Echocardiogram on 01/16/2021 showed EF 40 to 45%, mild RV dysfunction, moderate to severe  aortic stenosis (V-max 3.0 m/s, mean gradient 20 mmHg, AVA 0.8 cm, DI 0.24).  AV calcium score only 365 on 01/16/21, argues against severe AS -Plan cath as above  Hyperlipidemia: LDL 145 on 11/27/20.  10 year ASCVD risk score 7%, borderline for meeting indication to start statin.  Caclium score 0 on 01/16/21  Hypertension: BP elevated at last clinic visit, mildly elevated on home measurements as well up to SBP 140s.  Start carvedilol and losartan as above  RTC in 1 month   Medication Adjustments/Labs and Tests Ordered: Current medicines are reviewed at length with the patient today.  Concerns regarding medicines are outlined above.  Orders Placed This Encounter  Procedures   CT CORONARY MORPH W/CTA COR W/SCORE W/CA W/CM &/OR WO/CM   CT ANGIO CHEST AORTA W/CM & OR WO/CM   CT Angio Abd/Pel w/ and/or w/o   Basic metabolic panel   CBC   EKG 12-Lead    Meds ordered this encounter  Medications   losartan (COZAAR) 25 MG tablet    Sig: Take 1 tablet (25 mg total) by mouth daily.    Dispense:  90 tablet    Refill:  3   carvedilol (COREG) 3.125 MG tablet    Sig: Take 1 tablet (3.125 mg total) by mouth 2 (two) times daily.    Dispense:  180 tablet    Refill:  3     Patient Instructions  Medication Instructions:  START Losartan 25 mg once daily START carvedilol (Coreg) 3.125 mg two times daily  *If you need a refill on your cardiac medications before your next appointment, please call your pharmacy*   Lab Work: BMET, CBC today AND  Please return for labs in 2 weeks (BMET)  Our in office lab hours are Monday-Friday 8:00-4:00, closed for lunch 12:45-1:45 pm.  No appointment needed.  If you have labs (blood work) drawn today and your tests are completely normal, you will receive your results only by: [place] (if you have MyChart) OR A paper copy in the mail If you have any lab test that is abnormal or we need to change your treatment, we will call you to review the  results.   Testing/Procedures: Your physician has requested that you have a cardiac catheterization. Cardiac catheterization is  used to diagnose and/or treat various heart conditions. Doctors may recommend this procedure for a number of different reasons. The most common reason is to evaluate chest pain. Chest pain can be a symptom of coronary artery disease (CAD), and cardiac catheterization can show whether plaque is narrowing or blocking your heart's arteries. This procedure is also used to evaluate the valves, as well as measure the blood flow and oxygen levels in different parts of your heart. For further information please visit HugeFiesta.[email or web address]. Please follow instruction sheet, as given.  CCTA-see instructions  Follow-Up: At [facility], you and your health needs are our priority.  As part of our continuing mission to provide you with exceptional heart care, we have created designated Provider Care Teams.  These Care Teams include your primary Cardiologist (physician) and Advanced Practice Providers (APPs -  Physician Assistants and Nurse Practitioners) who all work together to provide you with the care you need, when you need it.  We recommend signing up for the patient portal called "MyChart".  Sign up information is provided on this After Visit Summary.  MyChart is used to connect with patients for Virtual Visits (Telemedicine).  Patients are able to view lab/test results, encounter notes, upcoming appointments, etc.  Non-urgent messages can be sent to your provider as well.   To learn more about what you can do with MyChart, go to [email or web address].    Your next appointment:   Tuesday, 8/16 at 1:30 PM with Dr. Seward Grater Bradford,acting as a scribe for Donato Heinz, MD.,have documented all relevant documentation on the behalf of Donato Heinz, MD,as directed by  Donato Heinz, MD while in the presence of Donato Heinz, MD.  I,  Donato Heinz, MD, have reviewed all documentation for this visit. The documentation on 02/07/21 for the exam, diagnosis, procedures, and orders are all accurate and complete.    Signed, Donato Heinz, MD  02/07/2021 5:45 PM    [place]

## 2021-02-07 ENCOUNTER — Encounter: Payer: Self-pay | Admitting: Cardiology

## 2021-02-07 ENCOUNTER — Other Ambulatory Visit: Payer: Self-pay

## 2021-02-07 ENCOUNTER — Ambulatory Visit: Payer: Medicare Other | Admitting: Cardiology

## 2021-02-07 VITALS — BP 122/78 | HR 69 | Ht 63.0 in | Wt 152.8 lb

## 2021-02-07 DIAGNOSIS — I350 Nonrheumatic aortic (valve) stenosis: Secondary | ICD-10-CM

## 2021-02-07 DIAGNOSIS — I5042 Chronic combined systolic (congestive) and diastolic (congestive) heart failure: Secondary | ICD-10-CM | POA: Diagnosis not present

## 2021-02-07 DIAGNOSIS — I10 Essential (primary) hypertension: Secondary | ICD-10-CM

## 2021-02-07 DIAGNOSIS — E785 Hyperlipidemia, unspecified: Secondary | ICD-10-CM

## 2021-02-07 DIAGNOSIS — Z01818 Encounter for other preprocedural examination: Secondary | ICD-10-CM

## 2021-02-07 DIAGNOSIS — Z01812 Encounter for preprocedural laboratory examination: Secondary | ICD-10-CM

## 2021-02-07 MED ORDER — LOSARTAN POTASSIUM 25 MG PO TABS: 25.0000 mg | ORAL_TABLET | Freq: Every day | ORAL | 3 refills | Status: DC

## 2021-02-07 MED ORDER — CARVEDILOL 3.125 MG PO TABS: 3.1250 mg | ORAL_TABLET | Freq: Two times a day (BID) | ORAL | 3 refills | Status: DC

## 2021-02-07 NOTE — Patient Instructions (Signed)
Medication Instructions:  START Losartan 25 mg once daily START carvedilol (Coreg) 3.125 mg two times daily  *If you need a refill on your cardiac medications before your next appointment, please call your pharmacy*   Lab Work: BMET, CBC today AND  Please return for labs in 2 weeks (BMET)  Our in office lab hours are Monday-Friday 8:00-4:00, closed for lunch 12:45-1:45 pm.  No appointment needed.  If you have labs (blood work) drawn today and your tests are completely normal, you will receive your results only by: [place] (if you have MyChart) OR A paper copy in the mail If you have any lab test that is abnormal or we need to change your treatment, we will call you to review the results.   Testing/Procedures: Your physician has requested that you have a cardiac catheterization. Cardiac catheterization is used to diagnose and/or treat various heart conditions. Doctors may recommend this procedure for a number of different reasons. The most common reason is to evaluate chest pain. Chest pain can be a symptom of coronary artery disease (CAD), and cardiac catheterization can show whether plaque is narrowing or blocking your heart's arteries. This procedure is also used to evaluate the valves, as well as measure the blood flow and oxygen levels in different parts of your heart. For further information please visit [email or web address]. Please follow instruction sheet, as given.  CCTA-see instructions  Follow-Up: At [facility], you and your health needs are our priority.  As part of our continuing mission to provide you with exceptional heart care, we have created designated Provider Care Teams.  These Care Teams include your primary Cardiologist (physician) and Advanced Practice Providers (APPs -  Physician Assistants and Nurse Practitioners) who all work together to provide you with the care you need, when you need it.  We recommend signing up for the patient portal called  "MyChart".  Sign up information is provided on this After Visit Summary.  MyChart is used to connect with patients for Virtual Visits (Telemedicine).  Patients are able to view lab/test results, encounter notes, upcoming appointments, etc.  Non-urgent messages can be sent to your provider as well.   To learn more about what you can do with MyChart, go to [email or web address].    Your next appointment:   Tuesday, 8/16 at 1:30 PM with Dr. Gardiner Rhyme

## 2021-02-08 LAB — CBC
Hematocrit: 40.4 % (ref 34.0–46.6)
Hemoglobin: 13.3 g/dL (ref 11.1–15.9)
MCH: 28.1 pg (ref 26.6–33.0)
MCHC: 32.9 g/dL (ref 31.5–35.7)
MCV: 85 fL (ref 79–97)
Platelets: 301 10*3/uL (ref 150–450)
RBC: 4.73 x10E6/uL (ref 3.77–5.28)
RDW: 13.4 % (ref 11.7–15.4)
WBC: 4.6 10*3/uL (ref 3.4–10.8)

## 2021-02-08 LAB — BASIC METABOLIC PANEL
BUN/Creatinine Ratio: 13 (ref 12–28)
BUN: 15 mg/dL (ref 8–27)
CO2: 24 mmol/L (ref 20–29)
Calcium: 10.6 mg/dL — ABNORMAL HIGH (ref 8.7–10.3)
Chloride: 102 mmol/L (ref 96–106)
Creatinine, Ser: 1.14 mg/dL — ABNORMAL HIGH (ref 0.57–1.00)
Glucose: 91 mg/dL (ref 65–99)
Potassium: 4.9 mmol/L (ref 3.5–5.2)
Sodium: 141 mmol/L (ref 134–144)
eGFR: 53 mL/min/{1.73_m2} — ABNORMAL LOW (ref >59)

## 2021-02-12 ENCOUNTER — Telehealth: Payer: Self-pay | Admitting: *Deleted

## 2021-02-12 NOTE — Telephone Encounter (Signed)
Pt contacted pre-catheterization scheduled at [facility] for: Wednesday February 13, 2021 8:30 AM Verified arrival time and place: [facility]) at: 6:30 AM   No solid food after midnight prior to cath, clear liquids until 5 AM day of procedure.  Hold: Losartan-AM of procedure-GFR 53  Except hold medications AM meds can be  taken pre-cath with sips of water including: aspirin 81 mg   Confirmed patient has responsible adult to drive home post procedure and be with patient first 24 hours after arriving home: yes  You are allowed ONE visitor in the waiting room during the time you are at the hospital for your procedure. Both you and your visitor must wear a mask once you enter the hospital.   Patient reports does not currently have any symptoms concerning for COVID-19 and no household members with COVID-19 like illness.       Reviewed procedure/mask/visitor instructions with patient.

## 2021-02-13 ENCOUNTER — Ambulatory Visit (HOSPITAL_COMMUNITY)
Admission: RE | Admit: 2021-02-13 | Discharge: 2021-02-13 | Disposition: A | Discharge status: Home / Self Care | Payer: Medicare Other | Attending: Cardiovascular Disease | Admitting: Cardiovascular Disease

## 2021-02-13 ENCOUNTER — Other Ambulatory Visit: Payer: Self-pay | Admitting: *Deleted

## 2021-02-13 ENCOUNTER — Encounter (HOSPITAL_COMMUNITY)
Admission: RE | Disposition: A | Discharge status: Home / Self Care | Payer: Self-pay | Source: Home / Self Care | Attending: Cardiovascular Disease

## 2021-02-13 ENCOUNTER — Other Ambulatory Visit: Payer: Self-pay

## 2021-02-13 DIAGNOSIS — I5042 Chronic combined systolic (congestive) and diastolic (congestive) heart failure: Secondary | ICD-10-CM

## 2021-02-13 DIAGNOSIS — I350 Nonrheumatic aortic (valve) stenosis: Secondary | ICD-10-CM

## 2021-02-13 DIAGNOSIS — E785 Hyperlipidemia, unspecified: Secondary | ICD-10-CM | POA: Diagnosis not present

## 2021-02-13 DIAGNOSIS — I110 Hypertensive heart disease with heart failure: Secondary | ICD-10-CM | POA: Insufficient documentation

## 2021-02-13 DIAGNOSIS — Z79899 Other long term (current) drug therapy: Secondary | ICD-10-CM | POA: Insufficient documentation

## 2021-02-13 DIAGNOSIS — Z8249 Family history of ischemic heart disease and other diseases of the circulatory system: Secondary | ICD-10-CM | POA: Diagnosis not present

## 2021-02-13 HISTORY — PX: RIGHT/LEFT HEART CATH AND CORONARY ANGIOGRAPHY: CATH118266

## 2021-02-13 LAB — POCT I-STAT 7, (LYTES, BLD GAS, ICA,H+H)
Acid-base deficit: 1 mmol/L (ref 0.0–2.0)
Bicarbonate: 24.6 mmol/L (ref 20.0–28.0)
Calcium, Ion: 1.17 mmol/L (ref 1.15–1.40)
HCT: 35 % — ABNORMAL LOW (ref 36.0–46.0)
Hemoglobin: 11.9 g/dL — ABNORMAL LOW (ref 12.0–15.0)
O2 Saturation: 99 %
Potassium: 3.8 mmol/L (ref 3.5–5.1)
Sodium: 141 mmol/L (ref 135–145)
TCO2: 26 mmol/L (ref 22–32)
pCO2 arterial: 41.7 mmHg (ref 32.0–48.0)
pH, Arterial: 7.379 (ref 7.350–7.450)
pO2, Arterial: 140 mmHg — ABNORMAL HIGH (ref 83.0–108.0)

## 2021-02-13 LAB — POCT I-STAT EG7
Acid-Base Excess: 0 mmol/L (ref 0.0–2.0)
Bicarbonate: 25.8 mmol/L (ref 20.0–28.0)
Calcium, Ion: 1.2 mmol/L (ref 1.15–1.40)
HCT: 36 % (ref 36.0–46.0)
Hemoglobin: 12.2 g/dL (ref 12.0–15.0)
O2 Saturation: 76 %
Potassium: 3.8 mmol/L (ref 3.5–5.1)
Sodium: 141 mmol/L (ref 135–145)
TCO2: 27 mmol/L (ref 22–32)
pCO2, Ven: 45.5 mmHg (ref 44.0–60.0)
pH, Ven: 7.362 (ref 7.250–7.430)
pO2, Ven: 42 mmHg (ref 32.0–45.0)

## 2021-02-13 SURGERY — RIGHT/LEFT HEART CATH AND CORONARY ANGIOGRAPHY
Anesthesia: LOCAL

## 2021-02-13 MED ORDER — MIDAZOLAM HCL 2 MG/2ML IJ SOLN
INTRAMUSCULAR | Status: AC
  Filled 2021-02-13: qty 2

## 2021-02-13 MED ORDER — HEPARIN SODIUM (PORCINE) 1000 UNIT/ML IJ SOLN
INTRAMUSCULAR | Status: DC | PRN
  Administered 2021-02-13: 3500 [IU] via INTRAVENOUS

## 2021-02-13 MED ORDER — IOHEXOL 350 MG/ML SOLN
INTRAVENOUS | Status: DC | PRN
  Administered 2021-02-13: 30 mL

## 2021-02-13 MED ORDER — SODIUM CHLORIDE 0.9% FLUSH: 3.0000 mL | Freq: Two times a day (BID) | INTRAVENOUS | Status: DC

## 2021-02-13 MED ORDER — SODIUM CHLORIDE 0.9 % WEIGHT BASED INFUSION: 1.0000 mL/kg/h | INTRAVENOUS | Status: DC

## 2021-02-13 MED ORDER — FENTANYL CITRATE (PF) 100 MCG/2ML IJ SOLN
INTRAMUSCULAR | Status: DC | PRN
  Administered 2021-02-13: 50 ug via INTRAVENOUS

## 2021-02-13 MED ORDER — SODIUM CHLORIDE 0.9 % IV SOLN: INTRAVENOUS | Status: DC

## 2021-02-13 MED ORDER — LIDOCAINE HCL (PF) 1 % IJ SOLN
INTRAMUSCULAR | Status: AC
  Filled 2021-02-13: qty 30

## 2021-02-13 MED ORDER — VERAPAMIL HCL 2.5 MG/ML IV SOLN
INTRAVENOUS | Status: DC | PRN
  Administered 2021-02-13: 10 mL via INTRA_ARTERIAL

## 2021-02-13 MED ORDER — ASPIRIN 81 MG PO CHEW: 81.0000 mg | CHEWABLE_TABLET | ORAL | Status: DC

## 2021-02-13 MED ORDER — SODIUM CHLORIDE 0.9 % WEIGHT BASED INFUSION: 3.0000 mL/kg/h | INTRAVENOUS | Status: DC

## 2021-02-13 MED ORDER — FENTANYL CITRATE (PF) 100 MCG/2ML IJ SOLN
INTRAMUSCULAR | Status: AC
  Filled 2021-02-13: qty 2

## 2021-02-13 MED ORDER — MIDAZOLAM HCL 2 MG/2ML IJ SOLN
INTRAMUSCULAR | Status: DC | PRN
  Administered 2021-02-13: 2 mg via INTRAVENOUS

## 2021-02-13 MED ORDER — HEPARIN (PORCINE) IN NACL 1000-0.9 UT/500ML-% IV SOLN
INTRAVENOUS | Status: AC
  Filled 2021-02-13: qty 1000

## 2021-02-13 MED ORDER — SODIUM CHLORIDE 0.9 % IV SOLN: 250.0000 mL | INTRAVENOUS | Status: DC | PRN

## 2021-02-13 MED ORDER — HEPARIN (PORCINE) IN NACL 1000-0.9 UT/500ML-% IV SOLN
INTRAVENOUS | Status: DC | PRN
  Administered 2021-02-13 (×2): 500 mL

## 2021-02-13 MED ORDER — SODIUM CHLORIDE 0.9% FLUSH: 3.0000 mL | INTRAVENOUS | Status: DC | PRN

## 2021-02-13 MED ORDER — VERAPAMIL HCL 2.5 MG/ML IV SOLN
INTRAVENOUS | Status: AC
  Filled 2021-02-13: qty 2

## 2021-02-13 MED ORDER — LIDOCAINE HCL (PF) 1 % IJ SOLN
INTRAMUSCULAR | Status: DC | PRN
  Administered 2021-02-13 (×2): 2 mL

## 2021-02-13 SURGICAL SUPPLY — 12 items
CATH 5FR JL3.5 JR4 ANG PIG MP (CATHETERS) ×1 IMPLANT
CATH SWAN DBL LUMAN 5F 110 (CATHETERS) ×1 IMPLANT
DEVICE RAD COMP TR BAND LRG (VASCULAR PRODUCTS) ×1 IMPLANT
GLIDESHEATH SLEND SS 6F .021 (SHEATH) ×1 IMPLANT
GUIDEWIRE INQWIRE 1.5J.035X260 (WIRE) IMPLANT
INQWIRE 1.5J .035X260CM (WIRE) ×2
KIT HEART LEFT (KITS) ×2 IMPLANT
PACK CARDIAC CATHETERIZATION (CUSTOM PROCEDURE TRAY) ×2 IMPLANT
SHEATH GLIDE SLENDER 4/5FR (SHEATH) ×1 IMPLANT
TRANSDUCER W/STOPCOCK (MISCELLANEOUS) ×2 IMPLANT
TUBING CIL FLEX 10 FLL-RA (TUBING) ×2 IMPLANT
WIRE HI TORQ VERSACORE-J 145CM (WIRE) ×1 IMPLANT

## 2021-02-13 NOTE — Interval H&P Note (Signed)
History and Physical Interval Note:  02/13/2021 [id number] AM  [name]  has presented today for surgery, with the diagnosis of aortic stenosis.  The various methods of treatment have been discussed with the patient and family. After consideration of risks, benefits and other options for treatment, the patient has consented to  Procedure(s): RIGHT/LEFT HEART CATH AND CORONARY ANGIOGRAPHY (N/A) as a surgical intervention.  The patient's history has been reviewed, patient examined, no change in status, stable for surgery.  I have reviewed the patient's chart and labs.  Questions were answered to the patient's satisfaction.    Cath Lab Visit (complete for each Cath Lab visit)  Clinical Evaluation Leading to the Procedure:   ACS: No.  Non-ACS:    Anginal Classification: CCS II  Anti-ischemic medical therapy: Minimal Therapy (1 class of medications)  Non-Invasive Test Results: No non-invasive testing performed  Prior CABG: No previous CABG        Lauree Chandler

## 2021-02-14 ENCOUNTER — Encounter (HOSPITAL_COMMUNITY): Payer: Self-pay | Admitting: Cardiovascular Disease

## 2021-02-18 NOTE — Telephone Encounter (Signed)
Called [facility] to get CT's scheduled for patient, after confirmation with nurse. TAVR nurse will call once they have gone through patients chart to schedule patient for CT. I called patient to let her know that someone should be calling her in the next few days to get those scheduled.

## 2021-02-21 DIAGNOSIS — I350 Nonrheumatic aortic (valve) stenosis: Secondary | ICD-10-CM | POA: Diagnosis not present

## 2021-02-21 DIAGNOSIS — I5042 Chronic combined systolic (congestive) and diastolic (congestive) heart failure: Secondary | ICD-10-CM | POA: Diagnosis not present

## 2021-02-21 DIAGNOSIS — Z79899 Other long term (current) drug therapy: Secondary | ICD-10-CM | POA: Diagnosis not present

## 2021-02-22 LAB — BASIC METABOLIC PANEL
BUN/Creatinine Ratio: 13 (ref 12–28)
BUN: 15 mg/dL (ref 8–27)
CO2: 25 mmol/L (ref 20–29)
Calcium: 10.6 mg/dL — ABNORMAL HIGH (ref 8.7–10.3)
Chloride: 101 mmol/L (ref 96–106)
Creatinine, Ser: 1.15 mg/dL — ABNORMAL HIGH (ref 0.57–1.00)
Glucose: 98 mg/dL (ref 65–99)
Potassium: 4.8 mmol/L (ref 3.5–5.2)
Sodium: 138 mmol/L (ref 134–144)
eGFR: 53 mL/min/{1.73_m2} — ABNORMAL LOW (ref >59)

## 2021-02-25 ENCOUNTER — Ambulatory Visit (HOSPITAL_COMMUNITY)
Admission: RE | Admit: 2021-02-25 | Discharge: 2021-02-25 | Disposition: A | Discharge status: Home / Self Care | Payer: Medicare Other | Source: Ambulatory Visit | Attending: Cardiology | Admitting: Cardiology

## 2021-02-25 ENCOUNTER — Other Ambulatory Visit: Payer: Self-pay

## 2021-02-25 DIAGNOSIS — K449 Diaphragmatic hernia without obstruction or gangrene: Secondary | ICD-10-CM | POA: Diagnosis not present

## 2021-02-25 DIAGNOSIS — I700 Atherosclerosis of aorta: Secondary | ICD-10-CM | POA: Diagnosis not present

## 2021-02-25 DIAGNOSIS — I350 Nonrheumatic aortic (valve) stenosis: Secondary | ICD-10-CM | POA: Diagnosis not present

## 2021-02-25 DIAGNOSIS — N200 Calculus of kidney: Secondary | ICD-10-CM | POA: Diagnosis not present

## 2021-02-25 MED ORDER — NITROGLYCERIN 0.4 MG SL SUBL
SUBLINGUAL_TABLET | SUBLINGUAL | Status: AC
  Administered 2021-02-25: 0.8 mg
  Filled 2021-02-25: qty 2

## 2021-02-25 MED ORDER — IOHEXOL 350 MG/ML SOLN
100.0000 mL | Freq: Once | INTRAVENOUS | Status: AC | PRN
  Administered 2021-02-25: 100 mL via INTRAVENOUS

## 2021-02-28 ENCOUNTER — Other Ambulatory Visit (HOSPITAL_COMMUNITY): Payer: Medicare Other

## 2021-03-04 NOTE — Progress Notes (Signed)
Cardiology Office Note:    Date:  3/79/0240   ID:  Theodosia, Bahena [birth year], MRN [id number]  PCP:  Caren Macadam, MD  Cardiologist:  None  Electrophysiologist:  None   Referring MD: Caren Macadam, MD   Chief Complaint  Patient presents with   Follow-up   Aortic Stenosis      History of Present Illness:    [name] is a 65 y.o. female with a hx of breast cancer, hyperlipidemia who presents for follow-up.  She was referred by Dr. Mannie Stabile for evaluation of heart murmur, initially seen on 10/24/2019.  She reports that she followed at [facility] as a child, was told she had an issue with her aortic valve.  She had a heart catheterization done in middle school.  States that she followed with cardiology there until college, has not been seen for 40 years.  Reports that she was told that everything resolved and she did not need further cardiology follow-up.  She denies any chest pain or dyspnea.  States that she walks about twice per week, usually from 45 minutes to an hour.  Prior to pandemic was walking for 1 to 2 hours.  She denies any lightheadedness, syncope, or palpitations.  Does report occasional lower extremity edema.  No history of smoking.  Family history includes mother died of MI at age 25, and brother had CABG/AVR and died at age 63.  TTE February 17, 2007 showed LVEF 55%, mild LV dilatation, mild MR, mild AI.  TTE on 11/14/2019 showed LVEF 65 to 70%, indeterminate diastolic filling, normal RV systolic function, mild aortic stenosis, mild aortic regurgitation, mild mitral regurgitation.  Echocardiogram on 01/16/2021 showed EF 40 to 45%, mild RV dysfunction, moderate to severe aortic stenosis (V-max 3.0 m/s, mean gradient 20 mmHg, AVA 0.8 cm, DI 0.24). LHC/RHC on 02/13/21 showed no evidence of CAD, moderate to severe aortic stenosis (mean gradient 27 mmHg, AVA 1.1 cm), RA 1, RV 19/0, PA 20/8/12, PW CP 8, LVEDP 8, CI 3.2.  CTA shows tricuspid aortic valve with partial fusion of  RCC/LCC, possibly a forme fruste bicuspid aortic valve.  Since last clinic visit, she reports that she has been doing well.  Denies any chest pain, dyspnea, lightheadedness, syncope, lower extremity edema, or palpitations.  She is doing water aerobics 4-5 days per week, denies any symptoms.   Past Medical History:  Diagnosis Date   Breast cancer [facility]) 2004   right breast; ER/PR+, Her2+   Cancer ([place]) April 2004   right breast lumpectomy   GERD (gastroesophageal reflux disease)    Hyperlipidemia    Personal history of chemotherapy 11/19/2002   Personal history of radiation therapy 02/19/2003    Past Surgical History:  Procedure Laterality Date   BREAST BIOPSY Right 2019   BREAST LUMPECTOMY Right    BREAST SURGERY  10/2002   lumpectomy / right side   RIGHT/LEFT HEART CATH AND CORONARY ANGIOGRAPHY N/A 02/13/2021   Procedure: RIGHT/LEFT HEART CATH AND CORONARY ANGIOGRAPHY;  Surgeon: Burnell Blanks, MD;  Location: [facility];  Service: Cardiovascular;  Laterality: N/A;    Current Medications: Current Meds  Medication Sig   Calcium Carbonate (CALCI-CHEW PO) Take 1 tablet by mouth in the morning.   escitalopram (LEXAPRO) 5 MG tablet Take 5 mg by mouth in the morning.   losartan (COZAAR) 25 MG tablet Take 1 tablet (25 mg total) by mouth daily.   [DISCONTINUED] carvedilol (COREG) 3.125 MG tablet Take 1 tablet (3.125 mg total) by  mouth 2 (two) times daily.     Allergies:   Patient has no known allergies.   Social History   Socioeconomic History   Marital status: Divorced    Spouse name: Not on file   Number of children: Not on file   Years of education: Not on file   Highest education level: Not on file  Occupational History   Not on file  Tobacco Use   Smoking status: Never   Smokeless tobacco: Never  Substance and Sexual Activity   Alcohol use: Yes    Alcohol/week: 1.0 - 2.0 standard drink    Types: 1 - 2 Glasses of wine per week    Comment: occasional    Drug use: No   Sexual activity: Not on file  Other Topics Concern   Not on file  Social History Narrative   Not on file   Social Determinants of Health   Financial Resource Strain: Not on file  Food Insecurity: Not on file  Transportation Needs: Not on file  Physical Activity: Not on file  Stress: Not on file  Social Connections: Not on file     Family History: The patient's family history includes Alzheimer's disease in her paternal aunt and paternal uncle; Bladder Cancer in her brother; Diabetes in her brother, maternal aunt, maternal uncle, and mother; Heart Problems in her maternal uncle; Heart attack in her maternal grandfather, paternal grandfather, and paternal grandmother; Heart disease in her brother, maternal aunt, mother, and paternal aunt; Kidney failure in her brother and mother; Lung cancer (age of onset: 93) in her father; Skin cancer in her father and paternal uncle. There is no history of Breast cancer.  ROS:   Please see the history of present illness. (+)   chest pressure  All other systems reviewed and are negative.  EKGs/Labs/Other Studies Reviewed:    The following studies were reviewed today:   EKG:   07/22:  NSR, rate 69, LVH 11/27/2020 (PCP Office): NSR, rate 69 bpm, LBBB 11/29/2019: EKG was not ordered.   10/24/2019: normal sinus rhythm, rate 73, Q waves in V1/2, no ST/T abnormalities  TTE 11/14/19:  1. Left ventricular ejection fraction, by estimation, is 65 to 70%. The  left ventricle has normal function. The left ventricle has no regional  wall motion abnormalities. Indeterminate diastolic filling due to E-A  fusion.   2. Right ventricular systolic function is normal. The right ventricular  size is normal. There is normal pulmonary artery systolic pressure.   3. The mitral valve is normal in structure. Mild mitral valve  regurgitation.   4. AV is thickened, calcified Peak and mean gradients through the valve  are 29 and 17 mm Hg respectively  consistent with mild aortic stenosis..  The aortic valve is tricuspid. Aortic valve regurgitation is mild. Mild  aortic valve stenosis.   5. The inferior vena cava is normal in size with greater than 50%  respiratory variability, suggesting right atrial pressure of 3 mmHg.   Recent Labs: 02/07/2021: Platelets 301 02/13/2021: Hemoglobin 12.2 02/21/2021: BUN 15; Creatinine, Ser 1.15; Potassium 4.8; Sodium 138  Recent Lipid Panel No results found for: CHOL, TRIG, HDL, CHOLHDL, VLDL, LDLCALC, LDLDIRECT  Physical Exam:    VS:  BP 112/72 (BP Location: Left Arm, Patient Position: Sitting, Cuff Size: Normal)   Pulse 67   Ht $R'5\' 3"'FE$  (1.6 m)   Wt 147 lb (66.7 kg)   BMI 26.04 kg/m     Wt Readings from Last 3 Encounters:  03/05/21  147 lb (66.7 kg)  02/13/21 150 lb (68 kg)  02/07/21 152 lb 12.8 oz (69.3 kg)     GEN:  Well nourished, well developed in no acute distress HEENT: Normal NECK: No JVD CARDIAC: RRR,  2/6 systolic heart murmur loudest at the RUSB RESPIRATORY:  Clear to auscultation without rales, wheezing or rhonchi  ABDOMEN: Soft, non-tender, non-distended MUSCULOSKELETAL:  No edema; No deformity  SKIN: Warm and dry NEUROLOGIC:  Alert and oriented x 3 PSYCHIATRIC:  Normal affect   ASSESSMENT:    1. Chronic combined systolic and diastolic heart failure ([place])   2. Aortic valve stenosis, etiology of cardiac valve disease unspecified   3. Hyperlipidemia, unspecified hyperlipidemia type   4. Essential hypertension      PLAN:    Chronic combined systolic and diastolic heart failure:  new diagnosis.  Echocardiogram on 01/16/2021 showed EF 40 to 45%, mild RV dysfunction, moderate to severe aortic stenosis (V-max 3.0 m/s, mean gradient 20 mmHg, AVA 0.8 cm, DI 0.24).  LHC/RHC on 02/13/21 showed no evidence of CAD, moderate to severe aortic stenosis (mean gradient 27 mmHg, AVA 1.1 cm), RA 1, RV 19/0, PA 20/8/12, PW CP 8, LVEDP 8, CI 3.2.  CTA shows tricuspid aortic valve with partial  fusion of RCC/LCC, possibly a forme fruste bicuspid aortic valve. -Continue Coreg, will increase to 6.25 mg twice daily -Continue losartan 25 mg -Discussed cardiac MRI for work-up nonischemic cardiomyopathy, but patient reports he has severe claustrophobia and is unable to tolerate MRI  Moderate to severe aortic stenosis: Echocardiogram on 01/16/2021 showed EF 40 to 45%, mild RV dysfunction, moderate to severe aortic stenosis (V-max 3.0 m/s, mean gradient 20 mmHg, AVA 0.8 cm, DI 0.24).  AV calcium score only 365 on 01/16/21, argues against severe AS.  LHC/RHC on 02/13/21 showed no evidence of CAD, moderate to severe aortic stenosis (mean gradient 27 mmHg, AVA 1.1 cm), RA 1, RV 19/0, PA 20/8/12, PW CP 8, LVEDP 8, CI 3.2.  CTA shows tricuspid aortic valve with partial fusion of RCC/LCC, possibly a forme fruste bicuspid aortic valve. -Suspect AS is more moderate, and she is asymptomatic, will monitor  Hyperlipidemia: LDL 145 on 11/27/20.  10 year ASCVD risk score 7%, borderline for meeting indication to start statin.  Caclium score 0 on 01/16/21  Hypertension: Continue carvedilol and losartan as above  RTC in 3 months   Medication Adjustments/Labs and Tests Ordered: Current medicines are reviewed at length with the patient today.  Concerns regarding medicines are outlined above.  No orders of the defined types were placed in this encounter.   Meds ordered this encounter  Medications   carvedilol (COREG) 6.25 MG tablet    Sig: Take 1 tablet (6.25 mg total) by mouth 2 (two) times daily.    Dispense:  180 tablet    Refill:  3    Dose increase      Patient Instructions  Medication Instructions:  INCREASE carvedilol (Coreg) to 6.25 mg two times daily  *If you need a refill on your cardiac medications before your next appointment, please call your pharmacy*  Follow-Up: At [facility], you and your health needs are our priority.  As part of our continuing mission to provide you with  exceptional heart care, we have created designated Provider Care Teams.  These Care Teams include your primary Cardiologist (physician) and Advanced Practice Providers (APPs -  Physician Assistants and Nurse Practitioners) who all work together to provide you with the care you need, when you need  it.  We recommend signing up for the patient portal called "MyChart".  Sign up information is provided on this After Visit Summary.  MyChart is used to connect with patients for Virtual Visits (Telemedicine).  Patients are able to view lab/test results, encounter notes, upcoming appointments, etc.  Non-urgent messages can be sent to your provider as well.   To learn more about what you can do with MyChart, go to [email or web address].    Your next appointment:   3 month(s)  The format for your next appointment:   In Person  Provider:   Oswaldo Milian, MD      Signed, Donato Heinz, MD  03/05/2021 2:04 PM    [place]

## 2021-03-05 ENCOUNTER — Other Ambulatory Visit: Payer: Self-pay

## 2021-03-05 ENCOUNTER — Encounter: Payer: Self-pay | Admitting: Cardiology

## 2021-03-05 ENCOUNTER — Ambulatory Visit: Payer: Medicare Other | Admitting: Cardiology

## 2021-03-05 VITALS — BP 112/72 | HR 67 | Ht 63.0 in | Wt 147.0 lb

## 2021-03-05 DIAGNOSIS — E785 Hyperlipidemia, unspecified: Secondary | ICD-10-CM

## 2021-03-05 DIAGNOSIS — I5042 Chronic combined systolic (congestive) and diastolic (congestive) heart failure: Secondary | ICD-10-CM | POA: Diagnosis not present

## 2021-03-05 DIAGNOSIS — I10 Essential (primary) hypertension: Secondary | ICD-10-CM

## 2021-03-05 DIAGNOSIS — I350 Nonrheumatic aortic (valve) stenosis: Secondary | ICD-10-CM | POA: Diagnosis not present

## 2021-03-05 MED ORDER — CARVEDILOL 6.25 MG PO TABS: 6.2500 mg | ORAL_TABLET | Freq: Two times a day (BID) | ORAL | 3 refills | Status: DC

## 2021-03-05 NOTE — Patient Instructions (Signed)
Medication Instructions:  INCREASE carvedilol (Coreg) to 6.25 mg two times daily  *If you need a refill on your cardiac medications before your next appointment, please call your pharmacy*  Follow-Up: At [facility], you and your health needs are our priority.  As part of our continuing mission to provide you with exceptional heart care, we have created designated Provider Care Teams.  These Care Teams include your primary Cardiologist (physician) and Advanced Practice Providers (APPs -  Physician Assistants and Nurse Practitioners) who all work together to provide you with the care you need, when you need it.  We recommend signing up for the patient portal called "MyChart".  Sign up information is provided on this After Visit Summary.  MyChart is used to connect with patients for Virtual Visits (Telemedicine).  Patients are able to view lab/test results, encounter notes, upcoming appointments, etc.  Non-urgent messages can be sent to your provider as well.   To learn more about what you can do with MyChart, go to [email or web address].    Your next appointment:   3 month(s)  The format for your next appointment:   In Person  Provider:   Oswaldo Milian, MD

## 2021-04-08 ENCOUNTER — Ambulatory Visit
Admission: RE | Admit: 2021-04-08 | Discharge: 2021-04-08 | Disposition: A | Discharge status: Home / Self Care | Payer: Medicare Other | Source: Ambulatory Visit | Attending: Family Medicine | Admitting: Family Medicine

## 2021-04-08 ENCOUNTER — Other Ambulatory Visit: Payer: Self-pay

## 2021-04-08 DIAGNOSIS — Z1231 Encounter for screening mammogram for malignant neoplasm of breast: Secondary | ICD-10-CM

## 2021-06-04 DIAGNOSIS — E7800 Pure hypercholesterolemia, unspecified: Secondary | ICD-10-CM | POA: Diagnosis not present

## 2021-06-04 DIAGNOSIS — R7303 Prediabetes: Secondary | ICD-10-CM | POA: Diagnosis not present

## 2021-06-04 DIAGNOSIS — F321 Major depressive disorder, single episode, moderate: Secondary | ICD-10-CM | POA: Diagnosis not present

## 2021-06-04 DIAGNOSIS — I350 Nonrheumatic aortic (valve) stenosis: Secondary | ICD-10-CM | POA: Diagnosis not present

## 2021-06-09 NOTE — Progress Notes (Signed)
Cardiology Office Note:    Date:  32/20/2542   ID:  Tasha, Cox [birth year], MRN [id number]  PCP:  Caren Macadam, MD  Cardiologist:  None  Electrophysiologist:  None   Referring MD: Caren Macadam, MD   Chief Complaint  Patient presents with   Aortic Stenosis     History of Present Illness:    [name] is a 65 y.o. female with a hx of breast cancer, hyperlipidemia who presents for follow-up.  She was referred by Dr. Mannie Stabile for evaluation of heart murmur, initially seen on 10/24/2019.  She reports that she followed at [facility] as a child, was told she had an issue with her aortic valve.  She had a heart catheterization done in middle school.  States that she followed with cardiology there until college, has not been seen for 40 years.  Reports that she was told that everything resolved and she did not need further cardiology follow-up.  She denies any chest pain or dyspnea.  States that she walks about twice per week, usually from 45 minutes to an hour.  Prior to pandemic was walking for 1 to 2 hours.  She denies any lightheadedness, syncope, or palpitations.  Does report occasional lower extremity edema.  No history of smoking.  Family history includes mother died of MI at age 79, and brother had CABG/AVR and died at age 2.  TTE 02/16/2007 showed LVEF 55%, mild LV dilatation, mild MR, mild AI.  TTE on 11/14/2019 showed LVEF 65 to 70%, indeterminate diastolic filling, normal RV systolic function, mild aortic stenosis, mild aortic regurgitation, mild mitral regurgitation.  Echocardiogram on 01/16/2021 showed EF 40 to 45%, mild RV dysfunction, moderate to severe aortic stenosis (V-max 3.0 m/s, mean gradient 20 mmHg, AVA 0.8 cm, DI 0.24). LHC/RHC on 02/13/21 showed no evidence of CAD, moderate to severe aortic stenosis (mean gradient 27 mmHg, AVA 1.1 cm), RA 1, RV 19/0, PA 20/8/12, PW CP 8, LVEDP 8, CI 3.2.  CTA shows tricuspid aortic valve with partial fusion of RCC/LCC,  possibly a forme fruste bicuspid aortic valve.  Since last clinic visit, she reports that she is doing okay.  States that since her carvedilol was increased to 6.25 mg twice daily she has felt very fatigued.  States that she feels tired all the time.  Denies any chest pain, dyspnea, lightheadedness, syncope, lower extremity edema, or palpitations.  Continues to do water aerobics 4-5 days per week for 1 hour, no symptoms    Past Medical History:  Diagnosis Date   Breast cancer ([place]) 2004   right breast; ER/PR+, Her2+   Cancer ([place]) April 2004   right breast lumpectomy   GERD (gastroesophageal reflux disease)    Hyperlipidemia    Personal history of chemotherapy 11/19/2002   Personal history of radiation therapy 02/19/2003    Past Surgical History:  Procedure Laterality Date   BREAST BIOPSY Right 2019   BREAST LUMPECTOMY Right    BREAST SURGERY  10/2002   lumpectomy / right side   RIGHT/LEFT HEART CATH AND CORONARY ANGIOGRAPHY N/A 02/13/2021   Procedure: RIGHT/LEFT HEART CATH AND CORONARY ANGIOGRAPHY;  Surgeon: Burnell Blanks, MD;  Location: [place] [facility];  Service: Cardiovascular;  Laterality: N/A;    Current Medications: Current Meds  Medication Sig   Calcium Carbonate (CALCI-CHEW PO) Take 1 tablet by mouth in the morning.   escitalopram (LEXAPRO) 5 MG tablet Take 5 mg by mouth in the morning.   [DISCONTINUED] carvedilol (COREG) 6.25  MG tablet Take 1 tablet (6.25 mg total) by mouth 2 (two) times daily.     Allergies:   Patient has no known allergies.   Social History   Socioeconomic History   Marital status: Divorced    Spouse name: Not on file   Number of children: Not on file   Years of education: Not on file   Highest education level: Not on file  Occupational History   Not on file  Tobacco Use   Smoking status: Never   Smokeless tobacco: Never  Substance and Sexual Activity   Alcohol use: Yes    Alcohol/week: 1.0 - 2.0 standard drink    Types: 1  - 2 Glasses of wine per week    Comment: occasional   Drug use: No   Sexual activity: Not on file  Other Topics Concern   Not on file  Social History Narrative   Not on file   Social Determinants of Health   Financial Resource Strain: Not on file  Food Insecurity: Not on file  Transportation Needs: Not on file  Physical Activity: Not on file  Stress: Not on file  Social Connections: Not on file     Family History: The patient's family history includes Alzheimer's disease in her paternal aunt and paternal uncle; Bladder Cancer in her brother; Diabetes in her brother, maternal aunt, maternal uncle, and mother; Heart Problems in her maternal uncle; Heart attack in her maternal grandfather, paternal grandfather, and paternal grandmother; Heart disease in her brother, maternal aunt, mother, and paternal aunt; Kidney failure in her brother and mother; Lung cancer (age of onset: 79) in her father; Skin cancer in her father and paternal uncle. There is no history of Breast cancer.  ROS:   Please see the history of present illness.  All other systems reviewed and are negative.  EKGs/Labs/Other Studies Reviewed:    The following studies were reviewed today:   EKG:   07/22:  NSR, rate 69, LVH 11/27/2020 (PCP Office): NSR, rate 69 bpm, LBBB 11/29/2019: EKG was not ordered.   10/24/2019: normal sinus rhythm, rate 73, Q waves in V1/2, no ST/T abnormalities  TTE 11/14/19:  1. Left ventricular ejection fraction, by estimation, is 65 to 70%. The  left ventricle has normal function. The left ventricle has no regional  wall motion abnormalities. Indeterminate diastolic filling due to E-A  fusion.   2. Right ventricular systolic function is normal. The right ventricular  size is normal. There is normal pulmonary artery systolic pressure.   3. The mitral valve is normal in structure. Mild mitral valve  regurgitation.   4. AV is thickened, calcified Peak and mean gradients through the valve  are  29 and 17 mm Hg respectively consistent with mild aortic stenosis..  The aortic valve is tricuspid. Aortic valve regurgitation is mild. Mild  aortic valve stenosis.   5. The inferior vena cava is normal in size with greater than 50%  respiratory variability, suggesting right atrial pressure of 3 mmHg.   Recent Labs: 02/07/2021: Platelets 301 02/13/2021: Hemoglobin 12.2 02/21/2021: BUN 15; Creatinine, Ser 1.15; Potassium 4.8; Sodium 138  Recent Lipid Panel No results found for: CHOL, TRIG, HDL, CHOLHDL, VLDL, LDLCALC, LDLDIRECT  Physical Exam:    VS:  BP 118/70   Pulse 100   Ht 5' 3"  (1.6 m)   Wt 156 lb 6.4 oz (70.9 kg)   SpO2 99%   BMI 27.71 kg/m     Wt Readings from Last 3 Encounters:  06/11/21  156 lb 6.4 oz (70.9 kg)  03/05/21 147 lb (66.7 kg)  02/13/21 150 lb (68 kg)     GEN:  Well nourished, well developed in no acute distress HEENT: Normal NECK: No JVD CARDIAC: RRR,  2/6 systolic heart murmur loudest at the RUSB RESPIRATORY:  Clear to auscultation without rales, wheezing or rhonchi  ABDOMEN: Soft, non-tender, non-distended MUSCULOSKELETAL:  No edema; No deformity  SKIN: Warm and dry NEUROLOGIC:  Alert and oriented x 3 PSYCHIATRIC:  Normal affect   ASSESSMENT:    1. Chronic combined systolic and diastolic heart failure ([place])   2. Aortic valve stenosis, etiology of cardiac valve disease unspecified   3. Hyperlipidemia, unspecified hyperlipidemia type   4. Essential hypertension     PLAN:    Chronic combined systolic and diastolic heart failure:  new diagnosis.  Echocardiogram on 01/16/2021 showed EF 40 to 45%, mild RV dysfunction, moderate to severe aortic stenosis (V-max 3.0 m/s, mean gradient 20 mmHg, AVA 0.8 cm, DI 0.24).  LHC/RHC on 02/13/21 showed no evidence of CAD, moderate to severe aortic stenosis (mean gradient 27 mmHg, AVA 1.1 cm), RA 1, RV 19/0, PA 20/8/12, PW CP 8, LVEDP 8, CI 3.2.  CTA shows tricuspid aortic valve with partial fusion of RCC/LCC, possibly  a forme fruste bicuspid aortic valve. -Continue Coreg, has felt significant fatigue since increasing dose to 6.25 mg twice daily.  We will decrease dose to 3.125 mg twice daily -Continue losartan 25 mg -Discussed cardiac MRI for work-up nonischemic cardiomyopathy, but patient reports she has severe claustrophobia and is unable to tolerate MRI  Moderate to severe aortic stenosis: Echocardiogram on 01/16/2021 showed EF 40 to 45%, mild RV dysfunction, moderate to severe aortic stenosis (V-max 3.0 m/s, mean gradient 20 mmHg, AVA 0.8 cm, DI 0.24).  AV calcium score only 365 on 01/16/21, argues against severe AS.  LHC/RHC on 02/13/21 showed no evidence of CAD, moderate to severe aortic stenosis (mean gradient 27 mmHg, AVA 1.1 cm), RA 1, RV 19/0, PA 20/8/12, PW CP 8, LVEDP 8, CI 3.2.  CTA shows tricuspid aortic valve with partial fusion of RCC/LCC, possibly a forme fruste bicuspid aortic valve.  Aortic valve calcium score is 388 -Suspect AS is more moderate, and she is asymptomatic, will monitor.  Given significant change in degree of stenosis from echo last year to this year, we will plan close follow-up of aortic stenosis with repeat echo in 6 months (December 2022)  Hyperlipidemia: LDL 145 on 11/27/20.  10 year ASCVD risk score 7%, borderline for meeting indication to start statin.  Caclium score 0 on 01/16/21  Hypertension: Continue carvedilol and losartan as above   RTC in 2 months following echo    Medication Adjustments/Labs and Tests Ordered: Current medicines are reviewed at length with the patient today.  Concerns regarding medicines are outlined above.  Orders Placed This Encounter  Procedures   ECHOCARDIOGRAM COMPLETE     Meds ordered this encounter  Medications   carvedilol (COREG) 3.125 MG tablet    Sig: Take 1 tablet (3.125 mg total) by mouth 2 (two) times daily.    Dispense:  180 tablet    Refill:  3    NEW DOSE, D/C 6.25 MG RX       Patient Instructions  Medication  Instructions:  DECREASE YOUR CARVEDILOL TO 3.125 MG TWICE A DAY   *If you need a refill on your cardiac medications before your next appointment, please call your pharmacy*  Lab Work: NONE   Testing/Procedures:  Your physician has requested that you have an echocardiogram. Echocardiography is a painless test that uses sound waves to create images of your heart. It provides your doctor with information about the size and shape of your heart and how well your heart's chambers and valves are working. This procedure takes approximately one hour. There are no restrictions for this procedure.  TO BE DONE END OF December FIRST OF January PRIOR TO FOLLOW UP   Follow-Up: At [facility], you and your health needs are our priority.  As part of our continuing mission to provide you with exceptional heart care, we have created designated Provider Care Teams.  These Care Teams include your primary Cardiologist (physician) and Advanced Practice Providers (APPs -  Physician Assistants and Nurse Practitioners) who all work together to provide you with the care you need, when you need it.  We recommend signing up for the patient portal called "MyChart".  Sign up information is provided on this After Visit Summary.  MyChart is used to connect with patients for Virtual Visits (Telemedicine).  Patients are able to view lab/test results, encounter notes, upcoming appointments, etc.  Non-urgent messages can be sent to your provider as well.   To learn more about what you can do with MyChart, go to [email or web address].    Your next appointment:   08/06/2021 AT 8:40 AM WITH DR Gardiner Rhyme      SignedDonato Heinz, MD  06/11/2021 5:17 PM    [place]

## 2021-06-11 ENCOUNTER — Other Ambulatory Visit: Payer: Self-pay

## 2021-06-11 ENCOUNTER — Ambulatory Visit: Payer: Medicare Other | Admitting: Cardiology

## 2021-06-11 VITALS — BP 118/70 | HR 100 | Ht 63.0 in | Wt 156.4 lb

## 2021-06-11 DIAGNOSIS — E785 Hyperlipidemia, unspecified: Secondary | ICD-10-CM

## 2021-06-11 DIAGNOSIS — I5042 Chronic combined systolic (congestive) and diastolic (congestive) heart failure: Secondary | ICD-10-CM

## 2021-06-11 DIAGNOSIS — I350 Nonrheumatic aortic (valve) stenosis: Secondary | ICD-10-CM

## 2021-06-11 DIAGNOSIS — I10 Essential (primary) hypertension: Secondary | ICD-10-CM

## 2021-06-11 MED ORDER — CARVEDILOL 3.125 MG PO TABS: 3.1250 mg | ORAL_TABLET | Freq: Two times a day (BID) | ORAL | 3 refills | Status: DC

## 2021-06-11 NOTE — Patient Instructions (Addendum)
Medication Instructions:  DECREASE YOUR CARVEDILOL TO 3.125 MG TWICE A DAY   *If you need a refill on your cardiac medications before your next appointment, please call your pharmacy*  Lab Work: NONE   Testing/Procedures: Your physician has requested that you have an echocardiogram. Echocardiography is a painless test that uses sound waves to create images of your heart. It provides your doctor with information about the size and shape of your heart and how well your heart's chambers and valves are working. This procedure takes approximately one hour. There are no restrictions for this procedure.  TO BE DONE END OF December FIRST OF January PRIOR TO FOLLOW UP   Follow-Up: At [facility], you and your health needs are our priority.  As part of our continuing mission to provide you with exceptional heart care, we have created designated Provider Care Teams.  These Care Teams include your primary Cardiologist (physician) and Advanced Practice Providers (APPs -  Physician Assistants and Nurse Practitioners) who all work together to provide you with the care you need, when you need it.  We recommend signing up for the patient portal called "MyChart".  Sign up information is provided on this After Visit Summary.  MyChart is used to connect with patients for Virtual Visits (Telemedicine).  Patients are able to view lab/test results, encounter notes, upcoming appointments, etc.  Non-urgent messages can be sent to your provider as well.   To learn more about what you can do with MyChart, go to [email or web address].    Your next appointment:   08/06/2021 AT 8:40 AM WITH DR [facility]

## 2021-07-16 ENCOUNTER — Other Ambulatory Visit: Payer: Self-pay

## 2021-07-16 ENCOUNTER — Ambulatory Visit (HOSPITAL_COMMUNITY): Payer: Medicare Other | Attending: Cardiovascular Disease

## 2021-07-16 DIAGNOSIS — I350 Nonrheumatic aortic (valve) stenosis: Secondary | ICD-10-CM | POA: Insufficient documentation

## 2021-07-16 LAB — ECHOCARDIOGRAM COMPLETE
AR max vel: 1.3 cm2
AV Area VTI: 1.27 cm2
AV Area mean vel: 1.27 cm2
AV Mean grad: 17.8 mmHg
AV Peak grad: 31.8 mmHg
Ao pk vel: 2.82 m/s
Area-P 1/2: 4.07 cm2
P 1/2 time: 613 msec
S' Lateral: 1.35 cm

## 2021-08-04 NOTE — Progress Notes (Signed)
Cardiology Office Note:    Date:  03/07/5630   ID:  Cox, Tasha [birth year], MRN [id number]  PCP:  Caren Macadam, MD  Cardiologist:  Donato Heinz, MD  Electrophysiologist:  None   Referring MD: Caren Macadam, MD   Chief Complaint  Patient presents with   Congestive Heart Failure     History of Present Illness:    [name] is a 66 y.o. female with a hx of breast cancer, hyperlipidemia who presents for follow-up.  She was referred by Dr. Mannie Stabile for evaluation of heart murmur, initially seen on 10/24/2019.  She reports that she followed at [facility] ([place] as a child, was told she had an issue with her aortic valve.  She had a heart catheterization done in middle school.  States that she followed with cardiology there until college, has not been seen for 40 years.  Reports that she was told that everything resolved and she did not need further cardiology follow-up.  She denies any chest pain or dyspnea.  States that she walks about twice per week, usually from 45 minutes to an hour.  Prior to pandemic was walking for 1 to 2 hours.  She denies any lightheadedness, syncope, or palpitations.  Does report occasional lower extremity edema.  No history of smoking.  Family history includes mother died of MI at age 57, and brother had CABG/AVR and died at age 57.  TTE Feb 13, 2007 showed LVEF 55%, mild LV dilatation, mild MR, mild AI.  TTE on 11/14/2019 showed LVEF 65 to 70%, indeterminate diastolic filling, normal RV systolic function, mild aortic stenosis, mild aortic regurgitation, mild mitral regurgitation.  Echocardiogram on 01/16/2021 showed EF 40 to 45%, mild RV dysfunction, moderate to severe aortic stenosis (V-max 3.0 m/s, mean gradient 20 mmHg, AVA 0.8 cm, DI 0.24). LHC/RHC on 02/13/21 showed no evidence of CAD, moderate to severe aortic stenosis (mean gradient 27 mmHg, AVA 1.1 cm), RA 1, RV 19/0, PA 20/8/12, PW CP 8, LVEDP 8, CI 3.2.  CTA shows tricuspid aortic valve with  partial fusion of RCC/LCC, possibly a forme fruste bicuspid aortic valve.  Echocardiogram on 07/16/2021 showed EF had improved to 65 to 70% and aortic stenosis was mild to moderate (V-max 2.8 m/s, mean gradient 18 mmHg, AVA 1.3 cm).  Since last clinic visit, she reports that she is doing well.  Denies any chest pain, dyspnea, lightheadedness, syncope, lower extremity edema, or palpitations.  She is doing water aerobics 4-5 times per week for 1 hour, denies any symptoms.     Past Medical History:  Diagnosis Date   Breast cancer [facility]) 2004   right breast; ER/PR+, Her2+   Cancer ([place]) April 2004   right breast lumpectomy   GERD (gastroesophageal reflux disease)    Hyperlipidemia    Personal history of chemotherapy 11/19/2002   Personal history of radiation therapy 02/19/2003    Past Surgical History:  Procedure Laterality Date   BREAST BIOPSY Right 2019   BREAST LUMPECTOMY Right    BREAST SURGERY  10/2002   lumpectomy / right side   RIGHT/LEFT HEART CATH AND CORONARY ANGIOGRAPHY N/A 02/13/2021   Procedure: RIGHT/LEFT HEART CATH AND CORONARY ANGIOGRAPHY;  Surgeon: Burnell Blanks, MD;  Location: [place] [facility];  Service: Cardiovascular;  Laterality: N/A;    Current Medications: Current Meds  Medication Sig   Calcium Carbonate (CALCI-CHEW PO) Take 1 tablet by mouth in the morning.   carvedilol (COREG) 3.125 MG tablet Take 1 tablet (3.125 mg total)  by mouth 2 (two) times daily.   escitalopram (LEXAPRO) 5 MG tablet Take 5 mg by mouth in the morning.     Allergies:   Patient has no known allergies.   Social History   Socioeconomic History   Marital status: Divorced    Spouse name: Not on file   Number of children: Not on file   Years of education: Not on file   Highest education level: Not on file  Occupational History   Not on file  Tobacco Use   Smoking status: Never   Smokeless tobacco: Never  Substance and Sexual Activity   Alcohol use: Yes    Alcohol/week:  1.0 - 2.0 standard drink    Types: 1 - 2 Glasses of wine per week    Comment: occasional   Drug use: No   Sexual activity: Not on file  Other Topics Concern   Not on file  Social History Narrative   Not on file   Social Determinants of Health   Financial Resource Strain: Not on file  Food Insecurity: Not on file  Transportation Needs: Not on file  Physical Activity: Not on file  Stress: Not on file  Social Connections: Not on file     Family History: The patient's family history includes Alzheimer's disease in her paternal aunt and paternal uncle; Bladder Cancer in her brother; Diabetes in her brother, maternal aunt, maternal uncle, and mother; Heart Problems in her maternal uncle; Heart attack in her maternal grandfather, paternal grandfather, and paternal grandmother; Heart disease in her brother, maternal aunt, mother, and paternal aunt; Kidney failure in her brother and mother; Lung cancer (age of onset: 31) in her father; Skin cancer in her father and paternal uncle. There is no history of Breast cancer.  ROS:   Please see the history of present illness.  All other systems reviewed and are negative.  EKGs/Labs/Other Studies Reviewed:    The following studies were reviewed today:   EKG:   08/06/21: Normal sinus rhythm, rate 70, LVH, left axis deviation 07/22:  NSR, rate 69, LVH 11/27/2020 (PCP Office): NSR, rate 69 bpm, LBBB 11/29/2019: EKG was not ordered.   10/24/2019: normal sinus rhythm, rate 73, Q waves in V1/2, no ST/T abnormalities  TTE 11/14/19:  1. Left ventricular ejection fraction, by estimation, is 65 to 70%. The  left ventricle has normal function. The left ventricle has no regional  wall motion abnormalities. Indeterminate diastolic filling due to E-A  fusion.   2. Right ventricular systolic function is normal. The right ventricular  size is normal. There is normal pulmonary artery systolic pressure.   3. The mitral valve is normal in structure. Mild mitral  valve  regurgitation.   4. AV is thickened, calcified Peak and mean gradients through the valve  are 29 and 17 mm Hg respectively consistent with mild aortic stenosis..  The aortic valve is tricuspid. Aortic valve regurgitation is mild. Mild  aortic valve stenosis.   5. The inferior vena cava is normal in size with greater than 50%  respiratory variability, suggesting right atrial pressure of 3 mmHg.   Recent Labs: 02/07/2021: Platelets 301 02/13/2021: Hemoglobin 12.2 02/21/2021: BUN 15; Creatinine, Ser 1.15; Potassium 4.8; Sodium 138  Recent Lipid Panel No results found for: CHOL, TRIG, HDL, CHOLHDL, VLDL, LDLCALC, LDLDIRECT  Physical Exam:    VS:  BP 126/80 (BP Location: Left Arm)    Pulse 70    Ht 5' 3.5" (1.613 m)    Wt 156 lb 9.6  oz (71 kg)    BMI 27.31 kg/m     Wt Readings from Last 3 Encounters:  08/06/21 156 lb 9.6 oz (71 kg)  06/11/21 156 lb 6.4 oz (70.9 kg)  03/05/21 147 lb (66.7 kg)     GEN:  Well nourished, well developed in no acute distress HEENT: Normal NECK: No JVD CARDIAC: RRR,  2/6 systolic heart murmur loudest at the RUSB RESPIRATORY:  Clear to auscultation without rales, wheezing or rhonchi  ABDOMEN: Soft, non-tender, non-distended MUSCULOSKELETAL:  No edema; No deformity  SKIN: Warm and dry NEUROLOGIC:  Alert and oriented x 3 PSYCHIATRIC:  Normal affect   ASSESSMENT:    1. Chronic combined systolic and diastolic heart failure ([place])   2. Aortic valve stenosis, etiology of cardiac valve disease unspecified   3. Essential hypertension   4. Hyperlipidemia, unspecified hyperlipidemia type      PLAN:    Chronic combined systolic and diastolic heart failure:  new diagnosis.  Echocardiogram on 01/16/2021 showed EF 40 to 45%, mild RV dysfunction, moderate to severe aortic stenosis (V-max 3.0 m/s, mean gradient 20 mmHg, AVA 0.8 cm, DI 0.24).  LHC/RHC on 02/13/21 showed no evidence of CAD, moderate to severe aortic stenosis (mean gradient 27 mmHg, AVA 1.1 cm),  RA 1, RV 19/0, PA 20/8/12, PW CP 8, LVEDP 8, CI 3.2.  CTA shows tricuspid aortic valve with partial fusion of RCC/LCC, possibly a forme fruste bicuspid aortic valve.  Echocardiogram on 07/16/2021 showed EF had improved to 65 to 70% and aortic stenosis was mild to moderate (V-max 2.8 m/s, mean gradient 18 mmHg, AVA 1.3 cm). -Continue Coreg 3.125 mg twice daily -Continue losartan 25 mg -Discussed cardiac MRI for work-up nonischemic cardiomyopathy, but patient reports she has severe claustrophobia and is unable to tolerate MRI.  Will hold off given improvement in systolic function  Aortic stenosis: Echocardiogram on 01/16/2021 showed EF 40 to 45%, mild RV dysfunction, moderate to severe aortic stenosis (V-max 3.0 m/s, mean gradient 20 mmHg, AVA 0.8 cm, DI 0.24).  AV calcium score only 365 on 01/16/21, argues against severe AS.  LHC/RHC on 02/13/21 showed no evidence of CAD, moderate to severe aortic stenosis (mean gradient 27 mmHg, AVA 1.1 cm), RA 1, RV 19/0, PA 20/8/12, PW CP 8, LVEDP 8, CI 3.2.  CTA shows tricuspid aortic valve with partial fusion of RCC/LCC, possibly a forme fruste bicuspid aortic valve.  Aortic valve calcium score is 388.  Echocardiogram on 07/16/2021 showed EF had improved to 65 to 70% and aortic stenosis was mild to moderate (V-max 2.8 m/s, mean gradient 18 mmHg, AVA 1.3 cm). -Suspect AS is more moderate, and she is asymptomatic, will monitor, plan repeat echocardiogram in 1 year  Hyperlipidemia: LDL 145 on 11/27/20.  10 year ASCVD risk score 7%, borderline for meeting indication to start statin.  Caclium score 0 on 01/16/21  Hypertension: Continue carvedilol and losartan as above   RTC in 6 months   Medication Adjustments/Labs and Tests Ordered: Current medicines are reviewed at length with the patient today.  Concerns regarding medicines are outlined above.  Orders Placed This Encounter  Procedures   EKG 12-Lead     No orders of the defined types were placed in this  encounter.      Patient Instructions  Medication Instructions:   No changes *If you need a refill on your cardiac medications before your next appointment, please call your pharmacy*   Lab Work: Not needed    Testing/Procedures: Not needed  Follow-Up: At [facility], you and your health needs are our priority.  As part of our continuing mission to provide you with exceptional heart care, we have created designated Provider Care Teams.  These Care Teams include your primary Cardiologist (physician) and Advanced Practice Providers (APPs -  Physician Assistants and Nurse Practitioners) who all work together to provide you with the care you need, when you need it.     Your next appointment:   6 month(s)  The format for your next appointment:   In Person  Provider:   None       Signed, Donato Heinz, MD  08/06/2021 12:35 PM    [place]

## 2021-08-06 ENCOUNTER — Other Ambulatory Visit: Payer: Self-pay

## 2021-08-06 ENCOUNTER — Ambulatory Visit: Payer: Medicare Other | Admitting: Cardiology

## 2021-08-06 ENCOUNTER — Encounter: Payer: Self-pay | Admitting: Cardiology

## 2021-08-06 VITALS — BP 126/80 | HR 70 | Ht 63.5 in | Wt 156.6 lb

## 2021-08-06 DIAGNOSIS — I10 Essential (primary) hypertension: Secondary | ICD-10-CM

## 2021-08-06 DIAGNOSIS — I5042 Chronic combined systolic (congestive) and diastolic (congestive) heart failure: Secondary | ICD-10-CM | POA: Diagnosis not present

## 2021-08-06 DIAGNOSIS — I350 Nonrheumatic aortic (valve) stenosis: Secondary | ICD-10-CM

## 2021-08-06 DIAGNOSIS — E785 Hyperlipidemia, unspecified: Secondary | ICD-10-CM | POA: Diagnosis not present

## 2021-08-06 NOTE — Patient Instructions (Addendum)
Medication Instructions:   No changes *If you need a refill on your cardiac medications before your next appointment, please call your pharmacy*   Lab Work: Not needed    Testing/Procedures: Not needed   Follow-Up: At [facility], you and your health needs are our priority.  As part of our continuing mission to provide you with exceptional heart care, we have created designated Provider Care Teams.  These Care Teams include your primary Cardiologist (physician) and Advanced Practice Providers (APPs -  Physician Assistants and Nurse Practitioners) who all work together to provide you with the care you need, when you need it.     Your next appointment:   6 month(s)  The format for your next appointment:   In Person  Provider:   None

## 2021-09-18 DIAGNOSIS — D225 Melanocytic nevi of trunk: Secondary | ICD-10-CM | POA: Diagnosis not present

## 2021-09-18 DIAGNOSIS — L821 Other seborrheic keratosis: Secondary | ICD-10-CM | POA: Diagnosis not present

## 2021-09-18 DIAGNOSIS — L578 Other skin changes due to chronic exposure to nonionizing radiation: Secondary | ICD-10-CM | POA: Diagnosis not present

## 2021-09-18 DIAGNOSIS — L820 Inflamed seborrheic keratosis: Secondary | ICD-10-CM | POA: Diagnosis not present

## 2021-09-20 DIAGNOSIS — H524 Presbyopia: Secondary | ICD-10-CM | POA: Diagnosis not present

## 2021-09-24 ENCOUNTER — Other Ambulatory Visit: Payer: Self-pay | Admitting: Family Medicine

## 2021-09-24 DIAGNOSIS — Z1231 Encounter for screening mammogram for malignant neoplasm of breast: Secondary | ICD-10-CM

## 2021-10-10 ENCOUNTER — Encounter: Payer: Self-pay | Admitting: Cardiology

## 2021-10-11 MED ORDER — CARVEDILOL 6.25 MG PO TABS: 6.2500 mg | ORAL_TABLET | Freq: Once | ORAL | 3 refills | Status: DC

## 2021-10-24 DIAGNOSIS — M25512 Pain in left shoulder: Secondary | ICD-10-CM | POA: Diagnosis not present

## 2021-10-24 DIAGNOSIS — M2550 Pain in unspecified joint: Secondary | ICD-10-CM | POA: Diagnosis not present

## 2021-11-26 DIAGNOSIS — M25512 Pain in left shoulder: Secondary | ICD-10-CM | POA: Diagnosis not present

## 2021-11-26 DIAGNOSIS — M25522 Pain in left elbow: Secondary | ICD-10-CM | POA: Diagnosis not present

## 2021-11-29 DIAGNOSIS — E7800 Pure hypercholesterolemia, unspecified: Secondary | ICD-10-CM | POA: Diagnosis not present

## 2021-11-29 DIAGNOSIS — L989 Disorder of the skin and subcutaneous tissue, unspecified: Secondary | ICD-10-CM | POA: Diagnosis not present

## 2021-11-29 DIAGNOSIS — E559 Vitamin D deficiency, unspecified: Secondary | ICD-10-CM | POA: Diagnosis not present

## 2021-11-29 DIAGNOSIS — Z0000 Encounter for general adult medical examination without abnormal findings: Secondary | ICD-10-CM | POA: Diagnosis not present

## 2021-11-29 DIAGNOSIS — M8580 Other specified disorders of bone density and structure, unspecified site: Secondary | ICD-10-CM | POA: Diagnosis not present

## 2021-11-29 DIAGNOSIS — R7303 Prediabetes: Secondary | ICD-10-CM | POA: Diagnosis not present

## 2021-12-06 DIAGNOSIS — R768 Other specified abnormal immunological findings in serum: Secondary | ICD-10-CM | POA: Diagnosis not present

## 2021-12-09 ENCOUNTER — Other Ambulatory Visit: Payer: Self-pay | Admitting: Family Medicine

## 2021-12-09 DIAGNOSIS — M8580 Other specified disorders of bone density and structure, unspecified site: Secondary | ICD-10-CM

## 2021-12-13 DIAGNOSIS — M25571 Pain in right ankle and joints of right foot: Secondary | ICD-10-CM | POA: Diagnosis not present

## 2021-12-13 DIAGNOSIS — S93402A Sprain of unspecified ligament of left ankle, initial encounter: Secondary | ICD-10-CM | POA: Diagnosis not present

## 2021-12-13 DIAGNOSIS — M7989 Other specified soft tissue disorders: Secondary | ICD-10-CM | POA: Diagnosis not present

## 2021-12-13 DIAGNOSIS — T1490XA Injury, unspecified, initial encounter: Secondary | ICD-10-CM | POA: Diagnosis not present

## 2021-12-13 DIAGNOSIS — M25572 Pain in left ankle and joints of left foot: Secondary | ICD-10-CM | POA: Diagnosis not present

## 2022-01-24 ENCOUNTER — Other Ambulatory Visit: Payer: Self-pay | Admitting: Cardiology

## 2022-02-02 ENCOUNTER — Other Ambulatory Visit: Payer: Self-pay | Admitting: Cardiology

## 2022-02-03 ENCOUNTER — Other Ambulatory Visit: Payer: Self-pay | Admitting: Cardiology

## 2022-02-04 ENCOUNTER — Other Ambulatory Visit: Payer: Self-pay

## 2022-02-04 ENCOUNTER — Encounter: Payer: Self-pay | Admitting: Cardiology

## 2022-02-04 MED ORDER — CARVEDILOL 6.25 MG PO TABS
6.2500 mg | ORAL_TABLET | Freq: Two times a day (BID) | ORAL | 3 refills | Status: DC
  Filled 2022-02-04: qty 180, 90d supply, fill #0

## 2022-03-14 DIAGNOSIS — E7800 Pure hypercholesterolemia, unspecified: Secondary | ICD-10-CM | POA: Diagnosis not present

## 2022-03-14 DIAGNOSIS — F321 Major depressive disorder, single episode, moderate: Secondary | ICD-10-CM | POA: Diagnosis not present

## 2022-03-14 DIAGNOSIS — M25512 Pain in left shoulder: Secondary | ICD-10-CM | POA: Diagnosis not present

## 2022-03-14 DIAGNOSIS — R7303 Prediabetes: Secondary | ICD-10-CM | POA: Diagnosis not present

## 2022-04-09 ENCOUNTER — Ambulatory Visit
Admission: RE | Admit: 2022-04-09 | Discharge: 2022-04-09 | Disposition: A | Discharge status: Home / Self Care | Payer: Medicare Other | Source: Ambulatory Visit | Attending: Family Medicine | Admitting: Family Medicine

## 2022-04-09 DIAGNOSIS — Z1231 Encounter for screening mammogram for malignant neoplasm of breast: Secondary | ICD-10-CM | POA: Diagnosis not present

## 2022-04-24 ENCOUNTER — Other Ambulatory Visit: Payer: Self-pay | Admitting: Cardiology

## 2022-04-24 ENCOUNTER — Ambulatory Visit: Payer: Medicare Other | Admitting: Cardiology

## 2022-05-13 ENCOUNTER — Ambulatory Visit: Payer: Medicare Other | Attending: Cardiology | Admitting: Cardiology

## 2022-05-13 ENCOUNTER — Encounter: Payer: Self-pay | Admitting: Cardiology

## 2022-05-13 VITALS — BP 122/78 | HR 82 | Ht 63.0 in | Wt 162.0 lb

## 2022-05-13 DIAGNOSIS — E785 Hyperlipidemia, unspecified: Secondary | ICD-10-CM

## 2022-05-13 DIAGNOSIS — I350 Nonrheumatic aortic (valve) stenosis: Secondary | ICD-10-CM

## 2022-05-13 DIAGNOSIS — I10 Essential (primary) hypertension: Secondary | ICD-10-CM

## 2022-05-13 DIAGNOSIS — I5042 Chronic combined systolic (congestive) and diastolic (congestive) heart failure: Secondary | ICD-10-CM

## 2022-05-13 NOTE — Progress Notes (Signed)
Cardiology Office Note:    Date:  09/62/8366   ID:  Honor, Frison [birth year], MRN [id number]  PCP:  Caren Macadam, MD  Cardiologist:  Donato Heinz, MD  Electrophysiologist:  None   Referring MD: Caren Macadam, MD   No chief complaint on file.    History of Present Illness:    [name] is a 67 y.o. female with a hx of breast cancer, hyperlipidemia who presents for follow-up.  She was referred by Dr. Mannie Stabile for evaluation of heart murmur, initially seen on 10/24/2019.  She reports that she followed at [facility] as a child, was told she had an issue with her aortic valve.  She had a heart catheterization done in middle school.  States that she followed with cardiology there until college, has not been seen for 40 years.  Reports that she was told that everything resolved and she did not need further cardiology follow-up.  She denies any chest pain or dyspnea.  States that she walks about twice per week, usually from 45 minutes to an hour.  Prior to pandemic was walking for 1 to 2 hours.  She denies any lightheadedness, syncope, or palpitations.  Does report occasional lower extremity edema.  No history of smoking.  Family history includes mother died of MI at age 54, and brother had CABG/AVR and died at age 47.  TTE 02/17/2007 showed LVEF 55%, mild LV dilatation, mild MR, mild AI.  TTE on 11/14/2019 showed LVEF 65 to 70%, indeterminate diastolic filling, normal RV systolic function, mild aortic stenosis, mild aortic regurgitation, mild mitral regurgitation.  Echocardiogram on 01/16/2021 showed EF 40 to 45%, mild RV dysfunction, moderate to severe aortic stenosis (V-max 3.0 m/s, mean gradient 20 mmHg, AVA 0.8 cm, DI 0.24). LHC/RHC on 02/13/21 showed no evidence of CAD, moderate to severe aortic stenosis (mean gradient 27 mmHg, AVA 1.1 cm), RA 1, RV 19/0, PA 20/8/12, PW CP 8, LVEDP 8, CI 3.2.  CTA shows tricuspid aortic valve with partial fusion of RCC/LCC, possibly a  forme fruste bicuspid aortic valve.  Echocardiogram on 07/16/2021 showed EF had improved to 65 to 70% and aortic stenosis was mild to moderate (V-max 2.8 m/s, mean gradient 18 mmHg, AVA 1.3 cm).  Since last clinic visit, she reports that she is doing well.  Denies any chest pain, dyspnea, lightheadedness, syncope, lower extremity edema, or palpitations.  Does water aerobics 4-5 times per week for 1 hour.   Past Medical History:  Diagnosis Date   Breast cancer [facility]) 2004   right breast; ER/PR+, Her2+   Cancer ([place]) April 2004   right breast lumpectomy   GERD (gastroesophageal reflux disease)    Hyperlipidemia    Personal history of chemotherapy 11/19/2002   Personal history of radiation therapy 02/19/2003    Past Surgical History:  Procedure Laterality Date   BREAST BIOPSY Right 2019   BREAST LUMPECTOMY Right    BREAST SURGERY  10/2002   lumpectomy / right side   RIGHT/LEFT HEART CATH AND CORONARY ANGIOGRAPHY N/A 02/13/2021   Procedure: RIGHT/LEFT HEART CATH AND CORONARY ANGIOGRAPHY;  Surgeon: Burnell Blanks, MD;  Location: [facility];  Service: Cardiovascular;  Laterality: N/A;    Current Medications: Current Meds  Medication Sig   acetaminophen (TYLENOL) 325 MG tablet 1 tablet as needed   Calcium Carbonate (CALCI-CHEW PO) Take 1 tablet by mouth in the morning.   carvedilol (COREG) 6.25 MG tablet Take 1 tablet (6.25 mg total) by mouth 2 (  two) times daily with a meal.   escitalopram (LEXAPRO) 5 MG tablet Take 5 mg by mouth in the morning.   losartan (COZAAR) 25 MG tablet TAKE 1 TABLET(25 MG) BY MOUTH DAILY   Vitamin D, Ergocalciferol, (DRISDOL) 1.25 MG (50000 UNIT) CAPS capsule Take 50,000 Units by mouth once a week.     Allergies:   Patient has no known allergies.   Social History   Socioeconomic History   Marital status: Divorced    Spouse name: Not on file   Number of children: Not on file   Years of education: Not on file   Highest education level:  Not on file  Occupational History   Not on file  Tobacco Use   Smoking status: Never   Smokeless tobacco: Never  Substance and Sexual Activity   Alcohol use: Yes    Alcohol/week: 1.0 - 2.0 standard drink of alcohol    Types: 1 - 2 Glasses of wine per week    Comment: occasional   Drug use: No   Sexual activity: Not on file  Other Topics Concern   Not on file  Social History Narrative   Not on file   Social Determinants of Health   Financial Resource Strain: Not on file  Food Insecurity: Not on file  Transportation Needs: Not on file  Physical Activity: Not on file  Stress: Not on file  Social Connections: Not on file     Family History: The patient's family history includes Alzheimer's disease in her paternal aunt and paternal uncle; Bladder Cancer in her brother; Diabetes in her brother, maternal aunt, maternal uncle, and mother; Heart Problems in her maternal uncle; Heart attack in her maternal grandfather, paternal grandfather, and paternal grandmother; Heart disease in her brother, maternal aunt, mother, and paternal aunt; Kidney failure in her brother and mother; Lung cancer (age of onset: 51) in her father; Skin cancer in her father and paternal uncle. There is no history of Breast cancer.  ROS:   Please see the history of present illness.  All other systems reviewed and are negative.  EKGs/Labs/Other Studies Reviewed:    The following studies were reviewed today:   EKG:   05/13/22: Normal sinus rhythm, rate 82, LVH, Q waves in V1-3 08/06/21: Normal sinus rhythm, rate 70, LVH, left axis deviation 07/22:  NSR, rate 69, LVH 11/27/2020 (PCP Office): NSR, rate 69 bpm, LBBB 11/29/2019: EKG was not ordered.   10/24/2019: normal sinus rhythm, rate 73, Q waves in V1/2, no ST/T abnormalities  TTE 11/14/19:  1. Left ventricular ejection fraction, by estimation, is 65 to 70%. The  left ventricle has normal function. The left ventricle has no regional  wall motion  abnormalities. Indeterminate diastolic filling due to E-A  fusion.   2. Right ventricular systolic function is normal. The right ventricular  size is normal. There is normal pulmonary artery systolic pressure.   3. The mitral valve is normal in structure. Mild mitral valve  regurgitation.   4. AV is thickened, calcified Peak and mean gradients through the valve  are 29 and 17 mm Hg respectively consistent with mild aortic stenosis..  The aortic valve is tricuspid. Aortic valve regurgitation is mild. Mild  aortic valve stenosis.   5. The inferior vena cava is normal in size with greater than 50%  respiratory variability, suggesting right atrial pressure of 3 mmHg.   Recent Labs: No results found for requested labs within last 365 days.  Recent Lipid Panel No results found for: "  CHOL", "TRIG", "HDL", "CHOLHDL", "VLDL", "LDLCALC", "LDLDIRECT"  Physical Exam:    VS:  BP 122/78 (BP Location: Left Arm, Patient Position: Sitting, Cuff Size: Normal)   Pulse 82   Ht _0  (1.6 m)   Wt 162 lb (73.5 kg)   SpO2 96%   BMI 28.70 kg/m     Wt Readings from Last 3 Encounters:  05/13/22 162 lb (73.5 kg)  08/06/21 156 lb 9.6 oz (71 kg)  06/11/21 156 lb 6.4 oz (70.9 kg)     GEN:  Well nourished, well developed in no acute distress HEENT: Normal NECK: No JVD CARDIAC: RRR,  2/6 systolic heart murmur loudest at the RUSB RESPIRATORY:  Clear to auscultation without rales, wheezing or rhonchi  ABDOMEN: Soft, non-tender, non-distended MUSCULOSKELETAL:  No edema; No deformity  SKIN: Warm and dry NEUROLOGIC:  Alert and oriented x 3 PSYCHIATRIC:  Normal affect   ASSESSMENT:    1. Chronic combined systolic and diastolic heart failure ([place])   2. Aortic valve stenosis, etiology of cardiac valve disease unspecified   3. Essential hypertension   4. Hyperlipidemia, unspecified hyperlipidemia type     PLAN:    Chronic combined systolic and diastolic heart failure:  new diagnosis.  Echocardiogram on  01/16/2021 showed EF 40 to 45%, mild RV dysfunction, moderate to severe aortic stenosis (V-max 3.0 m/s, mean gradient 20 mmHg, AVA 0.8 cm, DI 0.24).  LHC/RHC on 02/13/21 showed no evidence of CAD, moderate to severe aortic stenosis (mean gradient 27 mmHg, AVA 1.1 cm), RA 1, RV 19/0, PA 20/8/12, PW CP 8, LVEDP 8, CI 3.2.  CTA shows tricuspid aortic valve with partial fusion of RCC/LCC, possibly a forme fruste bicuspid aortic valve.  Echocardiogram on 07/16/2021 showed EF had improved to 65 to 70% and aortic stenosis was mild to moderate (V-max 2.8 m/s, mean gradient 18 mmHg, AVA 1.3 cm). -Continue Coreg 3.125 mg twice daily -Continue losartan 25 mg daily -Discussed cardiac MRI for work-up nonischemic cardiomyopathy, but patient reports she has severe claustrophobia and is unable to tolerate MRI.  Will hold off given improvement in systolic function  Aortic stenosis: Echocardiogram on 01/16/2021 showed EF 40 to 45%, mild RV dysfunction, moderate to severe aortic stenosis (V-max 3.0 m/s, mean gradient 20 mmHg, AVA 0.8 cm, DI 0.24).  AV calcium score only 365 on 01/16/21, argues against severe AS.  LHC/RHC on 02/13/21 showed no evidence of CAD, moderate to severe aortic stenosis (mean gradient 27 mmHg, AVA 1.1 cm), RA 1, RV 19/0, PA 20/8/12, PW CP 8, LVEDP 8, CI 3.2.  CTA shows tricuspid aortic valve with partial fusion of RCC/LCC, possibly a forme fruste bicuspid aortic valve.  Aortic valve calcium score is 388.  Echocardiogram on 07/16/2021 showed EF had improved to 65 to 70% and aortic stenosis was mild to moderate (V-max 2.8 m/s, mean gradient 18 mmHg, AVA 1.3 cm). -Suspect AS is more moderate, and she is asymptomatic, will monitor, plan repeat echocardiogram in 1 year (06/2022)  Hyperlipidemia: LDL 145 on 11/27/20.  10 year ASCVD risk score 7%, borderline for meeting indication to start statin.  Caclium score 0 on 01/16/21  Hypertension: Continue carvedilol and losartan as above   RTC in 6  months   Medication Adjustments/Labs and Tests Ordered: Current medicines are reviewed at length with the patient today.  Concerns regarding medicines are outlined above.  Orders Placed This Encounter  Procedures   EKG 12-Lead   ECHOCARDIOGRAM COMPLETE     No orders of the defined types  were placed in this encounter.      Patient Instructions  Medication Instructions:  Your physician recommends that you continue on your current medications as directed. Please refer to the Current Medication list given to you today.  *If you need a refill on your cardiac medications before your next appointment, please call your pharmacy*  Testing/Procedures: Your physician has requested that you have an echocardiogram in [place]. Echocardiography is a painless test that uses sound waves to create images of your heart. It provides your doctor with information about the size and shape of your heart and how well your heart's chambers and valves are working. This procedure takes approximately one hour. There are no restrictions for this procedure. Please do NOT wear cologne, perfume, aftershave, or lotions (deodorant is allowed). Please arrive 15 minutes prior to your appointment time.  Follow-Up: At [facility], you and your health needs are our priority.  As part of our continuing mission to provide you with exceptional heart care, we have created designated Provider Care Teams.  These Care Teams include your primary Cardiologist (physician) and Advanced Practice Providers (APPs -  Physician Assistants and Nurse Practitioners) who all work together to provide you with the care you need, when you need it.  We recommend signing up for the patient portal called "MyChart".  Sign up information is provided on this After Visit Summary.  MyChart is used to connect with patients for Virtual Visits (Telemedicine).  Patients are able to view lab/test results, encounter notes, upcoming appointments,  etc.  Non-urgent messages can be sent to your provider as well.   To learn more about what you can do with MyChart, go to [email or web address].    Your next appointment:   6 month(s)  The format for your next appointment:   In Person  Provider:   Donato Heinz, MD              Signed, Donato Heinz, MD  05/13/2022 2:42 PM    [place]

## 2022-05-13 NOTE — Patient Instructions (Signed)
Medication Instructions:  Your physician recommends that you continue on your current medications as directed. Please refer to the Current Medication list given to you today.  *If you need a refill on your cardiac medications before your next appointment, please call your pharmacy*  Testing/Procedures: Your physician has requested that you have an echocardiogram in [place]. Echocardiography is a painless test that uses sound waves to create images of your heart. It provides your doctor with information about the size and shape of your heart and how well your heart's chambers and valves are working. This procedure takes approximately one hour. There are no restrictions for this procedure. Please do NOT wear cologne, perfume, aftershave, or lotions (deodorant is allowed). Please arrive 15 minutes prior to your appointment time.  Follow-Up: At [facility], you and your health needs are our priority.  As part of our continuing mission to provide you with exceptional heart care, we have created designated Provider Care Teams.  These Care Teams include your primary Cardiologist (physician) and Advanced Practice Providers (APPs -  Physician Assistants and Nurse Practitioners) who all work together to provide you with the care you need, when you need it.  We recommend signing up for the patient portal called "MyChart".  Sign up information is provided on this After Visit Summary.  MyChart is used to connect with patients for Virtual Visits (Telemedicine).  Patients are able to view lab/test results, encounter notes, upcoming appointments, etc.  Non-urgent messages can be sent to your provider as well.   To learn more about what you can do with MyChart, go to [email or web address].    Your next appointment:   6 month(s)  The format for your next appointment:   In Person  Provider:   Donato Heinz, MD

## 2022-05-30 ENCOUNTER — Ambulatory Visit
Admission: RE | Admit: 2022-05-30 | Discharge: 2022-05-30 | Disposition: A | Discharge status: Home / Self Care | Payer: Medicare Other | Source: Ambulatory Visit | Attending: Family Medicine | Admitting: Family Medicine

## 2022-05-30 DIAGNOSIS — M8580 Other specified disorders of bone density and structure, unspecified site: Secondary | ICD-10-CM

## 2022-05-30 DIAGNOSIS — M8588 Other specified disorders of bone density and structure, other site: Secondary | ICD-10-CM | POA: Diagnosis not present

## 2022-05-30 DIAGNOSIS — Z780 Asymptomatic menopausal state: Secondary | ICD-10-CM | POA: Diagnosis not present

## 2022-06-05 DIAGNOSIS — M25512 Pain in left shoulder: Secondary | ICD-10-CM | POA: Diagnosis not present

## 2022-06-27 DIAGNOSIS — E559 Vitamin D deficiency, unspecified: Secondary | ICD-10-CM | POA: Diagnosis not present

## 2022-06-27 DIAGNOSIS — F321 Major depressive disorder, single episode, moderate: Secondary | ICD-10-CM | POA: Diagnosis not present

## 2022-06-27 DIAGNOSIS — R7303 Prediabetes: Secondary | ICD-10-CM | POA: Diagnosis not present

## 2022-06-27 DIAGNOSIS — I10 Essential (primary) hypertension: Secondary | ICD-10-CM | POA: Diagnosis not present

## 2022-06-27 DIAGNOSIS — E7800 Pure hypercholesterolemia, unspecified: Secondary | ICD-10-CM | POA: Diagnosis not present

## 2022-06-27 DIAGNOSIS — I350 Nonrheumatic aortic (valve) stenosis: Secondary | ICD-10-CM | POA: Diagnosis not present

## 2022-06-30 ENCOUNTER — Ambulatory Visit (HOSPITAL_COMMUNITY): Payer: Medicare Other | Attending: Cardiology

## 2022-06-30 DIAGNOSIS — C50911 Malignant neoplasm of unspecified site of right female breast: Secondary | ICD-10-CM | POA: Insufficient documentation

## 2022-06-30 DIAGNOSIS — E785 Hyperlipidemia, unspecified: Secondary | ICD-10-CM | POA: Diagnosis not present

## 2022-06-30 DIAGNOSIS — Z9221 Personal history of antineoplastic chemotherapy: Secondary | ICD-10-CM | POA: Insufficient documentation

## 2022-06-30 DIAGNOSIS — I350 Nonrheumatic aortic (valve) stenosis: Secondary | ICD-10-CM

## 2022-06-30 DIAGNOSIS — I080 Rheumatic disorders of both mitral and aortic valves: Secondary | ICD-10-CM | POA: Diagnosis not present

## 2022-06-30 DIAGNOSIS — I352 Nonrheumatic aortic (valve) stenosis with insufficiency: Secondary | ICD-10-CM | POA: Diagnosis present

## 2022-06-30 DIAGNOSIS — Z8249 Family history of ischemic heart disease and other diseases of the circulatory system: Secondary | ICD-10-CM | POA: Insufficient documentation

## 2022-06-30 DIAGNOSIS — R600 Localized edema: Secondary | ICD-10-CM | POA: Insufficient documentation

## 2022-06-30 LAB — ECHOCARDIOGRAM COMPLETE
AR max vel: 0.88 cm2
AV Area VTI: 0.84 cm2
AV Area mean vel: 0.9 cm2
AV Mean grad: 19 mmHg
AV Peak grad: 35 mmHg
Ao pk vel: 2.96 m/s
Area-P 1/2: 3.53 cm2
P 1/2 time: 520 msec
S' Lateral: 1.9 cm

## 2022-07-07 ENCOUNTER — Other Ambulatory Visit: Payer: Self-pay | Admitting: *Deleted

## 2022-07-07 DIAGNOSIS — I350 Nonrheumatic aortic (valve) stenosis: Secondary | ICD-10-CM

## 2022-08-11 DIAGNOSIS — M79672 Pain in left foot: Secondary | ICD-10-CM | POA: Diagnosis not present

## 2022-08-11 DIAGNOSIS — G5762 Lesion of plantar nerve, left lower limb: Secondary | ICD-10-CM | POA: Diagnosis not present

## 2022-09-18 ENCOUNTER — Encounter: Payer: Self-pay | Admitting: Cardiology

## 2022-09-19 ENCOUNTER — Other Ambulatory Visit: Payer: Self-pay | Admitting: Family Medicine

## 2022-09-19 DIAGNOSIS — Z1231 Encounter for screening mammogram for malignant neoplasm of breast: Secondary | ICD-10-CM

## 2022-10-06 DIAGNOSIS — G5762 Lesion of plantar nerve, left lower limb: Secondary | ICD-10-CM | POA: Diagnosis not present

## 2022-10-09 DIAGNOSIS — H524 Presbyopia: Secondary | ICD-10-CM | POA: Diagnosis not present

## 2022-10-21 DIAGNOSIS — H524 Presbyopia: Secondary | ICD-10-CM | POA: Diagnosis not present

## 2022-11-01 ENCOUNTER — Other Ambulatory Visit: Payer: Self-pay | Admitting: Cardiology

## 2022-11-02 NOTE — Progress Notes (Unsigned)
Cardiology Office Note:    Date:  11/04/2022   ID:  Monti, Villers 05/15/1956, MRN [id number]  PCP:  Aliene Beams, MD  Cardiologist:  Little Ishikawa, MD  Electrophysiologist:  None   Referring MD: Aliene Beams, MD   Chief Complaint  Patient presents with   Congestive Heart Failure     History of Present Illness:    [name] is a 67 y.o. female with a hx of breast cancer, hyperlipidemia who presents for follow-up.  She was referred by Dr. Tracie Harrier for evaluation of heart murmur, initially seen on 10/24/2019.  She reports that she followed at [facility] as a child, was told she had an issue with her aortic valve.  She had a heart catheterization done in middle school.  States that she followed with cardiology there until college, has not been seen for 40 years.  Reports that she was told that everything resolved and she did not need further cardiology follow-up.  She denies any chest pain or dyspnea.  States that she walks about twice per week, usually from 45 minutes to an hour.  Prior to pandemic was walking for 1 to 2 hours.  She denies any lightheadedness, syncope, or palpitations.  Does report occasional lower extremity edema.  No history of smoking.  Family history includes mother died of MI at age 9, and brother had CABG/AVR and died at age 17.  TTE February 06, 2007 showed LVEF 55%, mild LV dilatation, mild MR, mild AI.  TTE on 11/14/2019 showed LVEF 65 to 70%, indeterminate diastolic filling, normal RV systolic function, mild aortic stenosis, mild aortic regurgitation, mild mitral regurgitation.  Echocardiogram on 01/16/2021 showed EF 40 to 45%, mild RV dysfunction, moderate to severe aortic stenosis (V-max 3.0 m/s, mean gradient 20 mmHg, AVA 0.8 cm, DI 0.24). LHC/RHC on 02/13/21 showed no evidence of CAD, moderate to severe aortic stenosis (mean gradient 27 mmHg, AVA 1.1 cm), RA 1, RV 19/0, PA 20/8/12, PW CP 8, LVEDP 8, CI 3.2.  CTA shows tricuspid aortic valve with  partial fusion of RCC/LCC, possibly a forme fruste bicuspid aortic valve.  Echocardiogram on 07/16/2021 showed EF had improved to 65 to 70% and aortic stenosis was mild to moderate (V-max 2.8 m/s, mean gradient 18 mmHg, AVA 1.3 cm).  Echocardiogram 06/2022 showed an EF 60 to 65%, normal RV function, moderate to severe aortic stenosis (Vmax 3.0 m/s, MG 19 mmHg, AVA 0.8 cm^2, DI 0.27).  Since last clinic visit, she reports she is doing okay.  States that she recently went for a long walk and started having chest pressure and shortness of breath.  Also felt lightheaded.  Denies any syncope.  Past Medical History:  Diagnosis Date   Breast cancer 2004   right breast; ER/PR+, Her2+   Cancer April 2004   right breast lumpectomy   GERD (gastroesophageal reflux disease)    Hyperlipidemia    Personal history of chemotherapy 11/19/2002   Personal history of radiation therapy 02/19/2003    Past Surgical History:  Procedure Laterality Date   BREAST BIOPSY Right 2019   BREAST LUMPECTOMY Right    BREAST SURGERY  10/2002   lumpectomy / right side   RIGHT/LEFT HEART CATH AND CORONARY ANGIOGRAPHY N/A 02/13/2021   Procedure: RIGHT/LEFT HEART CATH AND CORONARY ANGIOGRAPHY;  Surgeon: Kathleene Hazel, MD;  Location: [facility];  Service: Cardiovascular;  Laterality: N/A;    Current Medications: Current Meds  Medication Sig   acetaminophen (TYLENOL) 325 MG tablet  1 tablet as needed   Calcium Carbonate (CALCI-CHEW PO) Take 1 tablet by mouth in the morning.   carvedilol (COREG) 6.25 MG tablet TAKE 1 TABLET BY MOUTH TWICE DAILY WITH A MEAL   escitalopram (LEXAPRO) 5 MG tablet Take 5 mg by mouth in the morning.   losartan (COZAAR) 25 MG tablet TAKE 1 TABLET(25 MG) BY MOUTH DAILY   Vitamin D, Ergocalciferol, (DRISDOL) 1.25 MG (50000 UNIT) CAPS capsule Take 50,000 Units by mouth once a week.     Allergies:   Patient has no active allergies.   Social History   Socioeconomic History    Marital status: Divorced    Spouse name: Not on file   Number of children: Not on file   Years of education: Not on file   Highest education level: Not on file  Occupational History   Not on file  Tobacco Use   Smoking status: Never   Smokeless tobacco: Never  Substance and Sexual Activity   Alcohol use: Yes    Alcohol/week: 1.0 - 2.0 standard drink of alcohol    Types: 1 - 2 Glasses of wine per week    Comment: occasional   Drug use: No   Sexual activity: Not on file  Other Topics Concern   Not on file  Social History Narrative   Not on file   Social Determinants of Health   Financial Resource Strain: Not on file  Food Insecurity: Not on file  Transportation Needs: Not on file  Physical Activity: Not on file  Stress: Not on file  Social Connections: Not on file     Family History: The patient's family history includes Alzheimer's disease in her paternal aunt and paternal uncle; Bladder Cancer in her brother; Diabetes in her brother, maternal aunt, maternal uncle, and mother; Heart Problems in her maternal uncle; Heart attack in her maternal grandfather, paternal grandfather, and paternal grandmother; Heart disease in her brother, maternal aunt, mother, and paternal aunt; Kidney failure in her brother and mother; Lung cancer (age of onset: 29) in her father; Skin cancer in her father and paternal uncle. There is no history of Breast cancer.  ROS:   Please see the history of present illness.  All other systems reviewed and are negative.  EKGs/Labs/Other Studies Reviewed:    The following studies were reviewed today:   EKG:   11/04/2022: Normal sinus rhythm, rate 71, LVH 05/13/22: Normal sinus rhythm, rate 82, LVH, Q waves in V1-3 08/06/21: Normal sinus rhythm, rate 70, LVH, left axis deviation 07/22:  NSR, rate 69, LVH 11/27/2020 (PCP Office): NSR, rate 69 bpm, LBBB 11/29/2019: EKG was not ordered.   10/24/2019: normal sinus rhythm, rate 73, Q waves in V1/2, no ST/T  abnormalities  TTE 11/14/19:  1. Left ventricular ejection fraction, by estimation, is 65 to 70%. The  left ventricle has normal function. The left ventricle has no regional  wall motion abnormalities. Indeterminate diastolic filling due to E-A  fusion.   2. Right ventricular systolic function is normal. The right ventricular  size is normal. There is normal pulmonary artery systolic pressure.   3. The mitral valve is normal in structure. Mild mitral valve  regurgitation.   4. AV is thickened, calcified Peak and mean gradients through the valve  are 29 and 17 mm Hg respectively consistent with mild aortic stenosis..  The aortic valve is tricuspid. Aortic valve regurgitation is mild. Mild  aortic valve stenosis.   5. The inferior vena cava is normal in size  with greater than 50%  respiratory variability, suggesting right atrial pressure of 3 mmHg.   Recent Labs: No results found for requested labs within last 365 days.  Recent Lipid Panel No results found for: "CHOL", "TRIG", "HDL", "CHOLHDL", "VLDL", "LDLCALC", "LDLDIRECT"  Physical Exam:    VS:  BP 100/68 (BP Location: Left Arm, Patient Position: Sitting, Cuff Size: Normal)   Pulse 71   Ht 5\' 3"  (1.6 m)   Wt 153 lb (69.4 kg)   SpO2 95%   BMI 27.10 kg/m     Wt Readings from Last 3 Encounters:  11/04/22 153 lb (69.4 kg)  05/13/22 162 lb (73.5 kg)  08/06/21 156 lb 9.6 oz (71 kg)     GEN:  Well nourished, well developed in no acute distress HEENT: Normal NECK: No JVD CARDIAC: RRR,  2/6 systolic heart murmur loudest at the RUSB RESPIRATORY:  Clear to auscultation without rales, wheezing or rhonchi  ABDOMEN: Soft, non-tender, non-distended MUSCULOSKELETAL:  No edema; No deformity  SKIN: Warm and dry NEUROLOGIC:  Alert and oriented x 3 PSYCHIATRIC:  Normal affect   ASSESSMENT:    1. Aortic valve stenosis, etiology of cardiac valve disease unspecified   2. Chronic combined systolic and diastolic heart failure   3.  Hyperlipidemia, unspecified hyperlipidemia type   4. Essential hypertension      PLAN:    Aortic stenosis: Echocardiogram on 01/16/2021 showed EF 40 to 45%, mild RV dysfunction, moderate to severe aortic stenosis (V-max 3.0 m/s, mean gradient 20 mmHg, AVA 0.8 cm, DI 0.24).  AV calcium score only 365 on 01/16/21, argues against severe AS.  LHC/RHC on 02/13/21 showed no evidence of CAD, moderate to severe aortic stenosis (mean gradient 27 mmHg, AVA 1.1 cm), RA 1, RV 19/0, PA 20/8/12, PW CP 8, LVEDP 8, CI 3.2.  CTA shows tricuspid aortic valve with partial fusion of RCC/LCC, possibly a forme fruste bicuspid aortic valve.  Echocardiogram on 07/16/2021 showed EF had improved to 65 to 70% and aortic stenosis was mild to moderate (V-max 2.8 m/s, mean gradient 18 mmHg, AVA 1.3 cm).  Echocardiogram 06/2022 showed an EF 60 to 65%, normal RV function, moderate to severe aortic stenosis (Vmax 3.0 m/s, MG 19 mmHg, AVA 0.8 cm^2, DI 0.27). -She has been asymptomatic up to this point but reports that recently had chest pressure, shortness of breath, and lightheadedness while going for a walk.  Will update echocardiogram and refer to valve clinic  Chronic combined systolic and diastolic heart failure:  new diagnosis.  Echocardiogram on 01/16/2021 showed EF 40 to 45%, mild RV dysfunction, moderate to severe aortic stenosis (V-max 3.0 m/s, mean gradient 20 mmHg, AVA 0.8 cm, DI 0.24).  LHC/RHC on 02/13/21 showed no evidence of CAD, moderate to severe aortic stenosis (mean gradient 27 mmHg, AVA 1.1 cm), RA 1, RV 19/0, PA 20/8/12, PW CP 8, LVEDP 8, CI 3.2.  CTA shows tricuspid aortic valve with partial fusion of RCC/LCC, possibly a forme fruste bicuspid aortic valve.  Echocardiogram on 07/16/2021 showed EF had improved to 65 to 70% and aortic stenosis was mild to moderate (V-max 2.8 m/s, mean gradient 18 mmHg, AVA 1.3 cm). -Continue Coreg, will reduce dose to 3.125 mg twice daily given soft BP in clinic today -Continue  losartan 25 mg daily -Discussed cardiac MRI for work-up nonischemic cardiomyopathy, but patient reports she has severe claustrophobia and is unable to tolerate MRI.  Will hold off given improvement in systolic function  Hyperlipidemia: LDL 148 on 11/29/21.  10  year ASCVD risk score 7%, borderline for meeting indication to start statin.  Caclium score 0 on 01/16/21  Hypertension: On carvedilol and losartan.  BP soft in clinic today, will decrease carvedilol to 3.125 mg twice daily   RTC in 3 months   Medication Adjustments/Labs and Tests Ordered: Current medicines are reviewed at length with the patient today.  Concerns regarding medicines are outlined above.  Orders Placed This Encounter  Procedures   Ambulatory referral to Structural Heart/Valve Clinic (only at [facility])   EKG 12-Lead   ECHOCARDIOGRAM COMPLETE     No orders of the defined types were placed in this encounter.      Patient Instructions  Medication Instructions:  DECREASE carvedilol (Coreg) to 31.25 mg two times daily  *If you need a refill on your cardiac medications before your next appointment, please call your pharmacy*  Testing/Procedures: Your physician has requested that you have an echocardiogram. Echocardiography is a painless test that uses sound waves to create images of your heart. It provides your doctor with information about the size and shape of your heart and how well your heart's chambers and valves are working. This procedure takes approximately one hour. There are no restrictions for this procedure. Please do NOT wear cologne, perfume, aftershave, or lotions (deodorant is allowed). Please arrive 15 minutes prior to your appointment time.  Follow-Up: At [facility], you and your health needs are our priority.  As part of our continuing mission to provide you with exceptional heart care, we have created designated Provider Care Teams.  These Care Teams include your primary Cardiologist  (physician) and Advanced Practice Providers (APPs -  Physician Assistants and Nurse Practitioners) who all work together to provide you with the care you need, when you need it.  We recommend signing up for the patient portal called "MyChart".  Sign up information is provided on this After Visit Summary.  MyChart is used to connect with patients for Virtual Visits (Telemedicine).  Patients are able to view lab/test results, encounter notes, upcoming appointments, etc.  Non-urgent messages can be sent to your provider as well.   To learn more about what you can do with MyChart, go to [email or web address].    Your next appointment:   3 month(s)  Provider:   Little Ishikawa, MD     Other Instructions You have been referred to: Valve clinic-we will call to schedule this appointment       Signed, Little Ishikawa, MD  11/04/2022 1:56 PM    [facility]

## 2022-11-04 ENCOUNTER — Ambulatory Visit: Payer: Medicare Other | Attending: Cardiology | Admitting: Cardiology

## 2022-11-04 ENCOUNTER — Encounter: Payer: Self-pay | Admitting: Cardiology

## 2022-11-04 VITALS — BP 100/68 | HR 71 | Ht 63.0 in | Wt 153.0 lb

## 2022-11-04 DIAGNOSIS — I5042 Chronic combined systolic (congestive) and diastolic (congestive) heart failure: Secondary | ICD-10-CM

## 2022-11-04 DIAGNOSIS — I10 Essential (primary) hypertension: Secondary | ICD-10-CM

## 2022-11-04 DIAGNOSIS — E785 Hyperlipidemia, unspecified: Secondary | ICD-10-CM

## 2022-11-04 DIAGNOSIS — I350 Nonrheumatic aortic (valve) stenosis: Secondary | ICD-10-CM

## 2022-11-04 MED ORDER — CARVEDILOL 3.125 MG PO TABS: 3.1250 mg | ORAL_TABLET | Freq: Two times a day (BID) | ORAL | 3 refills | Status: DC

## 2022-11-04 NOTE — Patient Instructions (Addendum)
Medication Instructions:  DECREASE carvedilol (Coreg) to 3.125 mg two times daily  *If you need a refill on your cardiac medications before your next appointment, please call your pharmacy*  Testing/Procedures: Your physician has requested that you have an echocardiogram. Echocardiography is a painless test that uses sound waves to create images of your heart. It provides your doctor with information about the size and shape of your heart and how well your heart's chambers and valves are working. This procedure takes approximately one hour. There are no restrictions for this procedure. Please do NOT wear cologne, perfume, aftershave, or lotions (deodorant is allowed). Please arrive 15 minutes prior to your appointment time.  Follow-Up: At [facility], you and your health needs are our priority.  As part of our continuing mission to provide you with exceptional heart care, we have created designated Provider Care Teams.  These Care Teams include your primary Cardiologist (physician) and Advanced Practice Providers (APPs -  Physician Assistants and Nurse Practitioners) who all work together to provide you with the care you need, when you need it.  We recommend signing up for the patient portal called "MyChart".  Sign up information is provided on this After Visit Summary.  MyChart is used to connect with patients for Virtual Visits (Telemedicine).  Patients are able to view lab/test results, encounter notes, upcoming appointments, etc.  Non-urgent messages can be sent to your provider as well.   To learn more about what you can do with MyChart, go to [email or web address].    Your next appointment:   3 month(s)  Provider:   Little Ishikawa, MD     Other Instructions You have been referred to: Valve clinic-we will call to schedule this appointment

## 2022-11-04 NOTE — Progress Notes (Signed)
                                                                                                                                                                                                                                                                                                                                                                                                                                                                                                                                                                                                                                                                                                                                                                                                                                                                                                                                                                                                                                                                                                                                                                                                                                                                                                                                                                                                                                                                                                                                                                                                                                                                                                                                                                                                                                                                                                                                                                                                                                                                                                                                                                                                                                                                                Patient ID: Eran Mirra, female    DOB: 03/09/1956, 67 y.o.   MRN: [id number]  HPI    Review of Systems    Physical Exam

## 2022-11-12 ENCOUNTER — Ambulatory Visit (HOSPITAL_COMMUNITY): Payer: Medicare Other | Attending: Cardiology

## 2022-11-12 DIAGNOSIS — I350 Nonrheumatic aortic (valve) stenosis: Secondary | ICD-10-CM

## 2022-11-12 LAB — ECHOCARDIOGRAM COMPLETE
AR max vel: 0.88 cm2
AV Area VTI: 0.79 cm2
AV Area mean vel: 0.84 cm2
AV Mean grad: 26.5 mmHg
AV Peak grad: 41.2 mmHg
Ao pk vel: 3.21 m/s
Area-P 1/2: 3.5 cm2
P 1/2 time: 579 msec
S' Lateral: 1.9 cm

## 2022-11-17 DIAGNOSIS — K080 Exfoliation of teeth due to systemic causes: Secondary | ICD-10-CM | POA: Diagnosis not present

## 2022-11-20 NOTE — Progress Notes (Signed)
[facility] Consult Note  Chief Complaint  Patient presents with   New Patient (Initial Visit)    Severe aortic valve stenosis   History of Present Illness: 67 yo female with history of breast cancer, hyperlipidemia, HTN, arthritis, GERD and severe aortic stenosis who is here today as a new consult, referred by Dr. Bjorn Cox, for further discussion regarding her aortic stenosis and AVR vs TAVR. She has had breast cancer treated with lumpectomy, chemotherapy and radiation therapy. She has had a heart murmur since she was a child. She has been followed in our office since 2021 by Dr. Bjorn Cox for moderate aortic stenosis. Echo in 2023 with aortic stenosis with mean gradient 19 mmHg, AVA 0.8 cm2 and DI 0.27. Cardiac catheterization in July 2022 with no evidence of CAD. Coronary CTA August 2022 with tricuspid aortic valve with partial fusion of the right and left coronary cusps (Possible forme frust bicuspid AV). AV calcium score of 388. AV annular area of 354 mm2. Shallow left main height at 10.8 mm. She has had recent chest pain and dyspnea with exertion. Echo 11/12/22 with LVEF=55-60%. Mild mitral regurgitation. Severe paradoxical low flow/low gradient aortic stenosis with mean gradient 26.5 mmHg, peak gradient 41 mmHg, AVA 0.79 cm2, DI 0.25 SVI 38.   She tells me today that she has progressive fatigue and dyspnea with mild chest pressure. She is very active and she has noticed a clear difference over the past year in her exercise tolerance. No dizziness or lower extremity edema. She lives in [place], [place] alone. She is a retired from Clinical biochemist. No active dental issues.   Primary Care Physician: Tasha Beams, MD Primary Cardiologist: [name] Referring Cardiologist: [name]  Past Medical History:  Diagnosis Date   Aortic valve stenosis    Breast cancer [facility]) 2004   right breast; ER/PR+, Her2+   Cancer (HCC) 10/2002   right breast lumpectomy   CHF (congestive heart  failure) (HCC)    GERD (gastroesophageal reflux disease)    HTN (hypertension)    Hyperlipidemia    [place] neuroma    Personal history of chemotherapy 11/19/2002   Personal history of radiation therapy 02/19/2003    Past Surgical History:  Procedure Laterality Date   BREAST BIOPSY Right 2019   BREAST LUMPECTOMY Right    BREAST SURGERY  10/2002   lumpectomy / right side   RIGHT/LEFT HEART CATH AND CORONARY ANGIOGRAPHY N/A 02/13/2021   Procedure: RIGHT/LEFT HEART CATH AND CORONARY ANGIOGRAPHY;  Surgeon: Kathleene Hazel, MD;  Location: [facility];  Service: Cardiovascular;  Laterality: N/A;    Current Outpatient Medications  Medication Sig Dispense Refill   acetaminophen (TYLENOL) 325 MG tablet 1 tablet as needed     Calcium Carbonate (CALCI-CHEW PO) Take 1 tablet by mouth in the morning.     carvedilol (COREG) 3.125 MG tablet Take 1 tablet (3.125 mg total) by mouth 2 (two) times daily with a meal. 180 tablet 3   escitalopram (LEXAPRO) 5 MG tablet Take 5 mg by mouth in the morning.     losartan (COZAAR) 25 MG tablet TAKE 1 TABLET(25 MG) BY MOUTH DAILY 90 tablet 3   Vitamin D, Ergocalciferol, (DRISDOL) 1.25 MG (50000 UNIT) CAPS capsule Take 50,000 Units by mouth once a week.     No current facility-administered medications for this visit.    No Active Allergies  Social History   Socioeconomic History   Marital status: Divorced    Spouse name: Not on file  Number of children: 2   Years of education: Not on file   Highest education level: Not on file  Occupational History   Occupation: Retired-Customer Financial planner  Tobacco Use   Smoking status: Never   Smokeless tobacco: Never  Substance and Sexual Activity   Alcohol use: Yes    Alcohol/week: 1.0 - 2.0 standard drink of alcohol    Types: 1 - 2 Glasses of wine per week    Comment: occasional   Drug use: No   Sexual activity: Not on file  Other Topics Concern   Not on file  Social History Narrative    Not on file   Social Determinants of Health   Financial Resource Strain: Not on file  Food Insecurity: Not on file  Transportation Needs: Not on file  Physical Activity: Not on file  Stress: Not on file  Social Connections: Not on file  Intimate Partner Violence: Not on file    Family History  Problem Relation Age of Onset   Heart disease Mother    Diabetes Mother    Kidney failure Mother    Skin cancer Father        on ear; unknown type   Lung cancer Father 61       smoker; metastasis to bone and liver   Diabetes Brother    Heart disease Brother    Kidney failure Brother    Diabetes Maternal Aunt    Heart disease Maternal Aunt    Heart Problems Maternal Uncle    Diabetes Maternal Uncle    Skin cancer Paternal Uncle        on nose; unknown type   Heart attack Maternal Grandfather    Heart attack Paternal Grandmother    Heart attack Paternal Grandfather    Bladder Cancer Brother        dx. late 71s; smoker   Alzheimer's disease Paternal Aunt    Heart disease Paternal Aunt    Alzheimer's disease Paternal Uncle    Breast cancer Neg Hx     Review of Systems:  As stated in the HPI and otherwise negative.   BP 114/70   Pulse 81   Ht 5\' 3"  (1.6 m)   Wt 69 kg   SpO2 96%   BMI 26.96 kg/m   Physical Examination: General: Well developed, well nourished, NAD  HEENT: OP clear, mucus membranes moist  SKIN: warm, dry. No rashes. Neuro: No focal deficits  Musculoskeletal: Muscle strength 5/5 all ext  Psychiatric: Mood and affect normal  Neck: No JVD, no carotid bruits, no thyromegaly, no lymphadenopathy.  Lungs:Clear bilaterally, no wheezes, rhonci, crackles Cardiovascular: Regular rate and rhythm. Loud, harsh, late peaking systolic murmur.  Abdomen:Soft. Bowel sounds present. Non-tender.  Extremities: No lower extremity edema. Pulses are 2 + in the bilateral DP/PT.  EKG:  EKG is not ordered today. The ekg ordered today demonstrates   Echo 11/12/22: 1. Severe AS  (mean gradient 26 mmHg; AVA 0.79 cm2; DI 0.25).   2. Left ventricular ejection fraction, by estimation, is 55 to 60%. The  left ventricle has normal function. The left ventricle has no regional  wall motion abnormalities. There is mild left ventricular hypertrophy.  Left ventricular diastolic parameters  are consistent with Grade I diastolic dysfunction (impaired relaxation).  The average left ventricular global longitudinal strain is -17.6 %. The  global longitudinal strain is normal.   3. Right ventricular systolic function is normal. The right ventricular  size is normal.   4. The  mitral valve is normal in structure. Mild mitral valve  regurgitation. No evidence of mitral stenosis.   5. The aortic valve is tricuspid. Aortic valve regurgitation is mild.  Severe aortic valve stenosis.   6. The inferior vena cava is normal in size with greater than 50%  respiratory variability, suggesting right atrial pressure of 3 mmHg.   Comparison(s): Aortic Stenosis, Prior mean gradient- .     FINDINGS   Left Ventricle: Left ventricular ejection fraction, by estimation, is 55  to 60%. The left ventricle has normal function. The left ventricle has no  regional wall motion abnormalities. The average left ventricular global  longitudinal strain is -17.6 %.  The global longitudinal strain is normal. The left ventricular internal  cavity size was normal in size. There is mild left ventricular  hypertrophy. Left ventricular diastolic parameters are consistent with  Grade I diastolic dysfunction (impaired  relaxation).   Right Ventricle: The right ventricular size is normal. Right ventricular  systolic function is normal.   Left Atrium: Left atrial size was normal in size.   Right Atrium: Right atrial size was normal in size.   Pericardium: There is no evidence of pericardial effusion.   Mitral Valve: The mitral valve is normal in structure. Mild mitral valve  regurgitation. No evidence of  mitral valve stenosis.   Tricuspid Valve: The tricuspid valve is normal in structure. Tricuspid  valve regurgitation is mild . No evidence of tricuspid stenosis.   Aortic Valve: The aortic valve is tricuspid. Aortic valve regurgitation is  mild. Aortic regurgitation PHT measures 579 msec. Severe aortic stenosis  is present. Aortic valve mean gradient measures 26.5 mmHg. Aortic valve  peak gradient measures 41.2 mmHg.  Aortic valve area, by VTI measures 0.79 cm.   Pulmonic Valve: The pulmonic valve was normal in structure. Pulmonic valve  regurgitation is trivial. No evidence of pulmonic stenosis.   Aorta: The aortic root is normal in size and structure.   Venous: The inferior vena cava is normal in size with greater than 50%  respiratory variability, suggesting right atrial pressure of 3 mmHg.   IAS/Shunts: No atrial level shunt detected by color flow Doppler.   Additional Comments: Severe AS (mean gradient 26 mmHg; AVA 0.79 cm2; DI  0.25).     LEFT VENTRICLE  PLAX 2D  LVIDd:         3.50 cm   Diastology  LVIDs:         1.90 cm   LV e' medial:    5.76 cm/s  LV PW:         0.80 cm   LV E/e' medial:  14.2  LV IVS:        0.90 cm   LV e' lateral:   8.38 cm/s  LVOT diam:     2.00 cm   LV E/e' lateral: 9.8  LV SV:         66  LV SV Index:   38        2D Longitudinal Strain  LVOT Area:     3.14 cm  2D Strain GLS (A2C):   -19.6 %                           2D Strain GLS (A3C):   -23.2 %                           2D Strain GLS (  A4C):   -9.9 %                           2D Strain GLS Avg:     -17.6 %                             3D Volume EF:                           3D EF:        68 %                           LV EDV:       120 ml                           LV ESV:       38 ml                           LV SV:        82 ml   RIGHT VENTRICLE  RV Basal diam:  3.10 cm  RV S prime:     9.14 cm/s  TAPSE (M-mode): 1.4 cm  RVSP:           23.2 mmHg   LEFT ATRIUM             Index         RIGHT ATRIUM           Index  LA diam:        3.60 cm 2.09 cm/m   RA Pressure: 3.00 mmHg  LA Vol (A2C):   55.5 ml 32.16 ml/m  RA Area:     11.30 cm  LA Vol (A4C):   42.6 ml 24.69 ml/m  RA Volume:   26.30 ml  15.24 ml/m  LA Biplane Vol: 50.1 ml 29.03 ml/m   AORTIC VALVE  AV Area (Vmax):    0.88 cm  AV Area (Vmean):   0.84 cm  AV Area (VTI):     0.79 cm  AV Vmax:           321.00 cm/s  AV Vmean:          226.800 cm/s  AV VTI:            0.835 m  AV Peak Grad:      41.2 mmHg  AV Mean Grad:      26.5 mmHg  LVOT Vmax:         89.96 cm/s  LVOT Vmean:        60.520 cm/s  LVOT VTI:          0.210 m  LVOT/AV VTI ratio: 0.25  AI PHT:            579 msec    AORTA  Ao Root diam: 2.70 cm  Ao Asc diam:  3.40 cm   MITRAL VALVE                TRICUSPID VALVE  MV Area (PHT)  cm          TR Peak grad:   20.2 mmHg  MV Decel Time: 217 msec     TR Vmax:  225.00 cm/s  MV E velocity: 82.00 cm/s   Estimated RAP:  3.00 mmHg  MV A velocity: 105.50 cm/s  RVSP:           23.2 mmHg  MV E/A ratio:  0.78                              SHUNTS                              Systemic VTI:  0.21 m                              Systemic Diam: 2.00 cm    Recent Labs: No results found for requested labs within last 365 days.   Lipid Panel No results found for: "CHOL", "TRIG", "HDL", "CHOLHDL", "VLDL", "LDLCALC", "LDLDIRECT"   Wt Readings from Last 3 Encounters:  11/21/22 69 kg  11/04/22 69.4 kg  05/13/22 73.5 kg     Assessment and Plan:   1. Severe Aortic Valve Stenosis: She has moderately severe to severe bicuspid aortic valve stenosis. (paradoxical low flow/low gradient AS). She has NYHA class 2 symptoms. I have personally reviewed the echo images. The aortic valve is thickened and calcified but the valve leaflets have reasonable excursion. Her aortic valve visually appears to be moderately stenosed.  The valve appears to be bicuspid with known fusion of the right and left coronary cusps  by coronary CTA (Report: Possible forme frust bicuspid AV). Given her symptoms which are overall mild but a change for her, I will go ahead and refer her to CT surgery to discuss surgical AVR. Given her young age and anatomy with probable bicuspid AV, she would will likely be best treated with surgical AVR.  I have reviewed with her today the option of TAVR as well but have explained that surgical AVR may give her a better result at this time and would also give [name] the option for valve in valve TAVR in 12-14 years. She had a normal cardiac cath in July 2022 with no evidence of CAD. Coronary CTA and chest/abd/pelvis CTA August 2022. I do not think we need to repeat any of these studies but will have her see CT surgery to review best treatment plan and can change my plans if needed.   I have reviewed the natural history of aortic stenosis with the patient and their family members  who are present today. We have discussed the limitations of medical therapy and the poor prognosis associated with symptomatic aortic stenosis. We have reviewed potential treatment options, including palliative medical therapy, conventional surgical aortic valve replacement, and transcatheter aortic valve replacement. We discussed treatment options in the context of the patient's specific comorbid medical conditions.      Labs/ tests ordered today include:  No orders of the defined types were placed in this encounter.  Disposition:   F/U will be arranged with CT surgery  Signed, Verne Carrow, MD, [facility] 11/21/2022 10:12 AM    [facility] [address] [place], [place], [place]  [address] Phone: [phone]; Fax: [phone]

## 2022-11-21 ENCOUNTER — Encounter: Payer: Self-pay | Admitting: Cardiovascular Disease

## 2022-11-21 ENCOUNTER — Ambulatory Visit: Payer: Medicare Other | Attending: Cardiovascular Disease | Admitting: Cardiovascular Disease

## 2022-11-21 VITALS — BP 114/70 | HR 81 | Ht 63.0 in | Wt 152.2 lb

## 2022-11-21 DIAGNOSIS — I350 Nonrheumatic aortic (valve) stenosis: Secondary | ICD-10-CM

## 2022-11-21 NOTE — Patient Instructions (Signed)
Medication Instructions:  No changes *If you need a refill on your cardiac medications before your next appointment, please call your pharmacy*   Lab Work: none   Testing/Procedures: none   Follow-Up: Your next appointment is with the CT surgeons.  See below.

## 2022-12-08 ENCOUNTER — Institutional Professional Consult (permissible substitution): Payer: Medicare Other | Admitting: Thoracic Surgery (Cardiothoracic Vascular Surgery)

## 2022-12-08 ENCOUNTER — Encounter: Payer: Self-pay | Admitting: Thoracic Surgery (Cardiothoracic Vascular Surgery)

## 2022-12-08 VITALS — BP 134/83 | HR 83 | Resp 18 | Ht 63.0 in | Wt 155.0 lb

## 2022-12-08 DIAGNOSIS — I350 Nonrheumatic aortic (valve) stenosis: Secondary | ICD-10-CM

## 2022-12-08 NOTE — Patient Instructions (Signed)
Follow-up with Cardiology as scheduled

## 2022-12-08 NOTE — Progress Notes (Signed)
[address]       [place] [address]             [phone]           [facility] Medical Record #[id number] Date of Birth: 04-29-1956  [name], MD  Chief Complaint:   Aortic stenosis  History of Present Illness:     Pt is a very pleasant 67 yo female who interestingly had a murmur her whole life. As a child actually had a right arm cath that was in anticipation for AVR but was felt could be followed. Pt has had serial echos over the past several years with probably bicuspid aortic valve stenosis with mean gradients of 17, 19 and now more recently . She has normal LV function and felt to be suffering from paradoxical low flow low gradient AS. She over the past year has noted a decrease in her exercise abiltiy with needing to stop and catch her breath with walking long on a flat surface and with water aerobics. She had a cath 2 years ago with normal coronary arteries.She was evaluated and secondary to her age, bicuspid valve and left main just over 10mm in height, to best be served with SAVR over TAVR. Pt here for consultation and reports her symptoms are just moderate and that with her family situation, the thought of recovering from SAVR alone is not doable.       Past Medical History:  Diagnosis Date   Aortic valve stenosis    Breast cancer (HCC) 2004   right breast; ER/PR+, Her2+   Cancer (HCC) 10/2002   right breast lumpectomy   CHF (congestive heart failure) (HCC)    GERD (gastroesophageal reflux disease)    HTN (hypertension)    Hyperlipidemia    Morton neuroma    Personal history of chemotherapy 11/19/2002   Personal history of radiation therapy 02/19/2003    Past Surgical History:  Procedure Laterality Date   BREAST BIOPSY Right 2019   BREAST LUMPECTOMY Right    BREAST SURGERY  10/2002   lumpectomy / right side   RIGHT/LEFT HEART CATH AND CORONARY ANGIOGRAPHY N/A 02/13/2021   Procedure:  RIGHT/LEFT HEART CATH AND CORONARY ANGIOGRAPHY;  Surgeon: Kathleene Hazel, MD;  Location: [facility];  Service: Cardiovascular;  Laterality: N/A;    Social History   Tobacco Use  Smoking Status Never  Smokeless Tobacco Never    Social History   Substance and Sexual Activity  Alcohol Use Yes   Alcohol/week: 1.0 - 2.0 standard drink of alcohol   Types: 1 - 2 Glasses of wine per week   Comment: occasional    Social History   Socioeconomic History   Marital status: Divorced    Spouse name: Not on file   Number of children: 2   Years of education: Not on file   Highest education level: Not on file  Occupational History   Occupation: Retired-Customer Financial planner  Tobacco Use   Smoking status: Never   Smokeless tobacco: Never  Substance and Sexual Activity   Alcohol use: Yes    Alcohol/week: 1.0 - 2.0 standard drink of alcohol    Types: 1 - 2 Glasses of wine per week    Comment: occasional   Drug use: No   Sexual activity: Not on file  Other Topics Concern   Not on file  Social History Narrative   Not on file   Social  Determinants of Health   Financial Resource Strain: Not on file  Food Insecurity: Not on file  Transportation Needs: Not on file  Physical Activity: Not on file  Stress: Not on file  Social Connections: Not on file  Intimate Partner Violence: Not on file    No Active Allergies  Current Outpatient Medications  Medication Sig Dispense Refill   acetaminophen (TYLENOL) 325 MG tablet 1 tablet as needed     Calcium Carbonate (CALCI-CHEW PO) Take 1 tablet by mouth in the morning.     carvedilol (COREG) 3.125 MG tablet Take 1 tablet (3.125 mg total) by mouth 2 (two) times daily with a meal. 180 tablet 3   escitalopram (LEXAPRO) 5 MG tablet Take 5 mg by mouth in the morning.     losartan (COZAAR) 25 MG tablet TAKE 1 TABLET(25 MG) BY MOUTH DAILY 90 tablet 3   Vitamin D, Ergocalciferol, (DRISDOL) 1.25 MG (50000 UNIT) CAPS capsule Take  50,000 Units by mouth once a week.     No current facility-administered medications for this visit.     Family History  Problem Relation Age of Onset   Heart disease Mother    Diabetes Mother    Kidney failure Mother    Skin cancer Father        on ear; unknown type   Lung cancer Father 49       smoker; metastasis to bone and liver   Diabetes Brother    Heart disease Brother    Kidney failure Brother    Diabetes Maternal Aunt    Heart disease Maternal Aunt    Heart Problems Maternal Uncle    Diabetes Maternal Uncle    Skin cancer Paternal Uncle        on nose; unknown type   Heart attack Maternal Grandfather    Heart attack Paternal Grandmother    Heart attack Paternal Grandfather    Bladder Cancer Brother        dx. late 54s; smoker   Alzheimer's disease Paternal Aunt    Heart disease Paternal Aunt    Alzheimer's disease Paternal Uncle    Breast cancer Neg Hx        Physical Exam: Lungs: clear Card: RR with 2/6 sem Ext: no edema Neuro: intact     Diagnostic Studies & Laboratory data: I have personally reviewed the following studies and agree with the findings   TTE (10/2022) IMPRESSIONS     1. Severe AS (mean gradient 26 mmHg; AVA 0.79 cm2; DI 0.25).   2. Left ventricular ejection fraction, by estimation, is 55 to 60%. The  left ventricle has normal function. The left ventricle has no regional  wall motion abnormalities. There is mild left ventricular hypertrophy.  Left ventricular diastolic parameters  are consistent with Grade I diastolic dysfunction (impaired relaxation).  The average left ventricular global longitudinal strain is -17.6 %. The  global longitudinal strain is normal.   3. Right ventricular systolic function is normal. The right ventricular  size is normal.   4. The mitral valve is normal in structure. Mild mitral valve  regurgitation. No evidence of mitral stenosis.   5. The aortic valve is tricuspid. Aortic valve regurgitation is  mild.  Severe aortic valve stenosis.   6. The inferior vena cava is normal in size with greater than 50%  respiratory variability, suggesting right atrial pressure of 3 mmHg.   Comparison(s): Aortic Stenosis, Prior mean gradient- .     FINDINGS   Left Ventricle: Left  ventricular ejection fraction, by estimation, is 55  to 60%. The left ventricle has normal function. The left ventricle has no  regional wall motion abnormalities. The average left ventricular global  longitudinal strain is -17.6 %.  The global longitudinal strain is normal. The left ventricular internal  cavity size was normal in size. There is mild left ventricular  hypertrophy. Left ventricular diastolic parameters are consistent with  Grade I diastolic dysfunction (impaired  relaxation).   Right Ventricle: The right ventricular size is normal. Right ventricular  systolic function is normal.   Left Atrium: Left atrial size was normal in size.   Right Atrium: Right atrial size was normal in size.   Pericardium: There is no evidence of pericardial effusion.   Mitral Valve: The mitral valve is normal in structure. Mild mitral valve  regurgitation. No evidence of mitral valve stenosis.   Tricuspid Valve: The tricuspid valve is normal in structure. Tricuspid  valve regurgitation is mild . No evidence of tricuspid stenosis.   Aortic Valve: The aortic valve is tricuspid. Aortic valve regurgitation is  mild. Aortic regurgitation PHT measures 579 msec. Severe aortic stenosis  is present. Aortic valve mean gradient measures 26.5 mmHg. Aortic valve  peak gradient measures 41.2 mmHg.  Aortic valve area, by VTI measures 0.79 cm.   Pulmonic Valve: The pulmonic valve was normal in structure. Pulmonic valve  regurgitation is trivial. No evidence of pulmonic stenosis.   Aorta: The aortic root is normal in size and structure.   Venous: The inferior vena cava is normal in size with greater than 50%  respiratory  variability, suggesting right atrial pressure of 3 mmHg.   IAS/Shunts: No atrial level shunt detected by color flow Doppler.   Additional Comments: Severe AS (mean gradient 26 mmHg; AVA 0.79 cm2; DI  0.25).     LEFT VENTRICLE  PLAX 2D  LVIDd:         3.50 cm   Diastology  LVIDs:         1.90 cm   LV e' medial:    5.76 cm/s  LV PW:         0.80 cm   LV E/e' medial:  14.2  LV IVS:        0.90 cm   LV e' lateral:   8.38 cm/s  LVOT diam:     2.00 cm   LV E/e' lateral: 9.8  LV SV:         66  LV SV Index:   38        2D Longitudinal Strain  LVOT Area:     3.14 cm  2D Strain GLS (A2C):   -19.6 %                           2D Strain GLS (A3C):   -23.2 %                           2D Strain GLS (A4C):   -9.9 %                           2D Strain GLS Avg:     -17.6 %                             3D Volume EF:  3D EF:        68 %                           LV EDV:       120 ml                           LV ESV:       38 ml                           LV SV:        82 ml   RIGHT VENTRICLE  RV Basal diam:  3.10 cm  RV S prime:     9.14 cm/s  TAPSE (M-mode): 1.4 cm  RVSP:           23.2 mmHg   LEFT ATRIUM             Index        RIGHT ATRIUM           Index  LA diam:        3.60 cm 2.09 cm/m   RA Pressure: 3.00 mmHg  LA Vol (A2C):   55.5 ml 32.16 ml/m  RA Area:     11.30 cm  LA Vol (A4C):   42.6 ml 24.69 ml/m  RA Volume:   26.30 ml  15.24 ml/m  LA Biplane Vol: 50.1 ml 29.03 ml/m   AORTIC VALVE  AV Area (Vmax):    0.88 cm  AV Area (Vmean):   0.84 cm  AV Area (VTI):     0.79 cm  AV Vmax:           321.00 cm/s  AV Vmean:          226.800 cm/s  AV VTI:            0.835 m  AV Peak Grad:      41.2 mmHg  AV Mean Grad:      26.5 mmHg  LVOT Vmax:         89.96 cm/s  LVOT Vmean:        60.520 cm/s  LVOT VTI:          0.210 m  LVOT/AV VTI ratio: 0.25  AI PHT:            579 msec    AORTA  Ao Root diam: 2.70 cm  Ao Asc diam:  3.40 cm   MITRAL VALVE                 TRICUSPID VALVE  MV Area (PHT)  cm          TR Peak grad:   20.2 mmHg  MV Decel Time: 217 msec     TR Vmax:        225.00 cm/s  MV E velocity: 82.00 cm/s   Estimated RAP:  3.00 mmHg  MV A velocity: 105.50 cm/s  RVSP:           23.2 mmHg  MV E/A ratio:  0.78                              SHUNTS  Systemic VTI:  0.21 m                              Systemic Diam: 2.00 cm   CATH (01/2021) Coronary Findings  Diagnostic Dominance: Right Left Anterior Descending  Vessel is large.    Left Circumflex  Vessel is large.    Right Coronary Artery  Vessel is large.    Intervention   No interventions have been documented.   Coronary Diagrams  Diagnostic Dominance: Right  Intervention   Recent Radiology Findings:       Recent Lab Findings: Lab Results  Component Value Date   WBC 4.6 02/07/2021   HGB 12.2 02/13/2021   HCT 36.0 02/13/2021   PLT 301 02/07/2021   GLUCOSE 98 02/21/2021   ALT 48 02/01/2015   AST 28 02/01/2015   NA 138 02/21/2021   K 4.8 02/21/2021   CL 101 02/21/2021   CREATININE 1.15 (H) 02/21/2021   BUN 15 02/21/2021   CO2 25 02/21/2021      Assessment / Plan:     Pt with NYHA class I symptoms of bicuspid aortic stenosis with normal LV function and no CAD. She at this present level of symptoms feels that nothing needs to be done but understands that this will progress for her needing AVR in the future. She feels she is not able to recover from SAVR on her own and even understanding the recovery not as bad as she thought, still wants to monitor her symptoms moving forward and still be considered for TAVR since she feels that this is the best for her.    I have spent 60 min in review of the records, viewing studies and in face to face with patient and in coordination of future care    Eugenio Hoes 12/08/2022 8:56 AM

## 2023-01-01 ENCOUNTER — Encounter: Payer: Self-pay | Admitting: Cardiology

## 2023-01-07 ENCOUNTER — Telehealth: Payer: Self-pay

## 2023-01-07 NOTE — Telephone Encounter (Signed)
Message sent via MyChart  I was hoping my issues were blood pressure. I have issues with just every day tasks now. Very much out of breath and have stopped walking outside in morning . Housework makes me exhausted so much that I sit down during each task. Stopped shopping much as I have to have a cart to lean on. Should I still wait another month for doc visit?    Spoke to the patient, shortness of breath with exertion  has worsen in the past 3-4 weeks. Pt does not monitor her bp and HR. Pt stated she is an active person however she is concern because of her current symptoms.Ms. Sontag is scheduled with Dr. Bjorn Pippin on 6/20 @ 1540,  she will like advise if cardiac surgery is still recommended. Explained ED precautions, pt voiced understanding.

## 2023-01-08 ENCOUNTER — Encounter: Payer: Self-pay | Admitting: Physician Assistant

## 2023-01-08 ENCOUNTER — Ambulatory Visit: Payer: Medicare Other | Admitting: Cardiology

## 2023-01-08 ENCOUNTER — Ambulatory Visit: Payer: Medicare Other | Attending: Cardiology | Admitting: Physician Assistant

## 2023-01-08 ENCOUNTER — Ambulatory Visit (INDEPENDENT_AMBULATORY_CARE_PROVIDER_SITE_OTHER): Payer: Medicare Other

## 2023-01-08 VITALS — BP 174/76 | HR 47 | Ht 63.0 in | Wt 158.4 lb

## 2023-01-08 DIAGNOSIS — R0600 Dyspnea, unspecified: Secondary | ICD-10-CM | POA: Diagnosis not present

## 2023-01-08 DIAGNOSIS — I350 Nonrheumatic aortic (valve) stenosis: Secondary | ICD-10-CM

## 2023-01-08 DIAGNOSIS — I441 Atrioventricular block, second degree: Secondary | ICD-10-CM | POA: Diagnosis not present

## 2023-01-08 DIAGNOSIS — I10 Essential (primary) hypertension: Secondary | ICD-10-CM | POA: Diagnosis not present

## 2023-01-08 DIAGNOSIS — E785 Hyperlipidemia, unspecified: Secondary | ICD-10-CM

## 2023-01-08 NOTE — Progress Notes (Unsigned)
Enrolled patient for a 3 day Zio XT monitor to be mailed to patients home  [name] to read

## 2023-01-08 NOTE — Patient Instructions (Addendum)
Medication Instructions:   - Stop taking carvedilol  *If you need a refill on your cardiac medications before your next appointment, please call your pharmacy*   Lab Work:   - None   Testing/Procedures: - We have ordered you a 3 day Zio XT monitor.   Follow-Up: At [facility], you and your health needs are our priority.  As part of our continuing mission to provide you with exceptional heart care, we have created designated Provider Care Teams.  These Care Teams include your primary Cardiologist (physician) and Advanced Practice Providers (APPs -  Physician Assistants and Nurse Practitioners) who all work together to provide you with the care you need, when you need it.  We recommend signing up for the patient portal called "MyChart".  Sign up information is provided on this After Visit Summary.  MyChart is used to connect with patients for Virtual Visits (Telemedicine).  Patients are able to view lab/test results, encounter notes, upcoming appointments, etc.  Non-urgent messages can be sent to your provider as well.     To learn more about what you can do with MyChart, go to [email or web address].    Your next appointment:   You have been referred to EP (Electrophysiology)   Provider:   - Keep your follow-up appointment with Dr. Bjorn Pippin next month.   Other Instructions  - Use your Apple Watch to track your heart rate over night and send the findings through MyChart to [facility], [place].  - If you experience any passing out spells, please call 911 and got to the Emergency Room. Do not drive yourself.  ZIO XT- Long Term Monitor Instructions  Your physician has requested you wear a ZIO patch monitor for 3 days.  This is a single patch monitor. [employer] supplies one patch monitor per enrollment. Additional stickers are not available. Please do not apply patch if you will be having a Nuclear Stress Test,  Echocardiogram, Cardiac CT, MRI, or Chest Xray during the period you  would be wearing the  monitor. The patch cannot be worn during these tests. You cannot remove and re-apply the  ZIO XT patch monitor.  Your ZIO patch monitor will be mailed 3 day USPS to your address on file. It may take 3-5 days  to receive your monitor after you have been enrolled.  Once you have received your monitor, please review the enclosed instructions. Your monitor  has already been registered assigning a specific monitor serial # to you.  Billing and Patient Assistance Program Information  We have supplied [employer] with any of your insurance information on file for billing purposes. [employer] offers a sliding scale Patient Assistance Program for patients that do not have  insurance, or whose insurance does not completely cover the cost of the ZIO monitor.  You must apply for the Patient Assistance Program to qualify for this discounted rate.  To apply, please call [employer] at [phone], select option 4, select option 2, ask to apply for  Patient Assistance Program. Meredeth Ide will ask your household income, and how many people  are in your household. They will quote your out-of-pocket cost based on that information.  [employer] will also be able to set up a 54-month, interest-free payment plan if needed.  Applying the monitor   Shave hair from upper left chest.  Hold abrader disc by orange tab. Rub abrader in 40 strokes over the upper left chest as  indicated in your monitor instructions.  Clean area with 4 enclosed alcohol pads. Let  dry.  Apply patch as indicated in monitor instructions. Patch will be placed under collarbone on left  side of chest with arrow pointing upward.  Rub patch adhesive wings for 2 minutes. Remove white label marked "1". Remove the white  label marked "2". Rub patch adhesive wings for 2 additional minutes.  While looking in a mirror, press and release button in center of patch. A small green light will  flash 3-4 times. This will be your only indicator that  the monitor has been turned on.  Do not shower for the first 24 hours. You may shower after the first 24 hours.  Press the button if you feel a symptom. You will hear a small click. Record Date, Time and  Symptom in the Patient Logbook.  When you are ready to remove the patch, follow instructions on the last 2 pages of Patient  Logbook. Stick patch monitor onto the last page of Patient Logbook.  Place Patient Logbook in the blue and white box. Use locking tab on box and tape box closed  securely. The blue and white box has prepaid postage on it. Please place it in the mailbox as  soon as possible. Your physician should have your test results approximately 7 days after the  monitor has been mailed back to [facility].  Call [facility] Customer Care at [phone] if you have questions regarding  your ZIO XT patch monitor. Call them immediately if you see an orange light blinking on your  monitor.  If your monitor falls off in less than 4 days, contact our Monitor department at [phone].  If your monitor becomes loose or falls off after 4 days call [employer] at [phone] for  suggestions on securing your monitor

## 2023-01-08 NOTE — Progress Notes (Signed)
Cardiology Office Note:  .   Date:  01/10/2023  ID:  Tasha, Cox [birth year], MRN [id number] PCP: Aliene Beams, MD  [facility] Providers Cardiologist:  Little Ishikawa, MD     History of Present Illness: .   [name] is a 67 y.o. female with past medical history of breast cancer, hypertension, hyperlipidemia, GERD and a severe aortic stenosis.  She has a history of lumpectomy and chemotherapy/radiation therapy for breast cancer.  She has been followed by Dr. Bjorn Pippin since 2021 for moderate aortic stenosis.  Echocardiogram in 2023 showed aortic stenosis with mean gradient 19 mmHg.  Cardiac catheterization on 02/13/2021 showed no evidence of CAD.  Coronary CTA August 2022 showed tricuspid aortic valve with partial fusion of right and left coronary cusp.  Repeat echocardiogram in April 2024 showed EF 55 to 60%, mild MR, severe low-flow low gradient aortic stenosis with mean gradient 26.5 mmHg.  She was referred to Dr. Clifton James and was seen by the valvular clinic on 11/21/2022 at which time she had progressive fatigue and dyspnea with mild chest pressure.It was felt that she may benefit from traditional AVR rather than TAVR.  She was referred to CT surgery.  She was seen by Dr. Leafy Ro on 12/08/2022, she wished to continue to monitor her symptom and go through with AVR only when she becomes more symptomatic.   Patient presents today for evaluation of worsening dyspnea on exertion for the past few weeks.  Blood pressure was initially high at 174/76.  I repeated her blood pressure and it was 187/73.  Heart rate was 43.  However her blood pressure at home has been in the 110-150s range.  EKG shows she is in 2-1 AV block with heart rate of 40 bpm.  She says she always have some dizzy spell and that there has been no significant increase in the dizzy spell recently.  She denies any feeling of passing out.  She does have a few nights where she had to sit up because she was short  of breath laying down.  On physical exam, she appears to be euvolemic.  The patient was examined together with Dr. Antoine Poche, given lack of significant symptom, we decided to have her come off of carvedilol for now.  She will monitor her heart rate overnight on her Apple Watch and send [name] the data tomorrow.  We suspect she is going in and out of 2-1 AV block 4 weeks.  Her Apple Watch shows her heart rate ranges from 40-112 in the past month.  Dr. Antoine Poche recommended a 3-day ZIO monitor and refer to EP service.  She may still ended up getting a pacemaker in the future.  If despite normalization of her heart rate she is still short of breath with activity, then she will need to see cardiothoracic surgery service again to discuss possibility of aortic valve replacement.  ROS:   Patient complains of worsening dyspnea on exertion in the past few weeks  Studies Reviewed: Marland Kitchen   EKG Interpretation  Date/Time:  Thursday January 08 2023 16:26:16 EDT Ventricular Rate:  40 PR Interval:  174 QRS Duration: 148 QT Interval:  546 QTC Calculation: 444 R Axis:   -35 Text Interpretation: 2:1 AV block with LBBB Confirmed by Azalee Course [id number]) on 01/08/2023 4:29:02 PM    Cardiac Studies & Procedures   CARDIAC CATHETERIZATION  CARDIAC CATHETERIZATION 02/13/2021  Narrative No angiographic evidence of CAD Moderate to severe aortic stenosis. (Peak to peak gradient 25  mmHg, mean gradient 27 mmHg, AVA 1.08 cm2). This could represent low flow, low gradient aortic stenosis given echo findings (low dimensionless index, low AVA, low SVI). The valve was easily crossed with a J wire. Normal right and left heart pressures  Recommendations: Will review with her primary cardiologist Dr. Bjorn Pippin. Planning for cardiac CT with TAVR protocol. I suspect that she may benefit from AVR or TAVR but will await CT findings.  Findings Coronary Findings Diagnostic  Dominance: Right  Left Anterior Descending Vessel is large.  Left  Circumflex Vessel is large.  Right Coronary Artery Vessel is large.  Intervention  No interventions have been documented.     ECHOCARDIOGRAM  ECHOCARDIOGRAM COMPLETE 11/12/2022  Narrative ECHOCARDIOGRAM REPORT    Patient Name:   [name] Date of Exam: 11/12/2022 Medical Rec #:  [id number]          Height:       63.0 in Accession #:    [id number]         Weight:       153.0 lb Date of Birth:  March 01, 1956          BSA:          1.726 m Patient Age:    70 years           BP:           100/68 mmHg Patient Gender: F                  HR:           59 bpm. Exam Location:  [address]  Procedure: 2D Echo, 3D Echo, Cardiac Doppler, Color Doppler and Strain Analysis  Indications:    I35.0 Aortic Stenosis  History:        Patient has prior history of Echocardiogram examinations, most recent 06/30/2022. Aortic Valve Disease, Signs/Symptoms:Dizziness/Lightheadedness and Shortness of Breath; Risk Factors:Family History of Coronary Artery Disease and Dyslipidemia. *New Symptoms (within last 2-3 months) Dizziness and Chest Pressure History of Right Breast Cancer (2004) status post Lumpectomy, Chemotherapy and Radiation.  Sonographer:    Farrel Conners RDCS Referring Phys: Bjorn Pippin, CHRISTOPHER, L  IMPRESSIONS   1. Severe AS (mean gradient 26 mmHg; AVA 0.79 cm2; DI 0.25). 2. Left ventricular ejection fraction, by estimation, is 55 to 60%. The left ventricle has normal function. The left ventricle has no regional wall motion abnormalities. There is mild left ventricular hypertrophy. Left ventricular diastolic parameters are consistent with Grade I diastolic dysfunction (impaired relaxation). The average left ventricular global longitudinal strain is -17.6 %. The global longitudinal strain is normal. 3. Right ventricular systolic function is normal. The right ventricular size is normal. 4. The mitral valve is normal in structure. Mild mitral valve regurgitation. No evidence  of mitral stenosis. 5. The aortic valve is tricuspid. Aortic valve regurgitation is mild. Severe aortic valve stenosis. 6. The inferior vena cava is normal in size with greater than 50% respiratory variability, suggesting right atrial pressure of 3 mmHg.  Comparison(s): Aortic Stenosis, Prior mean gradient- .   FINDINGS Left Ventricle: Left ventricular ejection fraction, by estimation, is 55 to 60%. The left ventricle has normal function. The left ventricle has no regional wall motion abnormalities. The average left ventricular global longitudinal strain is -17.6 %. The global longitudinal strain is normal. The left ventricular internal cavity size was normal in size. There is mild left ventricular hypertrophy. Left ventricular diastolic parameters are consistent with Grade I diastolic dysfunction (impaired relaxation).  Right  Ventricle: The right ventricular size is normal. Right ventricular systolic function is normal.  Left Atrium: Left atrial size was normal in size.  Right Atrium: Right atrial size was normal in size.  Pericardium: There is no evidence of pericardial effusion.  Mitral Valve: The mitral valve is normal in structure. Mild mitral valve regurgitation. No evidence of mitral valve stenosis.  Tricuspid Valve: The tricuspid valve is normal in structure. Tricuspid valve regurgitation is mild . No evidence of tricuspid stenosis.  Aortic Valve: The aortic valve is tricuspid. Aortic valve regurgitation is mild. Aortic regurgitation PHT measures 579 msec. Severe aortic stenosis is present. Aortic valve mean gradient measures 26.5 mmHg. Aortic valve peak gradient measures 41.2 mmHg. Aortic valve area, by VTI measures 0.79 cm.  Pulmonic Valve: The pulmonic valve was normal in structure. Pulmonic valve regurgitation is trivial. No evidence of pulmonic stenosis.  Aorta: The aortic root is normal in size and structure.  Venous: The inferior vena cava is normal in size with  greater than 50% respiratory variability, suggesting right atrial pressure of 3 mmHg.  IAS/Shunts: No atrial level shunt detected by color flow Doppler.  Additional Comments: Severe AS (mean gradient 26 mmHg; AVA 0.79 cm2; DI 0.25).   LEFT VENTRICLE PLAX 2D LVIDd:         3.50 cm   Diastology LVIDs:         1.90 cm   LV e' medial:    5.76 cm/s LV PW:         0.80 cm   LV E/e' medial:  14.2 LV IVS:        0.90 cm   LV e' lateral:   8.38 cm/s LVOT diam:     2.00 cm   LV E/e' lateral: 9.8 LV SV:         66 LV SV Index:   38        2D Longitudinal Strain LVOT Area:     3.14 cm  2D Strain GLS (A2C):   -19.6 % 2D Strain GLS (A3C):   -23.2 % 2D Strain GLS (A4C):   -9.9 % 2D Strain GLS Avg:     -17.6 %  3D Volume EF: 3D EF:        68 % LV EDV:       120 ml LV ESV:       38 ml LV SV:        82 ml  RIGHT VENTRICLE RV Basal diam:  3.10 cm RV S prime:     9.14 cm/s TAPSE (M-mode): 1.4 cm RVSP:           23.2 mmHg  LEFT ATRIUM             Index        RIGHT ATRIUM           Index LA diam:        3.60 cm 2.09 cm/m   RA Pressure: 3.00 mmHg LA Vol (A2C):   55.5 ml 32.16 ml/m  RA Area:     11.30 cm LA Vol (A4C):   42.6 ml 24.69 ml/m  RA Volume:   26.30 ml  15.24 ml/m LA Biplane Vol: 50.1 ml 29.03 ml/m AORTIC VALVE AV Area (Vmax):    0.88 cm AV Area (Vmean):   0.84 cm AV Area (VTI):     0.79 cm AV Vmax:           321.00 cm/s AV Vmean:  226.800 cm/s AV VTI:            0.835 m AV Peak Grad:      41.2 mmHg AV Mean Grad:      26.5 mmHg LVOT Vmax:         89.96 cm/s LVOT Vmean:        60.520 cm/s LVOT VTI:          0.210 m LVOT/AV VTI ratio: 0.25 AI PHT:            579 msec  AORTA Ao Root diam: 2.70 cm Ao Asc diam:  3.40 cm  MITRAL VALVE                TRICUSPID VALVE MV Area (PHT)  cm          TR Peak grad:   20.2 mmHg MV Decel Time: 217 msec     TR Vmax:        225.00 cm/s MV E velocity: 82.00 cm/s   Estimated RAP:  3.00 mmHg MV A velocity: 105.50 cm/s   RVSP:           23.2 mmHg MV E/A ratio:  0.78 SHUNTS Systemic VTI:  0.21 m Systemic Diam: 2.00 cm  Olga Millers MD Electronically signed by Olga Millers MD Signature Date/Time: 11/12/2022/1:19:29 PM    Final     CT SCANS  CT CORONARY MORPH W/CTA COR W/SCORE 02/25/2021  Addendum 02/25/2021  5:14 PM ADDENDUM REPORT: 02/25/2021 17:12  CLINICAL DATA:  Severe Aortic Stenosis.  EXAM: Cardiac TAVR CT  TECHNIQUE: A non-contrast, gated CT scan was obtained with axial slices of 3 mm through the heart for aortic valve calcium scoring. A 90 kV retrospective, gated, contrast cardiac scan was obtained. Gantry rotation speed was 250 msecs and collimation was 0.6 mm. Nitroglycerin was not given. The 3D data set was reconstructed in 5% intervals of the 0-95% of the R-R cycle. Systolic and diastolic phases were analyzed on a dedicated workstation using MPR, MIP, and VRT modes. The patient received 100 cc of contrast.  FINDINGS: Image quality: Excellent.  Noise artifact is: Limited.  Valve Morphology: The aortic valve is tricuspid with partial fusion of the RCC/LCC. This possibly represents a forme fruste bicuspid valve. The leaflets are severely thickened with reduced opening in systole. There is mild to moderate calcification of the leaflets that is diffuse.  Aortic Valve Calcium score: 388  Phase assessed: 15%  Annular area: 354 mm2  Annular perimeter: 67.5 mm  Max diameter: 23.5 mm  Min diameter: 20.2 mm  Annular and subannular calcification: None.  Optimal coplanar projection: LAO 6 CAU 12  Coronary Artery Height above Annulus:  Left Main: 10.8 mm  Right Coronary: 12.8 mm  Sinus of Valsalva Measurements:  Non-coronary: 27 mm  Right-coronary: 26 mm  Left-coronary: 26 mm  Average: 26.7 mm  Sinus of Valsalva Height:  Non-coronary: 18.3 mm  Right-coronary: 16.5 mm  Left-coronary: 16.9 mm  Sinotubular Junction: 27 mm  Ascending Thoracic Aorta: 35  mm  Coronary Arteries: Normal coronary origin. Right dominance. The study was performed without use of NTG and is insufficient for plaque evaluation. Please refer to recent cardiac catheterization for coronary assessment.  Cardiac Morphology:  Right Atrium: Right atrial size is within normal limits.  Right Ventricle: The right ventricular cavity is within normal limits.  Left Atrium: Left atrial size is normal in size with no left atrial appendage filling defect.  Left Ventricle: The ventricular cavity size is within normal  limits. There are no stigmata of prior infarction. There is no abnormal filling defect. Normal left ventricular function, LVEF=64%. No regional wall motion abnormalities.  Pulmonary arteries: Normal in size without proximal filling defect.  Pulmonary veins: Normal pulmonary venous drainage.  Pericardium: Normal thickness with no significant effusion or calcium present.  Mitral Valve: The mitral valve is normal structure without significant calcification.  Extra-cardiac findings: See attached radiology report for non-cardiac structures.  IMPRESSION: 1. Tricuspid aortic valve with partial fusion of the RCC/LCC. This possibly represents a forme frust bicuspid aortic valve.  2. Annular measurements appropriate for 23 mm Edwards S3 TAVR (354 mm2).  3. No significant annular or subannular calcifications.  4. Shallow left main height (10.8 mm).  5. Optimal Fluoroscopic Angle for Delivery: LAO 6 CAU 12  Readlyn T. Flora Lipps, MD   Electronically Signed By: Lennie Odor On: 02/25/2021 17:12  Narrative EXAM: OVER-READ INTERPRETATION  CT CHEST  The following report is an over-read performed by radiologist Dr. Trudie Reed of [facility] on 02/25/2021. This over-read does not include interpretation of cardiac or coronary anatomy or pathology. The coronary calcium score/coronary CTA interpretation by the cardiologist is  attached.  COMPARISON:  Cardiac CT 02/15/2021.  FINDINGS: Extracardiac findings will be described separately under dictation for contemporaneously obtained CTA chest, abdomen and pelvis.  IMPRESSION: Please see separate dictation for contemporaneously obtained CTA chest, abdomen and pelvis dated 02/25/2021 for full description of relevant extracardiac findings.  Electronically Signed: By: Trudie Reed M.D. On: 02/25/2021 11:12   CT SCANS  CT CARDIAC SCORING (SELF PAY ONLY) 01/16/2021  Addendum 01/16/2021 11:43 AM ADDENDUM REPORT: 01/16/2021 11:41  CLINICAL DATA:  Risk stratification:  Year-old 41 Caucasian Female  EXAM: Coronary Calcium Score  TECHNIQUE: The patient was scanned on a [employer]. Axial non-contrast 3 mm slices were carried out through the heart. The data set was analyzed on a dedicated work station and scored using the Agatson method.  FINDINGS: Non-cardiac: See separate report from [facility].  Ascending Aorta: Normal caliber.  Aortic Valve: Non-severe aortic calcium with calcium score of 365  Pericardium: Normal.  Coronary arteries: Normal origins.  Coronary Calcium Score:  Left main: 0  Left anterior descending artery: 0  Left circumflex artery: 0  Right coronary artery: 0  Total: 0  Percentile: 1st for age, sex, and race matched control.  IMPRESSION: 1. Coronary calcium score of 0. This was 1st percentile for age, gender, and race matched controls.  2.  Non-severe range aortic calcium with calcium score of 365.  RECOMMENDATIONS:  The proposed cut-off value of 1,651 AU yielded a 93 % sensitivity and 75 % specificity in grading AS severity in patients with classical low-flow, low-gradient AS. Proposed different cut-off values to define severe AS for men and women as 2,065 AU and 1,274 AU, respectively. The joint European and American recommendations for the assessment of AS consider the aortic valve  calcium score as a continuum - a very high calcium score suggests severe AS and a low calcium score suggests severe AS is unlikely.  Sunday Shams, et al. 2017 ESC/EACTS Guidelines for the management of valvular heart disease. Eur Heart J 2017;38:2739-91.  Coronary artery calcium (CAC) score is a strong predictor of incident coronary heart disease (CHD) and provides predictive information beyond traditional risk factors. CAC scoring is reasonable to use in the decision to withhold, postpone, or initiate statin therapy in intermediate-risk or selected borderline-risk asymptomatic adults (age 36-75 years and  LDL-C >=70 to <190 mg/dL) who do not have diabetes or established atherosclerotic cardiovascular disease (ASCVD).* In intermediate-risk (10-year ASCVD risk >=7.5% to <20%) adults or selected borderline-risk (10-year ASCVD risk >=5% to <7.5%) adults in whom a CAC score is measured for the purpose of making a treatment decision the following recommendations have been made:  If CAC = 0, it is reasonable to withhold statin therapy and reassess in 5 to 10 years, as long as higher risk conditions are absent (diabetes mellitus, family history of premature CHD in first degree relatives (males <55 years; females <65 years), cigarette smoking, LDL >=190 mg/dL or other independent risk factors).  If CAC is 1 to 99, it is reasonable to initiate statin therapy for patients ?67 years of age.  If CAC is >=100 or >=75th percentile, it is reasonable to initiate statin therapy at any age.  Cardiology referral should be considered for patients with CAC scores =400 or >=75th percentile.  *2018 AHA/ACC/AACVPR/AAPA/ABC/ACPM/ADA/AGS/APhA/ASPC/NLA/PCNA Guideline on the Management of Blood Cholesterol: A Report of the American College of Cardiology/American Heart Association Task Force on Clinical Practice Guidelines. J Am Coll Cardiol. 2019;73(24):3168-3209.  Riley Lam,  MD   Electronically Signed By: Riley Lam MD On: 01/16/2021 11:41  Narrative EXAM: OVER-READ INTERPRETATION  CT CHEST  The following report is an over-read performed by radiologist Dr. Irish Lack of [facility] on 01/16/2021. This over-read does not include interpretation of cardiac or coronary anatomy or pathology. The coronary calcium score interpretation by the cardiologist is attached.  COMPARISON:  None.  FINDINGS: Vascular: Calcifications are seen at the level of the aortic valve.  Mediastinum/Nodes: No lymphadenopathy identified in the visualized mediastinum or hilar regions. There is a small hiatal hernia.  Lungs/Pleura: Anterior subpleural scarring and reticulation at the level of the inferior right upper lobe and right middle lobe likely relates to prior right breast radiation therapy. Visualized lungs show no evidence of pulmonary edema, consolidation, pneumothorax, nodule or pleural fluid.  Upper Abdomen: No acute abnormality.  Musculoskeletal: Clip or calcification seen in the right breast tissues. Bony structures are unremarkable.  IMPRESSION: 1. Calcifications of the aortic valve are consistent with valvular sclerosis/degenerative disease. 2. Small hiatal hernia. 3. Evidence of prior right breast surgery and radiation therapy.  Electronically Signed: By: Irish Lack M.D. On: 01/16/2021 09:14          Risk Assessment/Calculations:            Physical Exam:   VS:  BP (!) 174/76 (BP Location: Right Arm, Patient Position: Sitting, Cuff Size: Normal)   Pulse (!) 47   Ht 5\' 3"  (1.6 m)   Wt 158 lb 6.4 oz (71.8 kg)   SpO2 95%   BMI 28.06 kg/m    Wt Readings from Last 3 Encounters:  01/08/23 158 lb 6.4 oz (71.8 kg)  12/08/22 155 lb (70.3 kg)  11/21/22 152 lb 3.2 oz (69 kg)    GEN: Well nourished, well developed in no acute distress NECK: No JVD; No carotid bruits CARDIAC: Bradycardic, no murmurs, rubs,  gallops RESPIRATORY:  Clear to auscultation without rales, wheezing or rhonchi  ABDOMEN: Soft, non-tender, non-distended EXTREMITIES:  No edema; No deformity   ASSESSMENT AND PLAN: .    2:1 AV block: Seen on EKG.  Case discussed with Dr. Antoine Poche who also seen the patient in the clinic.  She has chronic intermittent dizziness, however this has been unchanged in the recent weeks.  She likely has been in this rhythm for  several weeks now.  We will discontinue her carvedilol.  She denies any recent passing out spell.  We will make a urgent referral to EP service.  Dr. Antoine Poche suspected she has been going in and out of 2-1 heart block for weeks now.  We will obtain a 3-day ZIO monitor to assess.  Given lack of significant symptom, we decided not to admit the patient right now as she likely will not get a pacemaker this weekend.  She is aware to go to the emergency room if she become more symptomatic  Dyspnea on exertion: Unclear if this is related to her current bradycardia versus severe aortic stenosis.  We have decided to stop her carvedilol to see if her heart rate will come up by itself.  If heart rate remains very low, she may end up needing a pacemaker.  If after pacemaker implantation, she still has dyspnea on exertion, then she will need to see CT surgery again to consider surgical aortic valve repair  Severe aortic stenosis: Patient was previously seen by Dr. Clifton James who recommended surgical aortic valve repair rather than TAVR.  She was subsequently seen by Dr. Leafy Ro who recommended continue to observe for symptoms.  If symptom worsens, will consider surgical aortic valve repair.  Was not sure if her current dyspnea on exertion is truly due to aortic stenosis versus 2-1 heart block with significant bradycardia.  Will try to treat bradycardia first, if symptom does not improve despite resolution of bradycardia, then she will need to see Dr. Leafy Ro again.  Hypertension: Blood pressure is  elevated today, however given bradycardia, I did not try to be too aggressive on lowering her blood pressure in in fear of causing passing out spell        Dispo: Urgent referral to EP service.  Signed, Azalee Course, PA

## 2023-01-12 DIAGNOSIS — I441 Atrioventricular block, second degree: Secondary | ICD-10-CM

## 2023-01-14 ENCOUNTER — Encounter: Payer: Self-pay | Admitting: Internal Medicine

## 2023-01-14 ENCOUNTER — Ambulatory Visit: Payer: Medicare Other | Attending: Internal Medicine | Admitting: Internal Medicine

## 2023-01-14 VITALS — BP 110/80 | Ht 63.5 in | Wt 156.8 lb

## 2023-01-14 DIAGNOSIS — I441 Atrioventricular block, second degree: Secondary | ICD-10-CM | POA: Diagnosis not present

## 2023-01-14 DIAGNOSIS — Z01812 Encounter for preprocedural laboratory examination: Secondary | ICD-10-CM | POA: Diagnosis not present

## 2023-01-14 LAB — CBC
Hematocrit: 38.8 % (ref 34.0–46.6)
Hemoglobin: 12.7 g/dL (ref 11.1–15.9)
MCH: 28 pg (ref 26.6–33.0)
MCHC: 32.7 g/dL (ref 31.5–35.7)
MCV: 86 fL (ref 79–97)
Platelets: 360 10*3/uL (ref 150–450)
RBC: 4.53 x10E6/uL (ref 3.77–5.28)
RDW: 15 % (ref 11.7–15.4)
WBC: 5.5 10*3/uL (ref 3.4–10.8)

## 2023-01-14 LAB — BASIC METABOLIC PANEL
BUN/Creatinine Ratio: 17 (ref 12–28)
BUN: 18 mg/dL (ref 8–27)
CO2: 25 mmol/L (ref 20–29)
Calcium: 10.1 mg/dL (ref 8.7–10.3)
Chloride: 106 mmol/L (ref 96–106)
Creatinine, Ser: 1.04 mg/dL — ABNORMAL HIGH (ref 0.57–1.00)
Glucose: 119 mg/dL — ABNORMAL HIGH (ref 70–99)
Potassium: 4.8 mmol/L (ref 3.5–5.2)
Sodium: 138 mmol/L (ref 134–144)
eGFR: 59 mL/min/{1.73_m2} — ABNORMAL LOW (ref >59)

## 2023-01-14 NOTE — H&P (View-Only) (Signed)
ELECTROPHYSIOLOGY CONSULT NOTE  Patient ID: [name], MRN: [id number], DOB/AGE: 1956-07-03 67 y.o. Admit date: (Not on file) Date of Consult: 01/14/2023  Primary Physician: Aliene Beams, MD Primary Cardiologist: CSch     [name] is a 67 y.o. female who is being seen today for the evaluation of bradycardia at the request of CSch.    HPI [name] is a 66 y.o. female referred because of bradycardia that was detected last week and an ECG that showed heart block.  She has significant severe low-flow aortic stenosis and has been seen by surgery in anticipation of surgical valve replacement given her age and her bicuspid valve.  Exercise tolerance has been quite good until about 3 weeks ago when she suddenly became quite short of breath and unable to do many of her normal activities without dyspnea and unable to exercise.  She also noted on her Apple Watch that her heart rates were in the 30s and 40s.  Has had some lightheadedness but no syncope.  No chest pain.  DATE TEST EF   7/22/ LHC   % No obstructive CAD  8/22 cMRI  64% Bicuspid AoV Calcium  12 8  /23 Echo   60-65 % AVA 0.8   4/24 Echo  55-60% Grad mean 26 Low Flow Severe AS   Date Cr K Hgb  8/22 1.15 4.8 12.2            Past Medical History:  Diagnosis Date   Aortic valve stenosis    Breast cancer (HCC) 2004   right breast; ER/PR+, Her2+   Cancer (HCC) 10/2002   right breast lumpectomy   CHF (congestive heart failure) (HCC)    GERD (gastroesophageal reflux disease)    HTN (hypertension)    Hyperlipidemia    Morton neuroma    Personal history of chemotherapy 11/19/2002   Personal history of radiation therapy 02/19/2003      Surgical History:  Past Surgical History:  Procedure Laterality Date   BREAST BIOPSY Right 2019   BREAST LUMPECTOMY Right    BREAST SURGERY  10/2002   lumpectomy / right side   RIGHT/LEFT HEART CATH AND CORONARY ANGIOGRAPHY N/A 02/13/2021    Procedure: RIGHT/LEFT HEART CATH AND CORONARY ANGIOGRAPHY;  Surgeon: Kathleene Hazel, MD;  Location: [facility];  Service: Cardiovascular;  Laterality: N/A;     Home Meds: Current Meds  Medication Sig   acetaminophen (TYLENOL) 325 MG tablet 1 tablet as needed   Calcium Carbonate (CALCI-CHEW PO) Take 1 tablet by mouth in the morning.   escitalopram (LEXAPRO) 5 MG tablet Take 5 mg by mouth in the morning.   losartan (COZAAR) 25 MG tablet TAKE 1 TABLET(25 MG) BY MOUTH DAILY   VITAMIN D, CHOLECALCIFEROL, PO Take by mouth daily.    Allergies: No Active Allergies  Social History   Socioeconomic History   Marital status: Divorced    Spouse name: Not on file   Number of children: 2   Years of education: Not on file   Highest education level: Not on file  Occupational History   Occupation: Retired-Customer Financial planner  Tobacco Use   Smoking status: Never   Smokeless tobacco: Never  Substance and Sexual Activity   Alcohol use: Yes    Alcohol/week: 1.0 - 2.0 standard drink of alcohol    Types: 1 - 2 Glasses of wine per week    Comment: occasional   Drug use: No  Sexual activity: Not on file  Other Topics Concern   Not on file  Social History Narrative   Not on file   Social Determinants of Health   Financial Resource Strain: Not on file  Food Insecurity: Not on file  Transportation Needs: Not on file  Physical Activity: Not on file  Stress: Not on file  Social Connections: Not on file  Intimate Partner Violence: Not on file     Family History  Problem Relation Age of Onset   Heart disease Mother    Diabetes Mother    Kidney failure Mother    Skin cancer Father        on ear; unknown type   Lung cancer Father 66       smoker; metastasis to bone and liver   Diabetes Brother    Heart disease Brother    Kidney failure Brother    Diabetes Maternal Aunt    Heart disease Maternal Aunt    Heart Problems Maternal Uncle    Diabetes Maternal Uncle     Skin cancer Paternal Uncle        on nose; unknown type   Heart attack Maternal Grandfather    Heart attack Paternal Grandmother    Heart attack Paternal Grandfather    Bladder Cancer Brother        dx. late 16s; smoker   Alzheimer's disease Paternal Aunt    Heart disease Paternal Aunt    Alzheimer's disease Paternal Uncle    Breast cancer Neg Hx      ROS:  Please see the history of present illness.     All other systems reviewed and negative.    Physical Exam: Blood pressure 110/80, height 5' 3.5" (1.613 m), weight 156 lb 12.8 oz (71.1 kg), SpO2 98 %. General: Well developed, well nourished female in no acute distress. Head: Normocephalic, atraumatic, sclera non-icteric, no xanthomas, nares are without discharge. EENT: normal  Lymph Nodes:  none Neck: Negative for carotid bruits. JVD not elevated. Back:without scoliosis kyphosis Lungs: Clear bilaterally to auscultation without wheezes, rales, or rhonchi. Breathing is unlabored. Heart: Slow  RRR with S1 S2 single . 3/6 systolic murmur . No rubs, or gallops appreciated. Abdomen: Soft, non-tender, non-distended with normoactive bowel sounds. No hepatomegaly. No rebound/guarding. No obvious abdominal masses. Msk:  Strength and tone appear normal for age. Extremities: No clubbing or cyanosis. No  edema.  Distal pedal pulses are 2+ and equal bilaterally. Skin: Warm and Dry Neuro: Alert and oriented X 3. CN III-XII intact Grossly normal sensory and motor function . Psych:  Responds to questions appropriately with a normal affect.        EKG: sinus  2-1 AVB LBBB  4/24 sinus rhythm with a narrow QRS  Assessment and Plan:   2:1 AVBlock with LBBB  AoStenosis -- severe    The patient has new onset heart block about 2 weeks ago validating her symptoms by the heart rate changes on her Apple Watch.  She will need pacing for relief of symptoms.  We also discussed the fact that this heart block could be progressive related to ongoing  injury to the conduction system and I offered her in hospital monitoring and pacing ASAP versus doing it more electively on my schedule which would be next Wednesday 7/3.  She has elected the latter in the advised if there are increasing symptoms to go to the emergency room.  She also asked and I have reached out to Dr. Leafy Ro and Dr.  Mcalhany regarding timing of her surgical valve replacement which currently based on the paucity of symptoms has been decided to be pursued later.  The benefits and risks were reviewed including but not limited to death,  perforation, infection, lead dislodgement and device malfunction.  The patient understands agrees and is willing to proceed.     Sherryl Manges

## 2023-01-14 NOTE — Progress Notes (Signed)
     ELECTROPHYSIOLOGY CONSULT NOTE  Patient ID: [name], MRN: [id number], DOB/AGE: 10/06/1955 67 y.o. Admit date: (Not on file) Date of Consult: 01/14/2023  Primary Physician: Hagler, Rachel, MD Primary Cardiologist: CSch     [name] is a 67 y.o. female who is being seen today for the evaluation of bradycardia at the request of CSch.    HPI [name] is a 67 y.o. female referred because of bradycardia that was detected last week and an ECG that showed heart block.  She has significant severe low-flow aortic stenosis and has been seen by surgery in anticipation of surgical valve replacement given her age and her bicuspid valve.  Exercise tolerance has been quite good until about 3 weeks ago when she suddenly became quite short of breath and unable to do many of her normal activities without dyspnea and unable to exercise.  She also noted on her Apple Watch that her heart rates were in the 30s and 40s.  Has had some lightheadedness but no syncope.  No chest pain.  DATE TEST EF   7/22/ LHC   % No obstructive CAD  8/22 cMRI  64% Bicuspid AoV Calcium  12 8  /23 Echo   60-65 % AVA 0.8   4/24 Echo  55-60% Grad mean 26 Low Flow Severe AS   Date Cr K Hgb  8/22 1.15 4.8 12.2            Past Medical History:  Diagnosis Date   Aortic valve stenosis    Breast cancer (HCC) 2004   right breast; ER/PR+, Her2+   Cancer (HCC) 10/2002   right breast lumpectomy   CHF (congestive heart failure) (HCC)    GERD (gastroesophageal reflux disease)    HTN (hypertension)    Hyperlipidemia    Morton neuroma    Personal history of chemotherapy 11/19/2002   Personal history of radiation therapy 02/19/2003      Surgical History:  Past Surgical History:  Procedure Laterality Date   BREAST BIOPSY Right 2019   BREAST LUMPECTOMY Right    BREAST SURGERY  10/2002   lumpectomy / right side   RIGHT/LEFT HEART CATH AND CORONARY ANGIOGRAPHY N/A 02/13/2021    Procedure: RIGHT/LEFT HEART CATH AND CORONARY ANGIOGRAPHY;  Surgeon: McAlhany, Christopher D, MD;  Location: [facility];  Service: Cardiovascular;  Laterality: N/A;     Home Meds: Current Meds  Medication Sig   acetaminophen (TYLENOL) 325 MG tablet 1 tablet as needed   Calcium Carbonate (CALCI-CHEW PO) Take 1 tablet by mouth in the morning.   escitalopram (LEXAPRO) 5 MG tablet Take 5 mg by mouth in the morning.   losartan (COZAAR) 25 MG tablet TAKE 1 TABLET(25 MG) BY MOUTH DAILY   VITAMIN D, CHOLECALCIFEROL, PO Take by mouth daily.    Allergies: No Active Allergies  Social History   Socioeconomic History   Marital status: Divorced    Spouse name: Not on file   Number of children: 2   Years of education: Not on file   Highest education level: Not on file  Occupational History   Occupation: Retired-Customer Service manager  Tobacco Use   Smoking status: Never   Smokeless tobacco: Never  Substance and Sexual Activity   Alcohol use: Yes    Alcohol/week: 1.0 - 2.0 standard drink of alcohol    Types: 1 - 2 Glasses of wine per week    Comment: occasional   Drug use: No     Sexual activity: Not on file  Other Topics Concern   Not on file  Social History Narrative   Not on file   Social Determinants of Health   Financial Resource Strain: Not on file  Food Insecurity: Not on file  Transportation Needs: Not on file  Physical Activity: Not on file  Stress: Not on file  Social Connections: Not on file  Intimate Partner Violence: Not on file     Family History  Problem Relation Age of Onset   Heart disease Mother    Diabetes Mother    Kidney failure Mother    Skin cancer Father        on ear; unknown type   Lung cancer Father 86       smoker; metastasis to bone and liver   Diabetes Brother    Heart disease Brother    Kidney failure Brother    Diabetes Maternal Aunt    Heart disease Maternal Aunt    Heart Problems Maternal Uncle    Diabetes Maternal Uncle     Skin cancer Paternal Uncle        on nose; unknown type   Heart attack Maternal Grandfather    Heart attack Paternal Grandmother    Heart attack Paternal Grandfather    Bladder Cancer Brother        dx. late 50s; smoker   Alzheimer's disease Paternal Aunt    Heart disease Paternal Aunt    Alzheimer's disease Paternal Uncle    Breast cancer Neg Hx      ROS:  Please see the history of present illness.     All other systems reviewed and negative.    Physical Exam: Blood pressure 110/80, height 5' 3.5" (1.613 m), weight 156 lb 12.8 oz (71.1 kg), SpO2 98 %. General: Well developed, well nourished female in no acute distress. Head: Normocephalic, atraumatic, sclera non-icteric, no xanthomas, nares are without discharge. EENT: normal  Lymph Nodes:  none Neck: Negative for carotid bruits. JVD not elevated. Back:without scoliosis kyphosis Lungs: Clear bilaterally to auscultation without wheezes, rales, or rhonchi. Breathing is unlabored. Heart: Slow  RRR with S1 S2 single . 3/6 systolic murmur . No rubs, or gallops appreciated. Abdomen: Soft, non-tender, non-distended with normoactive bowel sounds. No hepatomegaly. No rebound/guarding. No obvious abdominal masses. Msk:  Strength and tone appear normal for age. Extremities: No clubbing or cyanosis. No  edema.  Distal pedal pulses are 2+ and equal bilaterally. Skin: Warm and Dry Neuro: Alert and oriented X 3. CN III-XII intact Grossly normal sensory and motor function . Psych:  Responds to questions appropriately with a normal affect.        EKG: sinus  2-1 AVB LBBB  4/24 sinus rhythm with a narrow QRS  Assessment and Plan:   2:1 AVBlock with LBBB  AoStenosis -- severe    The patient has new onset heart block about 2 weeks ago validating her symptoms by the heart rate changes on her Apple Watch.  She will need pacing for relief of symptoms.  We also discussed the fact that this heart block could be progressive related to ongoing  injury to the conduction system and I offered her in hospital monitoring and pacing ASAP versus doing it more electively on my schedule which would be next Wednesday 7/3.  She has elected the latter in the advised if there are increasing symptoms to go to the emergency room.  She also asked and I have reached out to Dr. Weldner and Dr.   Mcalhany regarding timing of her surgical valve replacement which currently based on the paucity of symptoms has been decided to be pursued later.  The benefits and risks were reviewed including but not limited to death,  perforation, infection, lead dislodgement and device malfunction.  The patient understands agrees and is willing to proceed.     Dannis Deroche  

## 2023-01-14 NOTE — Patient Instructions (Signed)
Medication Instructions:  Your physician recommends that you continue on your current medications as directed. Please refer to the Current Medication list given to you today.  *If you need a refill on your cardiac medications before your next appointment, please call your pharmacy*   Lab Work:  BMET and CBC  If you have labs (blood work) drawn today and your tests are completely normal, you will receive your results only by: MyChart Message (if you have MyChart) OR A paper copy in the mail If you have any lab test that is abnormal or we need to change your treatment, we will call you to review the results.   Testing/Procedures: CRT or cardiac resynchronization therapy is a treatment used to correct heart failure. When you have heart failure your heart is weakened and doesn't pump as well as it should. This therapy may help reduce symptoms and improve the quality of life.  Please see the handout/brochure given to you today to get more information of the different options of therapy.  Your physician has recommended that you have a pacemaker inserted. A pacemaker is a small device that is placed under the skin of your chest or abdomen to help control abnormal heart rhythms. This device uses electrical pulses to prompt the heart to beat at a normal rate. Pacemakers are used to treat heart rhythms that are too slow. Wire (leads) are attached to the pacemaker that goes into the chambers of you heart. This is done in the hospital and usually requires and overnight stay. Please see the instruction sheet given to you today for more information.     Follow-Up: At [facility], you and your health needs are our priority.  As part of our continuing mission to provide you with exceptional heart care, we have created designated Provider Care Teams.  These Care Teams include your primary Cardiologist (physician) and Advanced Practice Providers (APPs -  Physician Assistants and Nurse Practitioners)  who all work together to provide you with the care you need, when you need it.  We recommend signing up for the patient portal called "MyChart".  Sign up information is provided on this After Visit Summary.  MyChart is used to connect with patients for Virtual Visits (Telemedicine).  Patients are able to view lab/test results, encounter notes, upcoming appointments, etc.  Non-urgent messages can be sent to your provider as well.   To learn more about what you can do with MyChart, go to [email or web address].    Your next appointment:   To be scheduled

## 2023-01-15 ENCOUNTER — Encounter: Payer: Self-pay | Admitting: Internal Medicine

## 2023-01-18 ENCOUNTER — Encounter: Payer: Self-pay | Admitting: Cardiology

## 2023-01-20 NOTE — Pre-Procedure Instructions (Signed)
Instructed patient on the following items: Arrival time 11:00  Nothing to eat or drink after midnight No meds AM of procedure Responsible person to drive you home and stay with you for 24 hrs Wash with special soap night before and morning of procedure  

## 2023-01-21 ENCOUNTER — Other Ambulatory Visit: Payer: Self-pay

## 2023-01-21 ENCOUNTER — Ambulatory Visit (HOSPITAL_COMMUNITY): Payer: Medicare Other

## 2023-01-21 ENCOUNTER — Ambulatory Visit (HOSPITAL_COMMUNITY)
Admission: RE | Admit: 2023-01-21 | Discharge: 2023-01-21 | Disposition: A | Discharge status: Home / Self Care | Payer: Medicare Other | Attending: Internal Medicine | Admitting: Internal Medicine

## 2023-01-21 ENCOUNTER — Encounter (HOSPITAL_COMMUNITY)
Admission: RE | Disposition: A | Discharge status: Home / Self Care | Payer: Self-pay | Source: Home / Self Care | Attending: Internal Medicine

## 2023-01-21 DIAGNOSIS — I350 Nonrheumatic aortic (valve) stenosis: Secondary | ICD-10-CM | POA: Diagnosis not present

## 2023-01-21 DIAGNOSIS — Z950 Presence of cardiac pacemaker: Secondary | ICD-10-CM

## 2023-01-21 DIAGNOSIS — I429 Cardiomyopathy, unspecified: Secondary | ICD-10-CM | POA: Insufficient documentation

## 2023-01-21 DIAGNOSIS — I441 Atrioventricular block, second degree: Secondary | ICD-10-CM | POA: Diagnosis not present

## 2023-01-21 DIAGNOSIS — I447 Left bundle-branch block, unspecified: Secondary | ICD-10-CM | POA: Diagnosis not present

## 2023-01-21 HISTORY — DX: Presence of cardiac pacemaker: Z95.0

## 2023-01-21 HISTORY — PX: PACEMAKER IMPLANT: EP1218

## 2023-01-21 SURGERY — PACEMAKER IMPLANT

## 2023-01-21 MED ORDER — CEFAZOLIN SODIUM-DEXTROSE 2-4 GM/100ML-% IV SOLN
INTRAVENOUS | Status: AC
  Administered 2023-01-21: 2 g via INTRAVENOUS
  Filled 2023-01-21: qty 100

## 2023-01-21 MED ORDER — MIDAZOLAM HCL 5 MG/5ML IJ SOLN
INTRAMUSCULAR | Status: DC | PRN
  Administered 2023-01-21: 1 mg via INTRAVENOUS
  Administered 2023-01-21: 2 mg via INTRAVENOUS
  Administered 2023-01-21: 1 mg via INTRAVENOUS

## 2023-01-21 MED ORDER — LIDOCAINE HCL (PF) 1 % IJ SOLN
INTRAMUSCULAR | Status: DC | PRN
  Administered 2023-01-21: 30 mL

## 2023-01-21 MED ORDER — SODIUM CHLORIDE 0.9 % IV SOLN: 80.0000 mg | INTRAVENOUS | Status: AC

## 2023-01-21 MED ORDER — SODIUM CHLORIDE 0.9 % IV SOLN: INTRAVENOUS | Status: DC

## 2023-01-21 MED ORDER — ONDANSETRON HCL 4 MG/2ML IJ SOLN: 4.0000 mg | Freq: Four times a day (QID) | INTRAMUSCULAR | Status: DC | PRN

## 2023-01-21 MED ORDER — CHLORHEXIDINE GLUCONATE 4 % EX SOLN: 4.0000 | Freq: Once | CUTANEOUS | Status: DC

## 2023-01-21 MED ORDER — HEPARIN (PORCINE) IN NACL 1000-0.9 UT/500ML-% IV SOLN
INTRAVENOUS | Status: DC | PRN
  Administered 2023-01-21: 500 mL

## 2023-01-21 MED ORDER — FENTANYL CITRATE (PF) 100 MCG/2ML IJ SOLN
INTRAMUSCULAR | Status: DC | PRN
  Administered 2023-01-21 (×3): 25 ug via INTRAVENOUS

## 2023-01-21 MED ORDER — CEFAZOLIN SODIUM-DEXTROSE 2-4 GM/100ML-% IV SOLN: 2.0000 g | INTRAVENOUS | Status: AC

## 2023-01-21 MED ORDER — DIAZEPAM 5 MG PO TABS
5.0000 mg | ORAL_TABLET | Freq: Once | ORAL | Status: AC
  Administered 2023-01-21: 5 mg via ORAL
  Filled 2023-01-21: qty 1

## 2023-01-21 MED ORDER — FENTANYL CITRATE (PF) 100 MCG/2ML IJ SOLN
INTRAMUSCULAR | Status: AC
  Filled 2023-01-21: qty 2

## 2023-01-21 MED ORDER — MIDAZOLAM HCL 5 MG/5ML IJ SOLN
INTRAMUSCULAR | Status: AC
  Filled 2023-01-21: qty 5

## 2023-01-21 MED ORDER — SODIUM CHLORIDE 0.9 % IV SOLN
INTRAVENOUS | Status: AC
  Administered 2023-01-21: 80 mg
  Filled 2023-01-21: qty 2

## 2023-01-21 MED ORDER — ACETAMINOPHEN 325 MG PO TABS: 325.0000 mg | ORAL_TABLET | ORAL | Status: DC | PRN

## 2023-01-21 SURGICAL SUPPLY — 14 items
CABLE SURGICAL S-101-97-12 (CABLE) ×2 IMPLANT
CATH RIGHTSITE C315HIS02 (CATHETERS) IMPLANT
IPG PACE AZUR XT DR MRI W1DR01 (Pacemaker) IMPLANT
LEAD CAPSURE NOVUS 5076-52CM (Lead) IMPLANT
LEAD SELECT SECURE 3830 383069 (Lead) IMPLANT
NDL ECHOTIP 22X10 (NEEDLE) IMPLANT
NEEDLE ECHOTIP 22X10 (NEEDLE) ×1 IMPLANT
PACE AZURE XT DR MRI W1DR01 (Pacemaker) ×1 IMPLANT
PAD DEFIB RADIO PHYSIO CONN (PAD) ×2 IMPLANT
SELECT SECURE 3830 383069 (Lead) ×1 IMPLANT
SHEATH 7FR PRELUDE SNAP 13 (SHEATH) IMPLANT
SLITTER 6232ADJ (MISCELLANEOUS) IMPLANT
TRAY PACEMAKER INSERTION (PACKS) ×2 IMPLANT
WIRE HI TORQ VERSACORE-J 145CM (WIRE) IMPLANT

## 2023-01-21 NOTE — Interval H&P Note (Signed)
History and Physical Interval Note:  01/21/2023 12:36 PM  [name]  has presented today for surgery, with the diagnosis of Cardiomyopathy and 2nd degree hear block.  The various methods of treatment have been discussed with the patient and family. After consideration of risks, benefits and other options for treatment, the patient has consented to  Procedure(s): BIV PACEMAKER INSERTION CRT-P (N/A) as a surgical intervention.  The patient's history has been reviewed, patient examined, no change in status, stable for surgery.  I have reviewed the patient's chart and labs.  Questions were answered to the patient's satisfaction.     Sherryl Manges

## 2023-01-21 NOTE — Discharge Instructions (Addendum)
Dermabond will come off in the next 10-14 days if not will remove at wound check appointment. Keep wound dry until tomorrow evening. No driving x 4 days. Wound check in office as scheduled    After Your Pacemaker   You have a Medtronic Pacemaker  ACTIVITY Do not lift your arm above shoulder height for 1 week after your procedure. After 7 days, you may progress as below.  You should remove your sling 24 hours after your procedure, unless otherwise instructed by your provider.     Wednesday January 28, 2023  Thursday January 29, 2023 Friday January 30, 2023 Saturday January 31, 2023   Do not lift, push, pull, or carry anything over 10 pounds with the affected arm until 6 weeks (Wednesday March 04, 2023 ) after your procedure.   You may drive AFTER your wound check, unless you have been told otherwise by your provider.   Ask your healthcare provider when you can go back to work   INCISION/Dressing  If large square, outer bandage is left in place, this can be removed after 24 hours from your procedure. Do not remove steri-strips or glue as below.   Monitor your Pacemaker site for redness, swelling, and drainage. Call the device clinic at [phone] if you experience these symptoms or fever/chills.  If your incision is sealed with Steri-strips or staples, you may shower 7 days after your procedure or when told by your provider. Do not remove the steri-strips or let the shower hit directly on your site. You may wash around your site with soap and water.    If you were discharged in a sling, please do not wear this during the day more than 48 hours after your surgery unless otherwise instructed. This may increase the risk of stiffness and soreness in your shoulder.   Avoid lotions, ointments, or perfumes over your incision until it is well-healed.  You may use a hot tub or a pool AFTER your wound check appointment if the incision is completely closed.  Pacemaker Alerts:  Some alerts are  vibratory and others beep. These are NOT emergencies. Please call our office to let us know. If this occurs at night or on weekends, it can wait until the next business day. Send a remote transmission.  If your device is capable of reading fluid status (for heart failure), you will be offered monthly monitoring to review this with you.   DEVICE MANAGEMENT Remote monitoring is used to monitor your pacemaker from home. This monitoring is scheduled every 91 days by our office. It allows [name] to keep an eye on the functioning of your device to ensure it is working properly. You will routinely see your Electrophysiologist annually (more often if necessary).   You should receive your ID card for your new device in 4-8 weeks. Keep this card with you at all times once received. Consider wearing a medical alert bracelet or necklace.  Your Pacemaker may be MRI compatible. This will be discussed at your next office visit/wound check.  You should avoid contact with strong electric or magnetic fields.   Do not use amateur (ham) radio equipment or electric (arc) welding torches. MP3 player headphones with magnets should not be used. Some devices are safe to use if held at least 12 inches (30 cm) from your Pacemaker. These include power tools, lawn mowers, and speakers. If you are unsure if something is safe to use, ask your health care provider.  When using your cell phone, hold it  to the ear that is on the opposite side from the Pacemaker. Do not leave your cell phone in a pocket over the Pacemaker.  You may safely use electric blankets, heating pads, computers, and microwave ovens.  Call the office right away if: You have chest pain. You feel more short of breath than you have felt before. You feel more light-headed than you have felt before. Your incision starts to open up.  This information is not intended to replace advice given to you by your health care provider. Make sure you discuss any questions you  have with your health care provider.

## 2023-01-23 ENCOUNTER — Encounter (HOSPITAL_COMMUNITY): Payer: Self-pay | Admitting: Internal Medicine

## 2023-01-23 NOTE — Progress Notes (Signed)
Pt states when she removed outer chest dressing she had a blister at bottom of dressing. She placed abx oint on site and covered w/ bandaid. Informed her not to use abx oint and to call Dr Graciela Husbands office if any further problems, pt verbalized understanding.

## 2023-01-25 ENCOUNTER — Encounter: Payer: Self-pay | Admitting: Internal Medicine

## 2023-01-30 DIAGNOSIS — I441 Atrioventricular block, second degree: Secondary | ICD-10-CM | POA: Diagnosis not present

## 2023-02-04 DIAGNOSIS — I5042 Chronic combined systolic (congestive) and diastolic (congestive) heart failure: Secondary | ICD-10-CM | POA: Diagnosis not present

## 2023-02-04 DIAGNOSIS — E7800 Pure hypercholesterolemia, unspecified: Secondary | ICD-10-CM | POA: Diagnosis not present

## 2023-02-04 DIAGNOSIS — Z0000 Encounter for general adult medical examination without abnormal findings: Secondary | ICD-10-CM | POA: Diagnosis not present

## 2023-02-04 DIAGNOSIS — R7303 Prediabetes: Secondary | ICD-10-CM | POA: Diagnosis not present

## 2023-02-04 DIAGNOSIS — F321 Major depressive disorder, single episode, moderate: Secondary | ICD-10-CM | POA: Diagnosis not present

## 2023-02-04 DIAGNOSIS — I10 Essential (primary) hypertension: Secondary | ICD-10-CM | POA: Diagnosis not present

## 2023-02-04 DIAGNOSIS — E559 Vitamin D deficiency, unspecified: Secondary | ICD-10-CM | POA: Diagnosis not present

## 2023-02-05 ENCOUNTER — Ambulatory Visit: Payer: Medicare Other | Attending: Cardiology

## 2023-02-05 DIAGNOSIS — I441 Atrioventricular block, second degree: Secondary | ICD-10-CM

## 2023-02-05 LAB — CUP PACEART INCLINIC DEVICE CHECK
Battery Remaining Longevity: 143 mo
Battery Voltage: 3.19 V
Brady Statistic AP VP Percent: 2.12 %
Brady Statistic AP VS Percent: 0.05 %
Brady Statistic AS VP Percent: 97.63 %
Brady Statistic AS VS Percent: 0.19 %
Brady Statistic RA Percent Paced: 2.26 %
Brady Statistic RV Percent Paced: 99.75 %
Date Time Interrogation Session: 20240718090654
Implantable Lead Connection Status: 753985
Implantable Lead Connection Status: 753985
Implantable Lead Implant Date: 20240703
Implantable Lead Implant Date: 20240703
Implantable Lead Location: 753859
Implantable Lead Location: 753860
Implantable Lead Model: 3830
Implantable Lead Model: 5076
Implantable Pulse Generator Implant Date: 20240703
Lead Channel Impedance Value: 418 Ohm
Lead Channel Impedance Value: 456 Ohm
Lead Channel Impedance Value: 646 Ohm
Lead Channel Impedance Value: 665 Ohm
Lead Channel Pacing Threshold Amplitude: 0.875 V
Lead Channel Pacing Threshold Amplitude: 0.875 V
Lead Channel Pacing Threshold Pulse Width: 0.4 ms
Lead Channel Pacing Threshold Pulse Width: 0.4 ms
Lead Channel Sensing Intrinsic Amplitude: 14.25 mV
Lead Channel Sensing Intrinsic Amplitude: 15.25 mV
Lead Channel Sensing Intrinsic Amplitude: 2.875 mV
Lead Channel Sensing Intrinsic Amplitude: 2.875 mV
Lead Channel Setting Pacing Amplitude: 3.5 V
Lead Channel Setting Pacing Amplitude: 3.5 V
Lead Channel Setting Pacing Pulse Width: 0.4 ms
Lead Channel Setting Sensing Sensitivity: 1.2 mV
Zone Setting Status: 755011

## 2023-02-05 NOTE — Patient Instructions (Signed)

## 2023-02-05 NOTE — Progress Notes (Signed)
Wound check appointment. Steri-strips removed. Wound without redness or edema. Incision edges approximated, wound well healed. Normal device function. Thresholds, sensing, and impedances consistent with implant measurements. Device programmed at 3.5V/auto capture programmed on for extra safety margin until 3 month visit. Histogram distribution appropriate for patient and level of activity. Brief episodes of atrial tachycardia.  One brief HVR episode noted. Patient educated about wound care, arm mobility, lifting restrictions. ROV in 3 months with implanting physician.

## 2023-02-06 DIAGNOSIS — G5762 Lesion of plantar nerve, left lower limb: Secondary | ICD-10-CM | POA: Diagnosis not present

## 2023-02-13 ENCOUNTER — Ambulatory Visit: Payer: Medicare Other | Attending: Cardiology | Admitting: Cardiology

## 2023-02-13 ENCOUNTER — Encounter: Payer: Self-pay | Admitting: Cardiology

## 2023-02-13 VITALS — BP 128/78 | HR 63 | Ht 63.0 in | Wt 157.8 lb

## 2023-02-13 DIAGNOSIS — I441 Atrioventricular block, second degree: Secondary | ICD-10-CM | POA: Diagnosis not present

## 2023-02-13 DIAGNOSIS — I5042 Chronic combined systolic (congestive) and diastolic (congestive) heart failure: Secondary | ICD-10-CM

## 2023-02-13 DIAGNOSIS — I10 Essential (primary) hypertension: Secondary | ICD-10-CM | POA: Diagnosis not present

## 2023-02-13 DIAGNOSIS — I350 Nonrheumatic aortic (valve) stenosis: Secondary | ICD-10-CM | POA: Diagnosis not present

## 2023-02-13 DIAGNOSIS — E785 Hyperlipidemia, unspecified: Secondary | ICD-10-CM

## 2023-02-13 NOTE — Patient Instructions (Signed)
Medication Instructions:  Your physician recommends that you continue on your current medications as directed. Please refer to the Current Medication list given to you today.  *If you need a refill on your cardiac medications before your next appointment, please call your pharmacy*     Follow-Up: At [facility], you and your health needs are our priority.  As part of our continuing mission to provide you with exceptional heart care, we have created designated Provider Care Teams.  These Care Teams include your primary Cardiologist (physician) and Advanced Practice Providers (APPs -  Physician Assistants and Nurse Practitioners) who all work together to provide you with the care you need, when you need it.      Your next appointment:   3 month(s)  Provider:   Little Ishikawa, MD

## 2023-02-13 NOTE — Progress Notes (Signed)
Cardiology Office Note:    Date:  02/13/2023   ID:  [name] [birth year], MRN [id number]  PCP:  Tasha Beams, MD  Cardiologist:  Tasha Ishikawa, MD  Electrophysiologist:  None   Referring MD: Tasha Beams, MD   Chief Complaint  Patient presents with   Aortic Stenosis     History of Present Illness:    [name] is a 67 y.o. female with a hx of breast cancer, hyperlipidemia who presents for follow-up.  She was referred by [name] for evaluation of heart murmur, initially seen on 10/24/2019.  She reports that she followed at [facility] as a child, was told she had an issue with her aortic valve.  She had a heart catheterization done in middle school.  States that she followed with cardiology there until college, has not been seen for 40 years.  Reports that she was told that everything resolved and she did not need further cardiology follow-up.  She denies any chest pain or dyspnea.  States that she walks about twice per week, usually from 45 minutes to an hour.  Prior to pandemic was walking for 1 to 2 hours.  She denies any lightheadedness, syncope, or palpitations.  Does report occasional lower extremity edema.  No history of smoking.  Family history includes mother died of MI at age 64, and brother had CABG/AVR and died at age 61.  TTE 2007/02/16 showed LVEF 55%, mild LV dilatation, mild MR, mild AI.  TTE on 11/14/2019 showed LVEF 65 to 70%, indeterminate diastolic filling, normal RV systolic function, mild aortic stenosis, mild aortic regurgitation, mild mitral regurgitation.  Echocardiogram on 01/16/2021 showed EF 40 to 45%, mild RV dysfunction, moderate to severe aortic stenosis (V-max 3.0 m/s, mean gradient 20 mmHg, AVA 0.8 cm, DI 0.24). LHC/RHC on 02/13/21 showed no evidence of CAD, moderate to severe aortic stenosis (mean gradient 27 mmHg, AVA 1.1 cm), RA 1, RV 19/0, PA 20/8/12, PW CP 8, LVEDP 8, CI 3.2.  CTA shows tricuspid aortic valve with partial  fusion of RCC/LCC, possibly a forme fruste bicuspid aortic valve.  Echocardiogram on 07/16/2021 showed EF had improved to 65 to 70% and aortic stenosis was mild to moderate (V-max 2.8 m/s, mean gradient 18 mmHg, AVA 1.3 cm).  Echocardiogram 06/2022 showed an EF 60 to 65%, normal RV function, moderate to severe aortic stenosis (Vmax 3.0 m/s, MG 19 mmHg, AVA 0.8 cm^2, DI 0.27).  Echocardiogram 10/2022 showed severe aortic stenosis (mean gradient 26 mmHg, AVA 0.8 cm, DI 0.25), EF 55 to 60%, normal RV function.  She was found to have heart block and underwent Zio patch x 3 days which showed 2030 episodes of AV block (second-degree Mobitz 2, high-grade and third-degree). Had third-degree AV block with heart rate down to 36 bpm.  Underwent PPM placement with [name] on 01/21/2023.  Since last clinic visit, she reports she feels improved since her recent PPM placement.  Does report she gets short of breath with walking up stairs, which is new for her.  She denies any chest pain.  Reports occasional dizziness though has improved since her pacemaker.  Past Medical History:  Diagnosis Date   Aortic valve stenosis    Breast cancer [facility]) 2004   right breast; ER/PR+, Her2+   Cancer (HCC) 10/2002   right breast lumpectomy   CHF (congestive heart failure) (HCC)    GERD (gastroesophageal reflux disease)    HTN (hypertension)    Hyperlipidemia    Gwenevere Abbot  neuroma    Personal history of chemotherapy 11/19/2002   Personal history of radiation therapy 02/19/2003    Past Surgical History:  Procedure Laterality Date   BREAST BIOPSY Right 2019   BREAST LUMPECTOMY Right    BREAST SURGERY  10/2002   lumpectomy / right side   PACEMAKER IMPLANT N/A 01/21/2023   Procedure: PACEMAKER IMPLANT;  Surgeon: Tasha Salvia, MD;  Location: [facility] INVASIVE CV LAB;  Service: Cardiovascular;  Laterality: N/A;   RIGHT/LEFT HEART CATH AND CORONARY ANGIOGRAPHY N/A 02/13/2021   Procedure: RIGHT/LEFT HEART CATH AND CORONARY ANGIOGRAPHY;   Surgeon: Tasha Hazel, MD;  Location: [facility] INVASIVE CV LAB;  Service: Cardiovascular;  Laterality: N/A;    Current Medications: Current Meds  Medication Sig   acetaminophen (TYLENOL) 650 MG CR tablet Take 650 mg by mouth every 8 (eight) hours as needed for pain.   Calcium Carbonate (CALCI-CHEW PO) Take 1 tablet by mouth in the morning.   calcium carbonate (TUMS EX) 750 MG chewable tablet Chew 2 tablets by mouth daily as needed for heartburn.   cholecalciferol (VITAMIN D3) 25 MCG (1000 UNIT) tablet Take 1,000 Units by mouth daily.   escitalopram (LEXAPRO) 10 MG tablet Take 10 mg by mouth daily.   losartan (COZAAR) 25 MG tablet TAKE 1 TABLET(25 MG) BY MOUTH DAILY   Polyethyl Glycol-Propyl Glycol (LUBRICATING EYE DROPS OP) Place 1 drop into both eyes daily as needed (dry eyes).   rosuvastatin (CRESTOR) 10 MG tablet Take 10 mg by mouth daily.     Allergies:   Patient has no known allergies.   Social History   Socioeconomic History   Marital status: Divorced    Spouse name: Not on file   Number of children: 2   Years of education: Not on file   Highest education level: Not on file  Occupational History   Occupation: Retired-Customer Financial planner  Tobacco Use   Smoking status: Never   Smokeless tobacco: Never  Substance and Sexual Activity   Alcohol use: Yes    Alcohol/week: 1.0 - 2.0 standard drink of alcohol    Types: 1 - 2 Glasses of wine per week    Comment: occasional   Drug use: No   Sexual activity: Not on file  Other Topics Concern   Not on file  Social History Narrative   Not on file   Social Determinants of Health   Financial Resource Strain: Not on file  Food Insecurity: Not on file  Transportation Needs: Not on file  Physical Activity: Not on file  Stress: Not on file (09/01/2019)  Social Connections: Not on file     Family History: The patient's family history includes Alzheimer's disease in her paternal aunt and paternal uncle; Bladder Cancer  in her brother; Diabetes in her brother, maternal aunt, maternal uncle, and mother; Heart Problems in her maternal uncle; Heart attack in her maternal grandfather, paternal grandfather, and paternal grandmother; Heart disease in her brother, maternal aunt, mother, and paternal aunt; Kidney failure in her brother and mother; Lung cancer (age of onset: 11) in her father; Skin cancer in her father and paternal uncle. There is no history of Breast cancer.  ROS:   Please see the history of present illness.  All other systems reviewed and are negative.  EKGs/Labs/Other Studies Reviewed:    The following studies were reviewed today:   EKG:   02/13/23: A-sesned, V-paced, rate 63 11/04/2022: Normal sinus rhythm, rate 71, LVH 05/13/22: Normal sinus rhythm, rate 82, LVH, Q  waves in V1-3 08/06/21: Normal sinus rhythm, rate 70, LVH, left axis deviation 07/22:  NSR, rate 69, LVH 11/27/2020 (PCP Office): NSR, rate 69 bpm, LBBB 11/29/2019: EKG was not ordered.   10/24/2019: normal sinus rhythm, rate 73, Q waves in V1/2, no ST/T abnormalities  TTE 11/14/19:  1. Left ventricular ejection fraction, by estimation, is 65 to 70%. The  left ventricle has normal function. The left ventricle has no regional  wall motion abnormalities. Indeterminate diastolic filling due to E-A  fusion.   2. Right ventricular systolic function is normal. The right ventricular  size is normal. There is normal pulmonary artery systolic pressure.   3. The mitral valve is normal in structure. Mild mitral valve  regurgitation.   4. AV is thickened, calcified Peak and mean gradients through the valve  are 29 and 17 mm Hg respectively consistent with mild aortic stenosis..  The aortic valve is tricuspid. Aortic valve regurgitation is mild. Mild  aortic valve stenosis.   5. The inferior vena cava is normal in size with greater than 50%  respiratory variability, suggesting right atrial pressure of 3 mmHg.   Recent Labs: 01/14/2023: BUN  18; Creatinine, Ser 1.04; Hemoglobin 12.7; Platelets 360; Potassium 4.8; Sodium 138  Recent Lipid Panel No results found for: "CHOL", "TRIG", "HDL", "CHOLHDL", "VLDL", "LDLCALC", "LDLDIRECT"  Physical Exam:    VS:  BP 128/78 (BP Location: Left Arm, Patient Position: Sitting, Cuff Size: Normal)   Pulse 63   Ht 5\' 3"  (1.6 m)   Wt 157 lb 12.8 oz (71.6 kg)   SpO2 97%   BMI 27.95 kg/m     Wt Readings from Last 3 Encounters:  02/13/23 157 lb 12.8 oz (71.6 kg)  01/21/23 154 lb (69.9 kg)  01/14/23 156 lb 12.8 oz (71.1 kg)     GEN:  Well nourished, well developed in no acute distress HEENT: Normal NECK: No JVD CARDIAC: RRR,  2/6 systolic heart murmur loudest at the RUSB RESPIRATORY:  Clear to auscultation without rales, wheezing or rhonchi  ABDOMEN: Soft, non-tender, non-distended MUSCULOSKELETAL:  No edema; No deformity  SKIN: Warm and dry NEUROLOGIC:  Alert and oriented x 3 PSYCHIATRIC:  Normal affect   ASSESSMENT:    1. Severe aortic stenosis   2. Heart block AV second degree   3. Chronic combined systolic and diastolic heart failure (HCC)   4. Essential hypertension   5. Hyperlipidemia, unspecified hyperlipidemia type      PLAN:    Aortic stenosis: Echocardiogram on 01/16/2021 showed EF 40 to 45%, mild RV dysfunction, moderate to severe aortic stenosis (V-max 3.0 m/s, mean gradient 20 mmHg, AVA 0.8 cm, DI 0.24).  AV calcium score only 365 on 01/16/21, argues against severe AS.  LHC/RHC on 02/13/21 showed no evidence of CAD, moderate to severe aortic stenosis (mean gradient 27 mmHg, AVA 1.1 cm), RA 1, RV 19/0, PA 20/8/12, PW CP 8, LVEDP 8, CI 3.2.  CTA shows tricuspid aortic valve with partial fusion of RCC/LCC, possibly a forme fruste bicuspid aortic valve.  Echocardiogram on 07/16/2021 showed EF had improved to 65 to 70% and aortic stenosis was mild to moderate (V-max 2.8 m/s, mean gradient 18 mmHg, AVA 1.3 cm).  Echocardiogram 06/2022 showed an EF 60 to 65%, normal RV  function, moderate to severe aortic stenosis (Vmax 3.0 m/s, MG 19 mmHg, AVA 0.8 cm^2, DI 0.27).  Echocardiogram 10/2022 showed severe aortic stenosis (mean gradient 26 mmHg, AVA 0.8 cm, DI 0.25), EF 55 to 60%, normal RV function.  -  She was seen by Dr. Clifton James in structural heart clinic and Dr. Leafy Ro in cardiac surgery.  SAVR recommended but decision was to hold off as she was minimally symptomatic.  She is now reporting she is having dyspnea on exertion, reports short of breath walking up a flight of stairs, which is new for her.  In addition she developed complete heart block and underwent PPM.  Will discuss with Dr. Leafy Ro about proceeding with AVR  Chronic combined systolic and diastolic heart failure:  new diagnosis.  Echocardiogram on 01/16/2021 showed EF 40 to 45%, mild RV dysfunction, moderate to severe aortic stenosis (V-max 3.0 m/s, mean gradient 20 mmHg, AVA 0.8 cm, DI 0.24).  LHC/RHC on 02/13/21 showed no evidence of CAD, moderate to severe aortic stenosis (mean gradient 27 mmHg, AVA 1.1 cm), RA 1, RV 19/0, PA 20/8/12, PW CP 8, LVEDP 8, CI 3.2.  CTA shows tricuspid aortic valve with partial fusion of RCC/LCC, possibly a forme fruste bicuspid aortic valve.  Echocardiogram on 07/16/2021 showed EF had improved to 65 to 70% and aortic stenosis was mild to moderate (V-max 2.8 m/s, mean gradient 18 mmHg, AVA 1.3 cm). -Coreg was discontinued when having bradycardia -Continue losartan 25 mg daily -Discussed cardiac MRI for work-up nonischemic cardiomyopathy, but patient reports she has severe claustrophobia and is unable to tolerate MRI.  Will hold off given improvement in systolic function  Heart block: She was found to have heart block and underwent Zio patch x 3 days which showed 2030 episodes of AV block (second-degree Mobitz 2, high-grade and third-degree). Had third-degree AV block with heart rate down to 36 bpm.  Suspect due to her severe calcific aortic stenosis.  Underwent PPM placement with  [name] on 01/21/2023.  Hyperlipidemia: LDL 148 on 11/29/21.  10 year ASCVD risk score 7%, borderline for meeting indication to start statin.  Caclium score 0 on 01/16/21  Hypertension: On losartan, appears controlled   RTC in 3 months   Medication Adjustments/Labs and Tests Ordered: Current medicines are reviewed at length with the patient today.  Concerns regarding medicines are outlined above.  Orders Placed This Encounter  Procedures   EKG 12-Lead     No orders of the defined types were placed in this encounter.      Patient Instructions  Medication Instructions:  Your physician recommends that you continue on your current medications as directed. Please refer to the Current Medication list given to you today.  *If you need a refill on your cardiac medications before your next appointment, please call your pharmacy*     Follow-Up: At [facility], you and your health needs are our priority.  As part of our continuing mission to provide you with exceptional heart care, we have created designated Provider Care Teams.  These Care Teams include your primary Cardiologist (physician) and Advanced Practice Providers (APPs -  Physician Assistants and Nurse Practitioners) who all work together to provide you with the care you need, when you need it.      Your next appointment:   3 month(s)  Provider:   Little Ishikawa, MD      Signed, Tasha Ishikawa, MD  02/13/2023 9:41 AM    [facility]

## 2023-02-18 NOTE — Telephone Encounter (Signed)
Spoke with patient.  I had messaged Dr Leafy Ro and he was in agreement with proceeding with AVR.  Recommended Ms Summar call Dr Karolee Ohs office to discuss next steps

## 2023-03-05 ENCOUNTER — Encounter: Payer: Self-pay | Admitting: Cardiovascular Disease

## 2023-03-05 ENCOUNTER — Ambulatory Visit: Payer: Medicare Other | Attending: Cardiovascular Disease | Admitting: Cardiovascular Disease

## 2023-03-05 VITALS — BP 114/70 | HR 78 | Ht 63.0 in | Wt 158.0 lb

## 2023-03-05 DIAGNOSIS — I350 Nonrheumatic aortic (valve) stenosis: Secondary | ICD-10-CM

## 2023-03-05 NOTE — Progress Notes (Signed)
[facility] Consult Note  No chief complaint on file.  History of Present Illness: 67 yo female with history of breast cancer, hyperlipidemia, HTN, high grade AV block s/p pacemaker placement and severe aortic stenosis who is here today for follow up. I saw her as a new consult in May 2024 for further discussion regarding her aortic stenosis and AVR vs TAVR. She has had breast cancer treated with lumpectomy, chemotherapy and radiation therapy. She has had a heart murmur since she was a child. She has been followed in our office since 2021 by Dr. Bjorn Pippin for moderate aortic stenosis. Echo in 2023 with moderate aortic stenosis with mean gradient 19 mmHg, AVA 0.8 cm2 and DI 0.27. Cardiac catheterization in July 2022 with no evidence of CAD. Coronary CTA August 2022 with tricuspid aortic valve with partial fusion of the right and left coronary cusps (Possible forme frust bicuspid AV). AV calcium score of 388. AV annular area of 354 mm2. Shallow left main height at 10.8 mm. Echo 11/12/22 with LVEF=55-60%. Mild mitral regurgitation. Severe paradoxical low flow/low gradient aortic stenosis with mean gradient 26.5 mmHg, peak gradient 41 mmHg, AVA 0.79 cm2, DI 0.25 SVI 38. When I met her in May 2024 she described some dyspnea and fatigue. I referred her to see Dr. Leafy Ro with CT surgery. She told him that she would not consider surgical AVR and wished to follow her AS for now. She was found to have high grade AV block in June 2024 and had a permanent pacemaker implanted.   She is here today for follow up. She has been having more profound dyspnea on exertion and fatigue. She is now dyspneic when walking to the mailbox and with any moderate exertion. She is now willing to consider surgical AVR. I have reviewed with her again that this is her best option as it gives [name] more options in the future. She denies any chest pain, palpitations, lower extremity edema, orthopnea, PND, dizziness, near syncope or  syncope. She lives in [place], [place] alone. She is a retired from Clinical biochemist. No active dental issues.   Primary Care Physician: Aliene Beams, MD Primary Cardiologist: Bjorn Pippin Referring Cardiologist: Bjorn Pippin  Past Medical History:  Diagnosis Date   Aortic valve stenosis    Breast cancer [facility]) 2004   right breast; ER/PR+, Her2+   Cancer (HCC) 10/2002   right breast lumpectomy   CHF (congestive heart failure) (HCC)    GERD (gastroesophageal reflux disease)    HTN (hypertension)    Hyperlipidemia    Morton neuroma    Personal history of chemotherapy 11/19/2002   Personal history of radiation therapy 02/19/2003    Past Surgical History:  Procedure Laterality Date   BREAST BIOPSY Right 2019   BREAST LUMPECTOMY Right    BREAST SURGERY  10/2002   lumpectomy / right side   PACEMAKER IMPLANT N/A 01/21/2023   Procedure: PACEMAKER IMPLANT;  Surgeon: Duke Salvia, MD;  Location: [facility] INVASIVE CV LAB;  Service: Cardiovascular;  Laterality: N/A;   RIGHT/LEFT HEART CATH AND CORONARY ANGIOGRAPHY N/A 02/13/2021   Procedure: RIGHT/LEFT HEART CATH AND CORONARY ANGIOGRAPHY;  Surgeon: Kathleene Hazel, MD;  Location: [facility] INVASIVE CV LAB;  Service: Cardiovascular;  Laterality: N/A;    Current Outpatient Medications  Medication Sig Dispense Refill   acetaminophen (TYLENOL) 650 MG CR tablet Take 650 mg by mouth every 8 (eight) hours as needed for pain.     Calcium Carbonate (CALCI-CHEW PO) Take 1 tablet by mouth in  the morning.     calcium carbonate (TUMS EX) 750 MG chewable tablet Chew 2 tablets by mouth daily as needed for heartburn.     cholecalciferol (VITAMIN D3) 25 MCG (1000 UNIT) tablet Take 1,000 Units by mouth daily.     escitalopram (LEXAPRO) 10 MG tablet Take 10 mg by mouth daily.     losartan (COZAAR) 25 MG tablet TAKE 1 TABLET(25 MG) BY MOUTH DAILY 90 tablet 3   Polyethyl Glycol-Propyl Glycol (LUBRICATING EYE DROPS OP) Place 1 drop into both eyes daily as needed (dry  eyes).     rosuvastatin (CRESTOR) 10 MG tablet Take 10 mg by mouth daily.     No current facility-administered medications for this visit.    No Known Allergies  Social History   Socioeconomic History   Marital status: Divorced    Spouse name: Not on file   Number of children: 2   Years of education: Not on file   Highest education level: Not on file  Occupational History   Occupation: Retired-Customer Financial planner  Tobacco Use   Smoking status: Never   Smokeless tobacco: Never  Substance and Sexual Activity   Alcohol use: Yes    Alcohol/week: 1.0 - 2.0 standard drink of alcohol    Types: 1 - 2 Glasses of wine per week    Comment: occasional   Drug use: No   Sexual activity: Not on file  Other Topics Concern   Not on file  Social History Narrative   Not on file   Social Determinants of Health   Financial Resource Strain: Not on file  Food Insecurity: Not on file  Transportation Needs: Not on file  Physical Activity: Not on file  Stress: Not on file (09/01/2019)  Social Connections: Not on file  Intimate Partner Violence: Low Risk  (05/10/2020)   Received from [facility]   Intimate Partner Violence    Insults You: Not on file    Threatens You: Not on file    Screams at [place]: Not on file    Physically Hurt: Not on file    Intimate Partner Violence Score: Not on file    Family History  Problem Relation Age of Onset   Heart disease Mother    Diabetes Mother    Kidney failure Mother    Skin cancer Father        on ear; unknown type   Lung cancer Father 73       smoker; metastasis to bone and liver   Diabetes Brother    Heart disease Brother    Kidney failure Brother    Diabetes Maternal Aunt    Heart disease Maternal Aunt    Heart Problems Maternal Uncle    Diabetes Maternal Uncle    Skin cancer Paternal Uncle        on nose; unknown type   Heart attack Maternal Grandfather    Heart attack Paternal Grandmother    Heart attack Paternal Grandfather     Bladder Cancer Brother        dx. late 37s; smoker   Alzheimer's disease Paternal Aunt    Heart disease Paternal Aunt    Alzheimer's disease Paternal Uncle    Breast cancer Neg Hx     Review of Systems:  As stated in the HPI and otherwise negative.   BP 114/70   Pulse 78   Ht 5\' 3"  (1.6 m)   Wt 71.7 kg   SpO2 98%   BMI 27.99 kg/m  Physical Examination: General: Well developed, well nourished, NAD  HEENT: OP clear, mucus membranes moist  SKIN: warm, dry. No rashes. Neuro: No focal deficits  Musculoskeletal: Muscle strength 5/5 all ext  Psychiatric: Mood and affect normal  Neck: No JVD, no carotid bruits, no thyromegaly, no lymphadenopathy.  Lungs:Clear bilaterally, no wheezes, rhonci, crackles Cardiovascular: Regular rate and rhythm. Harsh systolic murmur.  Abdomen:Soft. Bowel sounds present. Non-tender.  Extremities: No lower extremity edema. Pulses are 2 + in the bilateral DP/PT.  EKG:  EKG is not ordered today. The ekg ordered today demonstrates   Echo 11/12/22: 1. Severe AS (mean gradient 26 mmHg; AVA 0.79 cm2; DI 0.25). LFLG AS  2. Left ventricular ejection fraction, by estimation, is 55 to 60%. The  left ventricle has normal function. The left ventricle has no regional  wall motion abnormalities. There is mild left ventricular hypertrophy.  Left ventricular diastolic parameters  are consistent with Grade I diastolic dysfunction (impaired relaxation).  The average left ventricular global longitudinal strain is -17.6 %. The  global longitudinal strain is normal.   3. Right ventricular systolic function is normal. The right ventricular  size is normal.   4. The mitral valve is normal in structure. Mild mitral valve  regurgitation. No evidence of mitral stenosis.   5. The aortic valve is tricuspid. Aortic valve regurgitation is mild.  Severe aortic valve stenosis.   6. The inferior vena cava is normal in size with greater than 50%  respiratory variability,  suggesting right atrial pressure of 3 mmHg.   FINDINGS   Left Ventricle: Left ventricular ejection fraction, by estimation, is 55  to 60%. The left ventricle has normal function. The left ventricle has no  regional wall motion abnormalities. The average left ventricular global  longitudinal strain is -17.6 %.  The global longitudinal strain is normal. The left ventricular internal  cavity size was normal in size. There is mild left ventricular  hypertrophy. Left ventricular diastolic parameters are consistent with  Grade I diastolic dysfunction (impaired  relaxation).   Right Ventricle: The right ventricular size is normal. Right ventricular  systolic function is normal.   Left Atrium: Left atrial size was normal in size.   Right Atrium: Right atrial size was normal in size.   Pericardium: There is no evidence of pericardial effusion.   Mitral Valve: The mitral valve is normal in structure. Mild mitral valve  regurgitation. No evidence of mitral valve stenosis.   Tricuspid Valve: The tricuspid valve is normal in structure. Tricuspid  valve regurgitation is mild . No evidence of tricuspid stenosis.   Aortic Valve: The aortic valve is tricuspid. Aortic valve regurgitation is  mild. Aortic regurgitation PHT measures 579 msec. Severe aortic stenosis  is present. Aortic valve mean gradient measures 26.5 mmHg. Aortic valve  peak gradient measures 41.2 mmHg.  Aortic valve area, by VTI measures 0.79 cm.   Pulmonic Valve: The pulmonic valve was normal in structure. Pulmonic valve  regurgitation is trivial. No evidence of pulmonic stenosis.   Aorta: The aortic root is normal in size and structure.   Venous: The inferior vena cava is normal in size with greater than 50%  respiratory variability, suggesting right atrial pressure of 3 mmHg.   IAS/Shunts: No atrial level shunt detected by color flow Doppler.   Additional Comments: Severe AS (mean gradient 26 mmHg; AVA 0.79 cm2; DI   0.25).     LEFT VENTRICLE  PLAX 2D  LVIDd:  3.50 cm   Diastology  LVIDs:         1.90 cm   LV e' medial:    5.76 cm/s  LV PW:         0.80 cm   LV E/e' medial:  14.2  LV IVS:        0.90 cm   LV e' lateral:   8.38 cm/s  LVOT diam:     2.00 cm   LV E/e' lateral: 9.8  LV SV:         66  LV SV Index:   38        2D Longitudinal Strain  LVOT Area:     3.14 cm  2D Strain GLS (A2C):   -19.6 %                           2D Strain GLS (A3C):   -23.2 %                           2D Strain GLS (A4C):   -9.9 %                           2D Strain GLS Avg:     -17.6 %                             3D Volume EF:                           3D EF:        68 %                           LV EDV:       120 ml                           LV ESV:       38 ml                           LV SV:        82 ml   RIGHT VENTRICLE  RV Basal diam:  3.10 cm  RV S prime:     9.14 cm/s  TAPSE (M-mode): 1.4 cm  RVSP:           23.2 mmHg   LEFT ATRIUM             Index        RIGHT ATRIUM           Index  LA diam:        3.60 cm 2.09 cm/m   RA Pressure: 3.00 mmHg  LA Vol (A2C):   55.5 ml 32.16 ml/m  RA Area:     11.30 cm  LA Vol (A4C):   42.6 ml 24.69 ml/m  RA Volume:   26.30 ml  15.24 ml/m  LA Biplane Vol: 50.1 ml 29.03 ml/m   AORTIC VALVE  AV Area (Vmax):    0.88 cm  AV Area (Vmean):   0.84 cm  AV Area (VTI):     0.79 cm  AV Vmax:           321.00  cm/s  AV Vmean:          226.800 cm/s  AV VTI:            0.835 m  AV Peak Grad:      41.2 mmHg  AV Mean Grad:      26.5 mmHg  LVOT Vmax:         89.96 cm/s  LVOT Vmean:        60.520 cm/s  LVOT VTI:          0.210 m  LVOT/AV VTI ratio: 0.25  AI PHT:            579 msec    AORTA  Ao Root diam: 2.70 cm  Ao Asc diam:  3.40 cm   MITRAL VALVE                TRICUSPID VALVE  MV Area (PHT)  cm          TR Peak grad:   20.2 mmHg  MV Decel Time: 217 msec     TR Vmax:        225.00 cm/s  MV E velocity: 82.00 cm/s   Estimated RAP:  3.00 mmHg  MV A  velocity: 105.50 cm/s  RVSP:           23.2 mmHg  MV E/A ratio:  0.78                              SHUNTS                              Systemic VTI:  0.21 m                              Systemic Diam: 2.00 cm    Recent Labs: 01/14/2023: BUN 18; Creatinine, Ser 1.04; Hemoglobin 12.7; Platelets 360; Potassium 4.8; Sodium 138   Lipid Panel No results found for: "CHOL", "TRIG", "HDL", "CHOLHDL", "VLDL", "LDLCALC", "LDLDIRECT"   Wt Readings from Last 3 Encounters:  03/05/23 71.7 kg  02/13/23 71.6 kg  01/21/23 69.9 kg     Assessment and Plan:   1. Severe Aortic Valve Stenosis: She has bicuspid aortic valve stenosis with severe paradoxical low flow/low gradient aortic stenosis. She has NYHA class 2-3 symptoms. I have personally reviewed the echo images. The aortic valve is thickened and calcified. The valve appears to be bicuspid with known fusion of the right and left coronary cusps by coronary CTA (Report: Possible forme frust bicuspid AV). There is no other good explanation for her symptoms. Given her young age and anatomy with probable bicuspid AV, and the fact that she is sized to a 23 mm Edwards valve (this would limit future valve in valve procedure) she would will likely be best treated with surgical AVR.  I referred her to see Dr. Leafy Ro in May 2024 but she refused to consider surgical AVR at that time. She has reconsidered and would be willing to have surgical AVR. She had a normal cardiac cath in July 2022 with no evidence of CAD. Coronary CTA and chest/abd/pelvis CTA August 2022. I do not think we need to repeat any of these studies  I have reviewed the natural history of aortic stenosis with the patient and their family members  who are present today. We have discussed the limitations  of medical therapy and the poor prognosis associated with symptomatic aortic stenosis. We have reviewed potential treatment options, including palliative medical therapy, conventional surgical aortic valve  replacement, and transcatheter aortic valve replacement. We discussed treatment options in the context of the patient's specific comorbid medical conditions.   Will refer back to CT surgery to see Dr. Leafy Ro.     Labs/ tests ordered today include:  No orders of the defined types were placed in this encounter.  Disposition:   F/U will be arranged with CT surgery  Signed, Verne Carrow, MD, [facility] 03/05/2023 11:39 AM    [facility] [address] [place], [place], [place]  [address] Phone: [phone]; Fax: [phone]

## 2023-03-05 NOTE — Patient Instructions (Signed)

## 2023-04-05 NOTE — Progress Notes (Unsigned)
[address]       [place] [address]             [phone]           [name] Medical Record #[id number] Date of Birth: [birth year]  Tasha Amabile, MD  Chief Complaint: Increasing DOE and fatigue    History of Present Illness:     Pt is a 67 yo female who I evaluated back in May for paradoxical low flow low gradient AS who had bicuspid valve and felt to have low coronary issues for TAVR who was not as symptomatic at that time and wished to hold off on surgery until later. Pt however was found to suffer heart block this summer needing PPM. She now has become very fatigued and with DOE that she is no longer able to do her activities she loved doing like taking walks and water aerobics. She was seen by Dr Clifton James and he felt she was symptomatic and needing SAVR to improve. She had a cath in 2022 without CAD      Past Medical History:  Diagnosis Date   Aortic valve stenosis    Breast cancer (HCC) 2004   right breast; ER/PR+, Her2+   Cancer (HCC) 10/2002   right breast lumpectomy   CHF (congestive heart failure) (HCC)    GERD (gastroesophageal reflux disease)    HTN (hypertension)    Hyperlipidemia    Morton neuroma    Personal history of chemotherapy 11/19/2002   Personal history of radiation therapy 02/19/2003    Past Surgical History:  Procedure Laterality Date   BREAST BIOPSY Right 2019   BREAST LUMPECTOMY Right    BREAST SURGERY  10/2002   lumpectomy / right side   PACEMAKER IMPLANT N/A 01/21/2023   Procedure: PACEMAKER IMPLANT;  Surgeon: Duke Salvia, MD;  Location: [facility] INVASIVE CV LAB;  Service: Cardiovascular;  Laterality: N/A;   RIGHT/LEFT HEART CATH AND CORONARY ANGIOGRAPHY N/A 02/13/2021   Procedure: RIGHT/LEFT HEART CATH AND CORONARY ANGIOGRAPHY;  Surgeon: Kathleene Hazel, MD;  Location: [facility] INVASIVE CV LAB;  Service: Cardiovascular;  Laterality: N/A;    Social History   Tobacco  Use  Smoking Status Never  Smokeless Tobacco Never    Social History   Substance and Sexual Activity  Alcohol Use Yes   Alcohol/week: 1.0 - 2.0 standard drink of alcohol   Types: 1 - 2 Glasses of wine per week   Comment: occasional    Social History   Socioeconomic History   Marital status: Divorced    Spouse name: Not on file   Number of children: 2   Years of education: Not on file   Highest education level: Not on file  Occupational History   Occupation: Retired-Customer Financial planner  Tobacco Use   Smoking status: Never   Smokeless tobacco: Never  Substance and Sexual Activity   Alcohol use: Yes    Alcohol/week: 1.0 - 2.0 standard drink of alcohol    Types: 1 - 2 Glasses of wine per week    Comment: occasional   Drug use: No   Sexual activity: Not on file  Other Topics Concern   Not on file  Social History Narrative   Not on file   Social Determinants of Health   Financial Resource Strain: Not on file  Food Insecurity: Not on file  Transportation Needs: Not on file  Physical Activity: Not on file  Stress: Not  on file (09/01/2019)  Social Connections: Not on file  Intimate Partner Violence: Low Risk  (05/10/2020)   [facility]   Intimate Partner Violence    Insults You: Not on file    Threatens You: Not on file    Screams at You: Not on file    Physically Hurt: Not on file    Intimate Partner Violence Score: Not on file    No Known Allergies  Current Outpatient Medications  Medication Sig Dispense Refill   acetaminophen (TYLENOL) 650 MG CR tablet Take 650 mg by mouth every 8 (eight) hours as needed for pain.     Calcium Carbonate (CALCI-CHEW PO) Take 1 tablet by mouth in the morning.     calcium carbonate (TUMS EX) 750 MG chewable tablet Chew 2 tablets by mouth daily as needed for heartburn.     cholecalciferol (VITAMIN D3) 25 MCG (1000 UNIT) tablet Take 1,000 Units by mouth daily.     escitalopram (LEXAPRO) 10 MG  tablet Take 10 mg by mouth daily.     losartan (COZAAR) 25 MG tablet TAKE 1 TABLET(25 MG) BY MOUTH DAILY 90 tablet 3   Polyethyl Glycol-Propyl Glycol (LUBRICATING EYE DROPS OP) Place 1 drop into both eyes daily as needed (dry eyes).     rosuvastatin (CRESTOR) 10 MG tablet Take 10 mg by mouth daily.     No current facility-administered medications for this visit.     Family History  Problem Relation Age of Onset   Heart disease Mother    Diabetes Mother    Kidney failure Mother    Skin cancer Father        on ear; unknown type   Lung cancer Father 11       smoker; metastasis to bone and liver   Diabetes Brother    Heart disease Brother    Kidney failure Brother    Diabetes Maternal Aunt    Heart disease Maternal Aunt    Heart Problems Maternal Uncle    Diabetes Maternal Uncle    Skin cancer Paternal Uncle        on nose; unknown type   Heart attack Maternal Grandfather    Heart attack Paternal Grandmother    Heart attack Paternal Grandfather    Bladder Cancer Brother        dx. late 62s; smoker   Alzheimer's disease Paternal Aunt    Heart disease Paternal Aunt    Alzheimer's disease Paternal Uncle    Breast cancer Neg Hx        Physical Exam: Deffered     Diagnostic Studies & Laboratory data: I have personally reviewed the following studies and agree with the findings     Recent Radiology Findings:       Recent Lab Findings: Lab Results  Component Value Date   WBC 5.5 01/14/2023   HGB 12.7 01/14/2023   HCT 38.8 01/14/2023   PLT 360 01/14/2023   GLUCOSE 119 (H) 01/14/2023   ALT 48 02/01/2015   AST 28 02/01/2015   NA 138 01/14/2023   K 4.8 01/14/2023   CL 106 01/14/2023   CREATININE 1.04 (H) 01/14/2023   BUN 18 01/14/2023   CO2 25 01/14/2023      Assessment / Plan:     67 yo female with NYHA class 2 symptoms of severe paradoxical low flow low gradient bicuspid AS who had low coronary ostia to be a good candidate for TAVR. She would best be served  now with tissue SAVR and will need repeat cath to rule out CAD (its been over 2 years since last cath). She also has recent PPM and we discussed all the risks and goals of surgery and the recovery and she understands and wishes to proceed. Will proceed on October 16th.    I have spent 30 min in review of the records, viewing studies and in face to face with patient and in coordination of future care    Eugenio Hoes 04/05/2023 9:49 AM

## 2023-04-06 ENCOUNTER — Encounter: Payer: Self-pay | Admitting: Thoracic Surgery (Cardiothoracic Vascular Surgery)

## 2023-04-06 ENCOUNTER — Encounter: Payer: Self-pay | Admitting: *Deleted

## 2023-04-06 ENCOUNTER — Other Ambulatory Visit: Payer: Self-pay | Admitting: *Deleted

## 2023-04-06 ENCOUNTER — Other Ambulatory Visit: Payer: Self-pay | Admitting: Thoracic Surgery (Cardiothoracic Vascular Surgery)

## 2023-04-06 ENCOUNTER — Institutional Professional Consult (permissible substitution): Payer: Medicare Other | Admitting: Thoracic Surgery (Cardiothoracic Vascular Surgery)

## 2023-04-06 ENCOUNTER — Telehealth: Payer: Self-pay

## 2023-04-06 VITALS — BP 128/81 | HR 90 | Resp 20 | Ht 63.0 in | Wt 157.0 lb

## 2023-04-06 DIAGNOSIS — I350 Nonrheumatic aortic (valve) stenosis: Secondary | ICD-10-CM

## 2023-04-06 NOTE — Patient Instructions (Signed)
Cath  Non contrast ct of chest Tissue AVR 10/16

## 2023-04-06 NOTE — H&P (View-Only) (Signed)
Cardiology Office Note:    Date:  04/07/2023   ID:  Tasha, Cox 1955/09/09, MRN [id number]  PCP:  Aliene Beams, MD  Cardiologist:  Little Ishikawa, MD  Electrophysiologist:  None   Referring MD: Aliene Beams, MD   Chief Complaint  Patient presents with   Cardiac Valve Problem     History of Present Illness:    [name] is a 67 y.o. female with a hx of breast cancer, hyperlipidemia who presents for follow-up.  She was referred by Dr. Tracie Harrier for evaluation of heart murmur, initially seen on 10/24/2019.  She reports that she followed at [facility] as a child, was told she had an issue with her aortic valve.  She had a heart catheterization done in middle school.  States that she followed with cardiology there until college, has not been seen for 40 years.  Reports that she was told that everything resolved and she did not need further cardiology follow-up.  She denies any chest pain or dyspnea.  States that she walks about twice per week, usually from 45 minutes to an hour.  Prior to pandemic was walking for 1 to 2 hours.  She denies any lightheadedness, syncope, or palpitations.  Does report occasional lower extremity edema.  No history of smoking.  Family history includes mother died of MI at age 73, and brother had CABG/AVR and died at age 18.  TTE February 25, 2007 showed LVEF 55%, mild LV dilatation, mild MR, mild AI.  TTE on 11/14/2019 showed LVEF 65 to 70%, indeterminate diastolic filling, normal RV systolic function, mild aortic stenosis, mild aortic regurgitation, mild mitral regurgitation.  Echocardiogram on 01/16/2021 showed EF 40 to 45%, mild RV dysfunction, moderate to severe aortic stenosis (V-max 3.0 m/s, mean gradient 20 mmHg, AVA 0.8 cm, DI 0.24). LHC/RHC on 02/13/21 showed no evidence of CAD, moderate to severe aortic stenosis (mean gradient 27 mmHg, AVA 1.1 cm), RA 1, RV 19/0, PA 20/8/12, PW CP 8, LVEDP 8, CI 3.2.  CTA shows tricuspid aortic valve with  partial fusion of RCC/LCC, possibly a forme fruste bicuspid aortic valve.  Echocardiogram on 07/16/2021 showed EF had improved to 65 to 70% and aortic stenosis was mild to moderate (V-max 2.8 m/s, mean gradient 18 mmHg, AVA 1.3 cm).  Echocardiogram 06/2022 showed an EF 60 to 65%, normal RV function, moderate to severe aortic stenosis (Vmax 3.0 m/s, MG 19 mmHg, AVA 0.8 cm^2, DI 0.27).  Echocardiogram 10/2022 showed severe aortic stenosis (mean gradient 26 mmHg, AVA 0.8 cm, DI 0.25), EF 55 to 60%, normal RV function.  She was found to have heart block and underwent Zio patch x 3 days which showed 2030 episodes of AV block (second-degree Mobitz 2, high-grade and third-degree). Had third-degree AV block with heart rate down to 36 bpm.  Underwent PPM placement with Dr. Graciela Husbands on 01/21/2023.  Since last clinic visit, she reports she is doing well.  She did reports he did have some chest pain last night, felt pressure while lying down.  She does feel short of breath when walking up stairs.  She reports some lightheadedness but denies any syncope.  Past Medical History:  Diagnosis Date   Aortic valve stenosis    Breast cancer (HCC) 2004   right breast; ER/PR+, Her2+   Cancer (HCC) 10/2002   right breast lumpectomy   CHF (congestive heart failure) (HCC)    GERD (gastroesophageal reflux disease)    HTN (hypertension)    Hyperlipidemia  Morton neuroma    Personal history of chemotherapy 11/19/2002   Personal history of radiation therapy 02/19/2003    Past Surgical History:  Procedure Laterality Date   BREAST BIOPSY Right 2019   BREAST LUMPECTOMY Right    BREAST SURGERY  10/2002   lumpectomy / right side   PACEMAKER IMPLANT N/A 01/21/2023   Procedure: PACEMAKER IMPLANT;  Surgeon: Duke Salvia, MD;  Location: [facility] INVASIVE CV LAB;  Service: Cardiovascular;  Laterality: N/A;   RIGHT/LEFT HEART CATH AND CORONARY ANGIOGRAPHY N/A 02/13/2021   Procedure: RIGHT/LEFT HEART CATH AND CORONARY ANGIOGRAPHY;   Surgeon: Kathleene Hazel, MD;  Location: [facility] INVASIVE CV LAB;  Service: Cardiovascular;  Laterality: N/A;    Current Medications: Current Meds  Medication Sig   acetaminophen (TYLENOL) 650 MG CR tablet Take 650 mg by mouth every 8 (eight) hours as needed for pain.   aspirin EC 81 MG tablet Take 1 tablet morning of procedure   Calcium Carbonate (CALCI-CHEW PO) Take 1 tablet by mouth in the morning.   calcium carbonate (TUMS EX) 750 MG chewable tablet Chew 2 tablets by mouth daily as needed for heartburn.   cholecalciferol (VITAMIN D3) 25 MCG (1000 UNIT) tablet Take 1,000 Units by mouth daily.   escitalopram (LEXAPRO) 10 MG tablet Take 10 mg by mouth daily.   losartan (COZAAR) 25 MG tablet TAKE 1 TABLET(25 MG) BY MOUTH DAILY   Polyethyl Glycol-Propyl Glycol (LUBRICATING EYE DROPS OP) Place 1 drop into both eyes daily as needed (dry eyes).   rosuvastatin (CRESTOR) 10 MG tablet Take 10 mg by mouth daily.     Allergies:   Patient has no known allergies.   Social History   Socioeconomic History   Marital status: Divorced    Spouse name: Not on file   Number of children: 2   Years of education: Not on file   Highest education level: Not on file  Occupational History   Occupation: Retired-Customer Financial planner  Tobacco Use   Smoking status: Never   Smokeless tobacco: Never  Substance and Sexual Activity   Alcohol use: Yes    Alcohol/week: 1.0 - 2.0 standard drink of alcohol    Types: 1 - 2 Glasses of wine per week    Comment: occasional   Drug use: No   Sexual activity: Not on file  Other Topics Concern   Not on file  Social History Narrative   Not on file   Social Determinants of Health   Financial Resource Strain: Not on file  Food Insecurity: Not on file  Transportation Needs: Not on file  Physical Activity: Not on file  Stress: Not on file (09/01/2019)  Social Connections: Not on file     Family History: The patient's family history includes Alzheimer's  disease in her paternal aunt and paternal uncle; Bladder Cancer in her brother; Diabetes in her brother, maternal aunt, maternal uncle, and mother; Heart Problems in her maternal uncle; Heart attack in her maternal grandfather, paternal grandfather, and paternal grandmother; Heart disease in her brother, maternal aunt, mother, and paternal aunt; Kidney failure in her brother and mother; Lung cancer (age of onset: 84) in her father; Skin cancer in her father and paternal uncle. There is no history of Breast cancer.  ROS:   Please see the history of present illness.  All other systems reviewed and are negative.  EKGs/Labs/Other Studies Reviewed:    The following studies were reviewed today:   EKG:   04/07/2023: Normal sinus rhythm, rate 77  02/13/23: A-sesned, V-paced, rate 63 11/04/2022: Normal sinus rhythm, rate 71, LVH 05/13/22: Normal sinus rhythm, rate 82, LVH, Q waves in V1-3 08/06/21: Normal sinus rhythm, rate 70, LVH, left axis deviation 07/22:  NSR, rate 69, LVH 11/27/2020 (PCP Office): NSR, rate 69 bpm, LBBB 11/29/2019: EKG was not ordered.   10/24/2019: normal sinus rhythm, rate 73, Q waves in V1/2, no ST/T abnormalities  TTE 11/14/19:  1. Left ventricular ejection fraction, by estimation, is 65 to 70%. The  left ventricle has normal function. The left ventricle has no regional  wall motion abnormalities. Indeterminate diastolic filling due to E-A  fusion.   2. Right ventricular systolic function is normal. The right ventricular  size is normal. There is normal pulmonary artery systolic pressure.   3. The mitral valve is normal in structure. Mild mitral valve  regurgitation.   4. AV is thickened, calcified Peak and mean gradients through the valve  are 29 and 17 mm Hg respectively consistent with mild aortic stenosis..  The aortic valve is tricuspid. Aortic valve regurgitation is mild. Mild  aortic valve stenosis.   5. The inferior vena cava is normal in size with greater than 50%   respiratory variability, suggesting right atrial pressure of 3 mmHg.   Recent Labs: 01/14/2023: BUN 18; Creatinine, Ser 1.04; Hemoglobin 12.7; Platelets 360; Potassium 4.8; Sodium 138  Recent Lipid Panel No results found for: "CHOL", "TRIG", "HDL", "CHOLHDL", "VLDL", "LDLCALC", "LDLDIRECT"  Physical Exam:    VS:  BP 115/76 (BP Location: Right Arm, Patient Position: Sitting, Cuff Size: Large)   Pulse 77   Ht 5\' 3"  (1.6 m)   Wt 157 lb 14.4 oz (71.6 kg)   SpO2 97%   BMI 27.97 kg/m     Wt Readings from Last 3 Encounters:  04/07/23 157 lb 14.4 oz (71.6 kg)  04/06/23 157 lb (71.2 kg)  03/05/23 158 lb (71.7 kg)     GEN:  Well nourished, well developed in no acute distress HEENT: Normal NECK: No JVD CARDIAC: RRR,  2/6 systolic heart murmur loudest at the RUSB RESPIRATORY:  Clear to auscultation without rales, wheezing or rhonchi  ABDOMEN: Soft, non-tender, non-distended MUSCULOSKELETAL:  No edema; No deformity  SKIN: Warm and dry NEUROLOGIC:  Alert and oriented x 3 PSYCHIATRIC:  Normal affect   ASSESSMENT:    1. Severe aortic stenosis   2. Heart block AV second degree   3. Chronic combined systolic and diastolic heart failure (HCC)   4. Essential hypertension   5. Pre-procedure lab exam       PLAN:    Aortic stenosis: Echocardiogram on 01/16/2021 showed EF 40 to 45%, mild RV dysfunction, moderate to severe aortic stenosis (V-max 3.0 m/s, mean gradient 20 mmHg, AVA 0.8 cm, DI 0.24).  AV calcium score only 365 on 01/16/21, argues against severe AS.  LHC/RHC on 02/13/21 showed no evidence of CAD, moderate to severe aortic stenosis (mean gradient 27 mmHg, AVA 1.1 cm), RA 1, RV 19/0, PA 20/8/12, PW CP 8, LVEDP 8, CI 3.2.  CTA shows tricuspid aortic valve with partial fusion of RCC/LCC, possibly a forme fruste bicuspid aortic valve.  Echocardiogram on 07/16/2021 showed EF had improved to 65 to 70% and aortic stenosis was mild to moderate (V-max 2.8 m/s, mean gradient 18 mmHg, AVA 1.3  cm).  Echocardiogram 06/2022 showed an EF 60 to 65%, normal RV function, moderate to severe aortic stenosis (Vmax 3.0 m/s, MG 19 mmHg, AVA 0.8 cm^2, DI 0.27).  Echocardiogram 10/2022 showed severe  aortic stenosis (mean gradient 26 mmHg, AVA 0.8 cm, DI 0.25), EF 55 to 60%, normal RV function.  -She was seen by Dr. Clifton James in structural heart clinic and Dr. Leafy Ro in cardiac surgery.  SAVR recommended but decision was to hold off as she was minimally symptomatic.  She is now reporting she is having dyspnea on exertion, reports short of breath walking up a flight of stairs, which is new for her.  In addition she developed complete heart block and underwent PPM.  Dr. Leafy Ro planning AVR next month, requesting LHC prior.  Will schedule.  Risks and benefits of cardiac catheterization have been discussed with the patient.  These include bleeding, infection, kidney damage, stroke, heart attack, death.  The patient understands these risks and is willing to proceed.   Chronic combined systolic and diastolic heart failure:  new diagnosis.  Echocardiogram on 01/16/2021 showed EF 40 to 45%, mild RV dysfunction, moderate to severe aortic stenosis (V-max 3.0 m/s, mean gradient 20 mmHg, AVA 0.8 cm, DI 0.24).  LHC/RHC on 02/13/21 showed no evidence of CAD, moderate to severe aortic stenosis (mean gradient 27 mmHg, AVA 1.1 cm), RA 1, RV 19/0, PA 20/8/12, PW CP 8, LVEDP 8, CI 3.2.  CTA shows tricuspid aortic valve with partial fusion of RCC/LCC, possibly a forme fruste bicuspid aortic valve.  Echocardiogram on 07/16/2021 showed EF had improved to 65 to 70% and aortic stenosis was mild to moderate (V-max 2.8 m/s, mean gradient 18 mmHg, AVA 1.3 cm). -Coreg was discontinued when having bradycardia -Continue losartan 25 mg daily -Discussed cardiac MRI for work-up nonischemic cardiomyopathy, but patient reports she has severe claustrophobia and is unable to tolerate MRI.  Will hold off given improvement in systolic  function  Heart block: She was found to have heart block and underwent Zio patch x 3 days which showed 2030 episodes of AV block (second-degree Mobitz 2, high-grade and third-degree). Had third-degree AV block with heart rate down to 36 bpm.  Suspect due to her severe calcific aortic stenosis.  Underwent PPM placement with Dr. Graciela Husbands on 01/21/2023.  Hyperlipidemia: LDL 148 on 11/29/21.  10 year ASCVD risk score 7%, borderline for meeting indication to start statin.  Caclium score 0 on 01/16/21  Hypertension: On losartan, appears controlled   RTC in 3 months   Medication Adjustments/Labs and Tests Ordered: Current medicines are reviewed at length with the patient today.  Concerns regarding medicines are outlined above.  Orders Placed This Encounter  Procedures   CBC with Differential/Platelet   Basic metabolic panel   EKG 12-Lead     Meds ordered this encounter  Medications   aspirin EC 81 MG tablet    Sig: Take 1 tablet morning of procedure    Dispense:  32 tablet    Refill:  0    Order Specific Question:   Lot Number?    Answer:   [id number]    Order Specific Question:   Expiration Date?    Answer:   09/19/2023       Patient Instructions  Medication Instructions:  TAKE 1 ASPIRIN MORNING OF CATH   *If you need a refill on your cardiac medications before your next appointment, please call your pharmacy*  Lab Work: CBC/BMET TODAY   If you have labs (blood work) drawn today and your tests are completely normal, you will receive your results only by: MyChart Message (if you have MyChart) OR A paper copy in the mail If you have any lab test that is abnormal  or we need to change your treatment, we will call you to review the results.  Testing/Procedures: Your physician has requested that you have a cardiac catheterization. Cardiac catheterization is used to diagnose and/or treat various heart conditions. Doctors may recommend this procedure for a number of different reasons. The  most common reason is to evaluate chest pain. Chest pain can be a symptom of coronary artery disease (CAD), and cardiac catheterization can show whether plaque is narrowing or blocking your heart's arteries. This procedure is also used to evaluate the valves, as well as measure the blood flow and oxygen levels in different parts of your heart. For further information please visit [email or web address] Please follow instruction sheet, as given.  Follow-Up: At [facility], you and your health needs are our priority.  As part of our continuing mission to provide you with exceptional heart care, we have created designated Provider Care Teams.  These Care Teams include your primary Cardiologist (physician) and Advanced Practice Providers (APPs -  Physician Assistants and Nurse Practitioners) who all work together to provide you with the care you need, when you need it.  We recommend signing up for the patient portal called "MyChart".  Sign up information is provided on this After Visit Summary.  MyChart is used to connect with patients for Virtual Visits (Telemedicine).  Patients are able to view lab/test results, encounter notes, upcoming appointments, etc.  Non-urgent messages can be sent to your provider as well.   To learn more about what you can do with MyChart, go to [email or web address].    Your next appointment:   3 month(s)  Provider:   DR Bjorn Pippin   Other Instructions  You are scheduled for a Cardiac Catheterization on Tuesday, September 24 with Dr. Peter [place].  1. Please arrive at the [facility] (Main Entrance A) at [facility]: [address] [place], [place] [address] at 8:30 AM (This time is  2 hour(s) before your procedure to ensure your preparation). Free valet parking service is available. You will check in at ADMITTING. The support person will be asked to wait in the waiting room.  It is OK to have someone drop you off and come back when you are ready to be  discharged.    Special note: Every effort is made to have your procedure done on time. Please understand that emergencies sometimes delay scheduled procedures.  2. Diet: Do not eat solid foods after midnight.  The patient may have clear liquids until 5am upon the day of the procedure.  3. Labs: You will need to have blood drawn today   4. Medication instructions in preparation for your procedure:   Contrast Allergy: No  On the morning of your procedure, take your Aspirin 81 mg and any morning medicines NOT listed above.  You may use sips of water.  5. Plan to go home the same day, you will only stay overnight if medically necessary. 6. Bring a current list of your medications and current insurance cards. 7. You MUST have a responsible person to drive you home. 8. Someone MUST be with you the first 24 hours after you arrive home or your discharge will be delayed. 9. Please wear clothes that are easy to get on and off and wear slip-on shoes.  Thank you for allowing [name] to care for you!   -- [facility] Invasive Cardiovascular services     Signed, Little Ishikawa, MD  04/07/2023 12:51 PM    [facility]

## 2023-04-06 NOTE — Telephone Encounter (Signed)
Spoke to patient advised Dr.Schumann wants to see you in office to schedule cardiac cath before valve surgery next month.Appointment scheduled 9/17 at 9:20 am at [facility] office.Directions given.

## 2023-04-06 NOTE — Progress Notes (Unsigned)
Cardiology Office Note:    Date:  04/07/2023   ID:  [name] 09-01-1955, MRN [id number]  PCP:  Aliene Beams, MD  Cardiologist:  Little Ishikawa, MD  Electrophysiologist:  None   Referring MD: Aliene Beams, MD   Chief Complaint  Patient presents with   Cardiac Valve Problem     History of Present Illness:    [name] is a 67 y.o. female with a hx of breast cancer, hyperlipidemia who presents for follow-up.  She was referred by Dr. Tracie Harrier for evaluation of heart murmur, initially seen on 10/24/2019.  She reports that she followed at [facility] as a child, was told she had an issue with her aortic valve.  She had a heart catheterization done in middle school.  States that she followed with cardiology there until college, has not been seen for 40 years.  Reports that she was told that everything resolved and she did not need further cardiology follow-up.  She denies any chest pain or dyspnea.  States that she walks about twice per week, usually from 45 minutes to an hour.  Prior to pandemic was walking for 1 to 2 hours.  She denies any lightheadedness, syncope, or palpitations.  Does report occasional lower extremity edema.  No history of smoking.  Family history includes mother died of MI at age 30, and brother had CABG/AVR and died at age 54.  TTE 02/09/2007 showed LVEF 55%, mild LV dilatation, mild MR, mild AI.  TTE on 11/14/2019 showed LVEF 65 to 70%, indeterminate diastolic filling, normal RV systolic function, mild aortic stenosis, mild aortic regurgitation, mild mitral regurgitation.  Echocardiogram on 01/16/2021 showed EF 40 to 45%, mild RV dysfunction, moderate to severe aortic stenosis (V-max 3.0 m/s, mean gradient 20 mmHg, AVA 0.8 cm, DI 0.24). LHC/RHC on 02/13/21 showed no evidence of CAD, moderate to severe aortic stenosis (mean gradient 27 mmHg, AVA 1.1 cm), RA 1, RV 19/0, PA 20/8/12, PW CP 8, LVEDP 8, CI 3.2.  CTA shows tricuspid aortic valve with  partial fusion of RCC/LCC, possibly a forme fruste bicuspid aortic valve.  Echocardiogram on 07/16/2021 showed EF had improved to 65 to 70% and aortic stenosis was mild to moderate (V-max 2.8 m/s, mean gradient 18 mmHg, AVA 1.3 cm).  Echocardiogram 06/2022 showed an EF 60 to 65%, normal RV function, moderate to severe aortic stenosis (Vmax 3.0 m/s, MG 19 mmHg, AVA 0.8 cm^2, DI 0.27).  Echocardiogram 10/2022 showed severe aortic stenosis (mean gradient 26 mmHg, AVA 0.8 cm, DI 0.25), EF 55 to 60%, normal RV function.  She was found to have heart block and underwent Zio patch x 3 days which showed 2030 episodes of AV block (second-degree Mobitz 2, high-grade and third-degree). Had third-degree AV block with heart rate down to 36 bpm.  Underwent PPM placement with Dr. Graciela Husbands on 01/21/2023.  Since last clinic visit, she reports she is doing well.  She did reports he did have some chest pain last night, felt pressure while lying down.  She does feel short of breath when walking up stairs.  She reports some lightheadedness but denies any syncope.  Past Medical History:  Diagnosis Date   Aortic valve stenosis    Breast cancer (HCC) 2004   right breast; ER/PR+, Her2+   Cancer (HCC) 10/2002   right breast lumpectomy   CHF (congestive heart failure) (HCC)    GERD (gastroesophageal reflux disease)    HTN (hypertension)    Hyperlipidemia  Morton neuroma    Personal history of chemotherapy 11/19/2002   Personal history of radiation therapy 02/19/2003    Past Surgical History:  Procedure Laterality Date   BREAST BIOPSY Right 2019   BREAST LUMPECTOMY Right    BREAST SURGERY  10/2002   lumpectomy / right side   PACEMAKER IMPLANT N/A 01/21/2023   Procedure: PACEMAKER IMPLANT;  Surgeon: Duke Salvia, MD;  Location: [facility] INVASIVE CV LAB;  Service: Cardiovascular;  Laterality: N/A;   RIGHT/LEFT HEART CATH AND CORONARY ANGIOGRAPHY N/A 02/13/2021   Procedure: RIGHT/LEFT HEART CATH AND CORONARY ANGIOGRAPHY;   Surgeon: Kathleene Hazel, MD;  Location: [facility] INVASIVE CV LAB;  Service: Cardiovascular;  Laterality: N/A;    Current Medications: Current Meds  Medication Sig   acetaminophen (TYLENOL) 650 MG CR tablet Take 650 mg by mouth every 8 (eight) hours as needed for pain.   aspirin EC 81 MG tablet Take 1 tablet morning of procedure   Calcium Carbonate (CALCI-CHEW PO) Take 1 tablet by mouth in the morning.   calcium carbonate (TUMS EX) 750 MG chewable tablet Chew 2 tablets by mouth daily as needed for heartburn.   cholecalciferol (VITAMIN D3) 25 MCG (1000 UNIT) tablet Take 1,000 Units by mouth daily.   escitalopram (LEXAPRO) 10 MG tablet Take 10 mg by mouth daily.   losartan (COZAAR) 25 MG tablet TAKE 1 TABLET(25 MG) BY MOUTH DAILY   Polyethyl Glycol-Propyl Glycol (LUBRICATING EYE DROPS OP) Place 1 drop into both eyes daily as needed (dry eyes).   rosuvastatin (CRESTOR) 10 MG tablet Take 10 mg by mouth daily.     Allergies:   Patient has no known allergies.   Social History   Socioeconomic History   Marital status: Divorced    Spouse name: Not on file   Number of children: 2   Years of education: Not on file   Highest education level: Not on file  Occupational History   Occupation: Retired-Customer Financial planner  Tobacco Use   Smoking status: Never   Smokeless tobacco: Never  Substance and Sexual Activity   Alcohol use: Yes    Alcohol/week: 1.0 - 2.0 standard drink of alcohol    Types: 1 - 2 Glasses of wine per week    Comment: occasional   Drug use: No   Sexual activity: Not on file  Other Topics Concern   Not on file  Social History Narrative   Not on file   Social Determinants of Health   Financial Resource Strain: Not on file  Food Insecurity: Not on file  Transportation Needs: Not on file  Physical Activity: Not on file  Stress: Not on file (09/01/2019)  Social Connections: Not on file     Family History: The patient's family history includes Alzheimer's  disease in her paternal aunt and paternal uncle; Bladder Cancer in her brother; Diabetes in her brother, maternal aunt, maternal uncle, and mother; Heart Problems in her maternal uncle; Heart attack in her maternal grandfather, paternal grandfather, and paternal grandmother; Heart disease in her brother, maternal aunt, mother, and paternal aunt; Kidney failure in her brother and mother; Lung cancer (age of onset: 72) in her father; Skin cancer in her father and paternal uncle. There is no history of Breast cancer.  ROS:   Please see the history of present illness.  All other systems reviewed and are negative.  EKGs/Labs/Other Studies Reviewed:    The following studies were reviewed today:   EKG:   04/07/2023: Normal sinus rhythm, rate 77  02/13/23: A-sesned, V-paced, rate 63 11/04/2022: Normal sinus rhythm, rate 71, LVH 05/13/22: Normal sinus rhythm, rate 82, LVH, Q waves in V1-3 08/06/21: Normal sinus rhythm, rate 70, LVH, left axis deviation 07/22:  NSR, rate 69, LVH 11/27/2020 (PCP Office): NSR, rate 69 bpm, LBBB 11/29/2019: EKG was not ordered.   10/24/2019: normal sinus rhythm, rate 73, Q waves in V1/2, no ST/T abnormalities  TTE 11/14/19:  1. Left ventricular ejection fraction, by estimation, is 65 to 70%. The  left ventricle has normal function. The left ventricle has no regional  wall motion abnormalities. Indeterminate diastolic filling due to E-A  fusion.   2. Right ventricular systolic function is normal. The right ventricular  size is normal. There is normal pulmonary artery systolic pressure.   3. The mitral valve is normal in structure. Mild mitral valve  regurgitation.   4. AV is thickened, calcified Peak and mean gradients through the valve  are 29 and 17 mm Hg respectively consistent with mild aortic stenosis..  The aortic valve is tricuspid. Aortic valve regurgitation is mild. Mild  aortic valve stenosis.   5. The inferior vena cava is normal in size with greater than 50%   respiratory variability, suggesting right atrial pressure of 3 mmHg.   Recent Labs: 01/14/2023: BUN 18; Creatinine, Ser 1.04; Hemoglobin 12.7; Platelets 360; Potassium 4.8; Sodium 138  Recent Lipid Panel No results found for: "CHOL", "TRIG", "HDL", "CHOLHDL", "VLDL", "LDLCALC", "LDLDIRECT"  Physical Exam:    VS:  BP 115/76 (BP Location: Right Arm, Patient Position: Sitting, Cuff Size: Large)   Pulse 77   Ht 5\' 3"  (1.6 m)   Wt 157 lb 14.4 oz (71.6 kg)   SpO2 97%   BMI 27.97 kg/m     Wt Readings from Last 3 Encounters:  04/07/23 157 lb 14.4 oz (71.6 kg)  04/06/23 157 lb (71.2 kg)  03/05/23 158 lb (71.7 kg)     GEN:  Well nourished, well developed in no acute distress HEENT: Normal NECK: No JVD CARDIAC: RRR,  2/6 systolic heart murmur loudest at the RUSB RESPIRATORY:  Clear to auscultation without rales, wheezing or rhonchi  ABDOMEN: Soft, non-tender, non-distended MUSCULOSKELETAL:  No edema; No deformity  SKIN: Warm and dry NEUROLOGIC:  Alert and oriented x 3 PSYCHIATRIC:  Normal affect   ASSESSMENT:    1. Severe aortic stenosis   2. Heart block AV second degree   3. Chronic combined systolic and diastolic heart failure (HCC)   4. Essential hypertension   5. Pre-procedure lab exam       PLAN:    Aortic stenosis: Echocardiogram on 01/16/2021 showed EF 40 to 45%, mild RV dysfunction, moderate to severe aortic stenosis (V-max 3.0 m/s, mean gradient 20 mmHg, AVA 0.8 cm, DI 0.24).  AV calcium score only 365 on 01/16/21, argues against severe AS.  LHC/RHC on 02/13/21 showed no evidence of CAD, moderate to severe aortic stenosis (mean gradient 27 mmHg, AVA 1.1 cm), RA 1, RV 19/0, PA 20/8/12, PW CP 8, LVEDP 8, CI 3.2.  CTA shows tricuspid aortic valve with partial fusion of RCC/LCC, possibly a forme fruste bicuspid aortic valve.  Echocardiogram on 07/16/2021 showed EF had improved to 65 to 70% and aortic stenosis was mild to moderate (V-max 2.8 m/s, mean gradient 18 mmHg, AVA 1.3  cm).  Echocardiogram 06/2022 showed an EF 60 to 65%, normal RV function, moderate to severe aortic stenosis (Vmax 3.0 m/s, MG 19 mmHg, AVA 0.8 cm^2, DI 0.27).  Echocardiogram 10/2022 showed severe  aortic stenosis (mean gradient 26 mmHg, AVA 0.8 cm, DI 0.25), EF 55 to 60%, normal RV function.  -She was seen by Dr. Clifton James in structural heart clinic and Dr. Leafy Ro in cardiac surgery.  SAVR recommended but decision was to hold off as she was minimally symptomatic.  She is now reporting she is having dyspnea on exertion, reports short of breath walking up a flight of stairs, which is new for her.  In addition she developed complete heart block and underwent PPM.  Dr. Leafy Ro planning AVR next month, requesting LHC prior.  Will schedule.  Risks and benefits of cardiac catheterization have been discussed with the patient.  These include bleeding, infection, kidney damage, stroke, heart attack, death.  The patient understands these risks and is willing to proceed.   Chronic combined systolic and diastolic heart failure:  new diagnosis.  Echocardiogram on 01/16/2021 showed EF 40 to 45%, mild RV dysfunction, moderate to severe aortic stenosis (V-max 3.0 m/s, mean gradient 20 mmHg, AVA 0.8 cm, DI 0.24).  LHC/RHC on 02/13/21 showed no evidence of CAD, moderate to severe aortic stenosis (mean gradient 27 mmHg, AVA 1.1 cm), RA 1, RV 19/0, PA 20/8/12, PW CP 8, LVEDP 8, CI 3.2.  CTA shows tricuspid aortic valve with partial fusion of RCC/LCC, possibly a forme fruste bicuspid aortic valve.  Echocardiogram on 07/16/2021 showed EF had improved to 65 to 70% and aortic stenosis was mild to moderate (V-max 2.8 m/s, mean gradient 18 mmHg, AVA 1.3 cm). -Coreg was discontinued when having bradycardia -Continue losartan 25 mg daily -Discussed cardiac MRI for work-up nonischemic cardiomyopathy, but patient reports she has severe claustrophobia and is unable to tolerate MRI.  Will hold off given improvement in systolic  function  Heart block: She was found to have heart block and underwent Zio patch x 3 days which showed 2030 episodes of AV block (second-degree Mobitz 2, high-grade and third-degree). Had third-degree AV block with heart rate down to 36 bpm.  Suspect due to her severe calcific aortic stenosis.  Underwent PPM placement with Dr. Graciela Husbands on 01/21/2023.  Hyperlipidemia: LDL 148 on 11/29/21.  10 year ASCVD risk score 7%, borderline for meeting indication to start statin.  Caclium score 0 on 01/16/21  Hypertension: On losartan, appears controlled   RTC in 3 months   Medication Adjustments/Labs and Tests Ordered: Current medicines are reviewed at length with the patient today.  Concerns regarding medicines are outlined above.  Orders Placed This Encounter  Procedures   CBC with Differential/Platelet   Basic metabolic panel   EKG 12-Lead     Meds ordered this encounter  Medications   aspirin EC 81 MG tablet    Sig: Take 1 tablet morning of procedure    Dispense:  32 tablet    Refill:  0    Order Specific Question:   Lot Number?    Answer:   [id number]    Order Specific Question:   Expiration Date?    Answer:   09/19/2023       Patient Instructions  Medication Instructions:  TAKE 1 ASPIRIN MORNING OF CATH   *If you need a refill on your cardiac medications before your next appointment, please call your pharmacy*  Lab Work: CBC/BMET TODAY   If you have labs (blood work) drawn today and your tests are completely normal, you will receive your results only by: MyChart Message (if you have MyChart) OR A paper copy in the mail If you have any lab test that is abnormal  or we need to change your treatment, we will call you to review the results.  Testing/Procedures: Your physician has requested that you have a cardiac catheterization. Cardiac catheterization is used to diagnose and/or treat various heart conditions. Doctors may recommend this procedure for a number of different reasons. The  most common reason is to evaluate chest pain. Chest pain can be a symptom of coronary artery disease (CAD), and cardiac catheterization can show whether plaque is narrowing or blocking your heart's arteries. This procedure is also used to evaluate the valves, as well as measure the blood flow and oxygen levels in different parts of your heart. For further information please visit [email or web address] Please follow instruction sheet, as given.  Follow-Up: At [facility], you and your health needs are our priority.  As part of our continuing mission to provide you with exceptional heart care, we have created designated Provider Care Teams.  These Care Teams include your primary Cardiologist (physician) and Advanced Practice Providers (APPs -  Physician Assistants and Nurse Practitioners) who all work together to provide you with the care you need, when you need it.  We recommend signing up for the patient portal called "MyChart".  Sign up information is provided on this After Visit Summary.  MyChart is used to connect with patients for Virtual Visits (Telemedicine).  Patients are able to view lab/test results, encounter notes, upcoming appointments, etc.  Non-urgent messages can be sent to your provider as well.   To learn more about what you can do with MyChart, go to [email or web address].    Your next appointment:   3 month(s)  Provider:   DR Bjorn Pippin   Other Instructions  You are scheduled for a Cardiac Catheterization on Tuesday, September 24 with Dr. Peter Swaziland.  1. Please arrive at the [facility] (Main Entrance A) at [facility]: [address], [place] [address] at 8:30 AM (This time is  2 hour(s) before your procedure to ensure your preparation). Free valet parking service is available. You will check in at ADMITTING. The support person will be asked to wait in the waiting room.  It is OK to have someone drop you off and come back when you are ready to be  discharged.    Special note: Every effort is made to have your procedure done on time. Please understand that emergencies sometimes delay scheduled procedures.  2. Diet: Do not eat solid foods after midnight.  The patient may have clear liquids until 5am upon the day of the procedure.  3. Labs: You will need to have blood drawn today   4. Medication instructions in preparation for your procedure:   Contrast Allergy: No  On the morning of your procedure, take your Aspirin 81 mg and any morning medicines NOT listed above.  You may use sips of water.  5. Plan to go home the same day, you will only stay overnight if medically necessary. 6. Bring a current list of your medications and current insurance cards. 7. You MUST have a responsible person to drive you home. 8. Someone MUST be with you the first 24 hours after you arrive home or your discharge will be delayed. 9. Please wear clothes that are easy to get on and off and wear slip-on shoes.  Thank you for allowing [name] to care for you!   -- [facility] Invasive Cardiovascular services     Signed, Little Ishikawa, MD  04/07/2023 12:51 PM    [facility]  HeartCare

## 2023-04-07 ENCOUNTER — Encounter (HOSPITAL_BASED_OUTPATIENT_CLINIC_OR_DEPARTMENT_OTHER): Payer: Self-pay | Admitting: Cardiology

## 2023-04-07 ENCOUNTER — Ambulatory Visit (HOSPITAL_BASED_OUTPATIENT_CLINIC_OR_DEPARTMENT_OTHER): Payer: Medicare Other | Admitting: Cardiology

## 2023-04-07 VITALS — BP 115/76 | HR 77 | Ht 63.0 in | Wt 157.9 lb

## 2023-04-07 DIAGNOSIS — I5042 Chronic combined systolic (congestive) and diastolic (congestive) heart failure: Secondary | ICD-10-CM | POA: Diagnosis not present

## 2023-04-07 DIAGNOSIS — I441 Atrioventricular block, second degree: Secondary | ICD-10-CM

## 2023-04-07 DIAGNOSIS — I350 Nonrheumatic aortic (valve) stenosis: Secondary | ICD-10-CM

## 2023-04-07 DIAGNOSIS — I10 Essential (primary) hypertension: Secondary | ICD-10-CM

## 2023-04-07 DIAGNOSIS — Z01812 Encounter for preprocedural laboratory examination: Secondary | ICD-10-CM

## 2023-04-07 LAB — CBC WITH DIFFERENTIAL/PLATELET
Basophils Absolute: 0.1 10*3/uL (ref 0.0–0.2)
Basos: 1 %
EOS (ABSOLUTE): 0.1 10*3/uL (ref 0.0–0.4)
Eos: 2 %
Hematocrit: 41.4 % (ref 34.0–46.6)
Hemoglobin: 13.4 g/dL (ref 11.1–15.9)
Immature Grans (Abs): 0 10*3/uL (ref 0.0–0.1)
Immature Granulocytes: 0 %
Lymphocytes Absolute: 2 10*3/uL (ref 0.7–3.1)
Lymphs: 34 %
MCH: 28.1 pg (ref 26.6–33.0)
MCHC: 32.4 g/dL (ref 31.5–35.7)
MCV: 87 fL (ref 79–97)
Monocytes Absolute: 0.6 10*3/uL (ref 0.1–0.9)
Monocytes: 10 %
Neutrophils Absolute: 3 10*3/uL (ref 1.4–7.0)
Neutrophils: 53 %
Platelets: 341 10*3/uL (ref 150–450)
RBC: 4.77 x10E6/uL (ref 3.77–5.28)
RDW: 13.6 % (ref 11.7–15.4)
WBC: 5.7 10*3/uL (ref 3.4–10.8)

## 2023-04-07 LAB — BASIC METABOLIC PANEL
BUN/Creatinine Ratio: 22 (ref 12–28)
BUN: 22 mg/dL (ref 8–27)
CO2: 22 mmol/L (ref 20–29)
Calcium: 10.1 mg/dL (ref 8.7–10.3)
Chloride: 101 mmol/L (ref 96–106)
Creatinine, Ser: 1.02 mg/dL — ABNORMAL HIGH (ref 0.57–1.00)
Glucose: 113 mg/dL — ABNORMAL HIGH (ref 70–99)
Potassium: 4.6 mmol/L (ref 3.5–5.2)
Sodium: 138 mmol/L (ref 134–144)
eGFR: 60 mL/min/{1.73_m2} (ref >59)

## 2023-04-07 MED ORDER — ASPIRIN 81 MG PO TBEC: DELAYED_RELEASE_TABLET | ORAL | 0 refills | Status: DC

## 2023-04-07 NOTE — Patient Instructions (Addendum)
Medication Instructions:  TAKE 1 ASPIRIN MORNING OF CATH   *If you need a refill on your cardiac medications before your next appointment, please call your pharmacy*  Lab Work: CBC/BMET TODAY   If you have labs (blood work) drawn today and your tests are completely normal, you will receive your results only by: MyChart Message (if you have MyChart) OR A paper copy in the mail If you have any lab test that is abnormal or we need to change your treatment, we will call you to review the results.  Testing/Procedures: Your physician has requested that you have a cardiac catheterization. Cardiac catheterization is used to diagnose and/or treat various heart conditions. Doctors may recommend this procedure for a number of different reasons. The most common reason is to evaluate chest pain. Chest pain can be a symptom of coronary artery disease (CAD), and cardiac catheterization can show whether plaque is narrowing or blocking your heart's arteries. This procedure is also used to evaluate the valves, as well as measure the blood flow and oxygen levels in different parts of your heart. For further information please visit [email or web address] Please follow instruction sheet, as given.  Follow-Up: At [facility], you and your health needs are our priority.  As part of our continuing mission to provide you with exceptional heart care, we have created designated Provider Care Teams.  These Care Teams include your primary Cardiologist (physician) and Advanced Practice Providers (APPs -  Physician Assistants and Nurse Practitioners) who all work together to provide you with the care you need, when you need it.  We recommend signing up for the patient portal called "MyChart".  Sign up information is provided on this After Visit Summary.  MyChart is used to connect with patients for Virtual Visits (Telemedicine).  Patients are able to view lab/test results, encounter notes, upcoming appointments, etc.   Non-urgent messages can be sent to your provider as well.   To learn more about what you can do with MyChart, go to [email or web address].    Your next appointment:   3 month(s)  Provider:   DR Bjorn Pippin   Other Instructions  You are scheduled for a Cardiac Catheterization on Tuesday, September 24 with Dr. Peter Swaziland.  1. Please arrive at the [facility] (Main Entrance A) at [facility]: [address] [place], [place] [address] at 8:30 AM (This time is  2 hour(s) before your procedure to ensure your preparation). Free valet parking service is available. You will check in at ADMITTING. The support person will be asked to wait in the waiting room.  It is OK to have someone drop you off and come back when you are ready to be discharged.    Special note: Every effort is made to have your procedure done on time. Please understand that emergencies sometimes delay scheduled procedures.  2. Diet: Do not eat solid foods after midnight.  The patient may have clear liquids until 5am upon the day of the procedure.  3. Labs: You will need to have blood drawn today   4. Medication instructions in preparation for your procedure:   Contrast Allergy: No  On the morning of your procedure, take your Aspirin 81 mg and any morning medicines NOT listed above.  You may use sips of water.  5. Plan to go home the same day, you will only stay overnight if medically necessary. 6. Bring a current list of your medications and current insurance cards. 7. You MUST have a responsible person to  drive you home. 8. Someone MUST be with you the first 24 hours after you arrive home or your discharge will be delayed. 9. Please wear clothes that are easy to get on and off and wear slip-on shoes.  Thank you for allowing [name] to care for you!   -- [facility] Cardiovascular services

## 2023-04-13 ENCOUNTER — Telehealth: Payer: Self-pay | Admitting: *Deleted

## 2023-04-13 ENCOUNTER — Ambulatory Visit
Admission: RE | Admit: 2023-04-13 | Discharge: 2023-04-13 | Disposition: A | Discharge status: Home / Self Care | Payer: Medicare Other | Source: Ambulatory Visit | Attending: Family Medicine | Admitting: Family Medicine

## 2023-04-13 DIAGNOSIS — Z1231 Encounter for screening mammogram for malignant neoplasm of breast: Secondary | ICD-10-CM

## 2023-04-13 NOTE — Telephone Encounter (Addendum)
Cardiac Catheterization scheduled at [facility] for: Tuesday April 14, 2023 10:30 AM Arrival time [facility] Main Entrance A at: 8:30 AM  Nothing to eat after midnight prior to procedure, clear liquids until 5 AM day of procedure  Medication instructions: -Usual morning medications can be taken with sips of water including aspirin 81 mg.  Plan to go home the same day, you will only stay overnight if medically necessary.  You must have responsible adult to drive you home.  Someone must be with you the first 24 hours after you arrive home.  Reviewed procedure instructions with patient

## 2023-04-13 NOTE — Telephone Encounter (Signed)
Pt returning call

## 2023-04-13 NOTE — Telephone Encounter (Signed)
Reviewed procedure instructions with patient.

## 2023-04-14 ENCOUNTER — Ambulatory Visit (HOSPITAL_COMMUNITY)
Admission: RE | Disposition: A | Discharge status: Home / Self Care | Payer: Self-pay | Source: Home / Self Care | Attending: Cardiology

## 2023-04-14 ENCOUNTER — Ambulatory Visit (HOSPITAL_COMMUNITY)
Admission: RE | Admit: 2023-04-14 | Discharge: 2023-04-14 | Disposition: A | Discharge status: Home / Self Care | Payer: Medicare Other | Attending: Cardiology | Admitting: Cardiology

## 2023-04-14 ENCOUNTER — Other Ambulatory Visit: Payer: Self-pay | Admitting: Cardiology

## 2023-04-14 DIAGNOSIS — I5042 Chronic combined systolic (congestive) and diastolic (congestive) heart failure: Secondary | ICD-10-CM | POA: Insufficient documentation

## 2023-04-14 DIAGNOSIS — E785 Hyperlipidemia, unspecified: Secondary | ICD-10-CM | POA: Diagnosis not present

## 2023-04-14 DIAGNOSIS — Z853 Personal history of malignant neoplasm of breast: Secondary | ICD-10-CM | POA: Diagnosis not present

## 2023-04-14 DIAGNOSIS — I350 Nonrheumatic aortic (valve) stenosis: Secondary | ICD-10-CM | POA: Diagnosis not present

## 2023-04-14 DIAGNOSIS — I2510 Atherosclerotic heart disease of native coronary artery without angina pectoris: Secondary | ICD-10-CM | POA: Insufficient documentation

## 2023-04-14 DIAGNOSIS — I272 Pulmonary hypertension, unspecified: Secondary | ICD-10-CM | POA: Insufficient documentation

## 2023-04-14 DIAGNOSIS — I110 Hypertensive heart disease with heart failure: Secondary | ICD-10-CM | POA: Insufficient documentation

## 2023-04-14 DIAGNOSIS — Z79899 Other long term (current) drug therapy: Secondary | ICD-10-CM | POA: Diagnosis not present

## 2023-04-14 DIAGNOSIS — Z8249 Family history of ischemic heart disease and other diseases of the circulatory system: Secondary | ICD-10-CM | POA: Insufficient documentation

## 2023-04-14 DIAGNOSIS — I442 Atrioventricular block, complete: Secondary | ICD-10-CM | POA: Insufficient documentation

## 2023-04-14 HISTORY — PX: RIGHT/LEFT HEART CATH AND CORONARY ANGIOGRAPHY: CATH118266

## 2023-04-14 LAB — POCT I-STAT 7, (LYTES, BLD GAS, ICA,H+H)
Acid-base deficit: 2 mmol/L (ref 0.0–2.0)
Bicarbonate: 23.8 mmol/L (ref 20.0–28.0)
Calcium, Ion: 1.24 mmol/L (ref 1.15–1.40)
HCT: 33 % — ABNORMAL LOW (ref 36.0–46.0)
Hemoglobin: 11.2 g/dL — ABNORMAL LOW (ref 12.0–15.0)
O2 Saturation: 98 %
Potassium: 4 mmol/L (ref 3.5–5.1)
Sodium: 141 mmol/L (ref 135–145)
TCO2: 25 mmol/L (ref 22–32)
pCO2 arterial: 45.9 mmHg (ref 32–48)
pH, Arterial: 7.323 — ABNORMAL LOW (ref 7.35–7.45)
pO2, Arterial: 113 mmHg — ABNORMAL HIGH (ref 83–108)

## 2023-04-14 LAB — POCT I-STAT EG7
Acid-base deficit: 2 mmol/L (ref 0.0–2.0)
Acid-base deficit: 2 mmol/L (ref 0.0–2.0)
Bicarbonate: 24.1 mmol/L (ref 20.0–28.0)
Bicarbonate: 24.3 mmol/L (ref 20.0–28.0)
Calcium, Ion: 1.24 mmol/L (ref 1.15–1.40)
Calcium, Ion: 1.25 mmol/L (ref 1.15–1.40)
HCT: 34 % — ABNORMAL LOW (ref 36.0–46.0)
HCT: 34 % — ABNORMAL LOW (ref 36.0–46.0)
Hemoglobin: 11.6 g/dL — ABNORMAL LOW (ref 12.0–15.0)
Hemoglobin: 11.6 g/dL — ABNORMAL LOW (ref 12.0–15.0)
O2 Saturation: 69 %
O2 Saturation: 72 %
Potassium: 4.1 mmol/L (ref 3.5–5.1)
Potassium: 4.1 mmol/L (ref 3.5–5.1)
Sodium: 141 mmol/L (ref 135–145)
Sodium: 141 mmol/L (ref 135–145)
TCO2: 25 mmol/L (ref 22–32)
TCO2: 26 mmol/L (ref 22–32)
pCO2, Ven: 45.4 mmHg (ref 44–60)
pCO2, Ven: 45.9 mmHg (ref 44–60)
pH, Ven: 7.332 (ref 7.25–7.43)
pH, Ven: 7.332 (ref 7.25–7.43)
pO2, Ven: 39 mmHg (ref 32–45)
pO2, Ven: 40 mmHg (ref 32–45)

## 2023-04-14 SURGERY — RIGHT/LEFT HEART CATH AND CORONARY ANGIOGRAPHY
Anesthesia: LOCAL

## 2023-04-14 MED ORDER — HEPARIN (PORCINE) IN NACL 1000-0.9 UT/500ML-% IV SOLN
INTRAVENOUS | Status: DC | PRN
  Administered 2023-04-14 (×2): 500 mL

## 2023-04-14 MED ORDER — ACETAMINOPHEN 325 MG PO TABS: 650.0000 mg | ORAL_TABLET | ORAL | Status: DC | PRN

## 2023-04-14 MED ORDER — FENTANYL CITRATE (PF) 100 MCG/2ML IJ SOLN
INTRAMUSCULAR | Status: AC
  Filled 2023-04-14: qty 2

## 2023-04-14 MED ORDER — HEPARIN SODIUM (PORCINE) 1000 UNIT/ML IJ SOLN
INTRAMUSCULAR | Status: AC
  Filled 2023-04-14: qty 10

## 2023-04-14 MED ORDER — SODIUM CHLORIDE 0.9 % WEIGHT BASED INFUSION
3.0000 mL/kg/h | INTRAVENOUS | Status: AC
  Administered 2023-04-14: 3 mL/kg/h via INTRAVENOUS

## 2023-04-14 MED ORDER — LIDOCAINE HCL (PF) 1 % IJ SOLN
INTRAMUSCULAR | Status: AC
  Filled 2023-04-14: qty 30

## 2023-04-14 MED ORDER — SODIUM CHLORIDE 0.9 % WEIGHT BASED INFUSION: 1.0000 mL/kg/h | INTRAVENOUS | Status: DC

## 2023-04-14 MED ORDER — IOHEXOL 350 MG/ML SOLN
INTRAVENOUS | Status: DC | PRN
  Administered 2023-04-14: 75 mL via INTRA_ARTERIAL

## 2023-04-14 MED ORDER — SODIUM CHLORIDE 0.9 % IV SOLN: 250.0000 mL | INTRAVENOUS | Status: DC | PRN

## 2023-04-14 MED ORDER — VERAPAMIL HCL 2.5 MG/ML IV SOLN
INTRAVENOUS | Status: AC
  Filled 2023-04-14: qty 2

## 2023-04-14 MED ORDER — FENTANYL CITRATE (PF) 100 MCG/2ML IJ SOLN
INTRAMUSCULAR | Status: DC | PRN
  Administered 2023-04-14: 25 ug via INTRAVENOUS

## 2023-04-14 MED ORDER — LIDOCAINE HCL (PF) 1 % IJ SOLN
INTRAMUSCULAR | Status: DC | PRN
  Administered 2023-04-14: 10 mL
  Administered 2023-04-14: 5 mL
  Administered 2023-04-14: 2 mL

## 2023-04-14 MED ORDER — MIDAZOLAM HCL 2 MG/2ML IJ SOLN
INTRAMUSCULAR | Status: DC | PRN
  Administered 2023-04-14: 1 mg via INTRAVENOUS

## 2023-04-14 MED ORDER — HYDRALAZINE HCL 20 MG/ML IJ SOLN: 10.0000 mg | INTRAMUSCULAR | Status: DC | PRN

## 2023-04-14 MED ORDER — SODIUM CHLORIDE 0.9% FLUSH: 3.0000 mL | INTRAVENOUS | Status: DC | PRN

## 2023-04-14 MED ORDER — SODIUM CHLORIDE 0.9% FLUSH: 3.0000 mL | Freq: Two times a day (BID) | INTRAVENOUS | Status: DC

## 2023-04-14 MED ORDER — ASPIRIN 81 MG PO CHEW: 81.0000 mg | CHEWABLE_TABLET | ORAL | Status: DC

## 2023-04-14 MED ORDER — ONDANSETRON HCL 4 MG/2ML IJ SOLN: 4.0000 mg | Freq: Four times a day (QID) | INTRAMUSCULAR | Status: DC | PRN

## 2023-04-14 MED ORDER — VERAPAMIL HCL 2.5 MG/ML IV SOLN
INTRAVENOUS | Status: DC | PRN
  Administered 2023-04-14: 10 mL via INTRA_ARTERIAL

## 2023-04-14 MED ORDER — MIDAZOLAM HCL 2 MG/2ML IJ SOLN
INTRAMUSCULAR | Status: AC
  Filled 2023-04-14: qty 2

## 2023-04-14 SURGICAL SUPPLY — 20 items
CATH 5FR JL3.5 JR4 ANG PIG MP (CATHETERS) IMPLANT
CATH BALLN WEDGE 5F 110CM (CATHETERS) IMPLANT
CLOSURE MYNX CONTROL 5F (Vascular Products) IMPLANT
DEVICE RAD COMP TR BAND LRG (VASCULAR PRODUCTS) IMPLANT
GLIDESHEATH SLEND SS 6F .021 (SHEATH) IMPLANT
GUIDEWIRE INQWIRE 1.5J.035X260 (WIRE) IMPLANT
INQWIRE 1.5J .035X260CM (WIRE) ×1
KIT HEART LEFT (KITS) IMPLANT
KIT MICROPUNCTURE NIT STIFF (SHEATH) IMPLANT
PACK CARDIAC CATHETERIZATION (CUSTOM PROCEDURE TRAY) ×2 IMPLANT
SHEATH GLIDE SLENDER 4/5FR (SHEATH) IMPLANT
SHEATH PINNACLE 5F 10CM (SHEATH) IMPLANT
SHEATH PROBE COVER 6X72 (BAG) IMPLANT
TRANSDUCER W/STOPCOCK (MISCELLANEOUS) IMPLANT
TUBING CIL FLEX 10 FLL-RA (TUBING) IMPLANT
WIRE ASAHI PROWATER 180CM (WIRE) IMPLANT
WIRE EMERALD 3MM-J .025X260CM (WIRE) IMPLANT
WIRE EMERALD 3MM-J .035X150CM (WIRE) IMPLANT
WIRE EMERALD ST .035X150CM (WIRE) IMPLANT
WIRE HI TORQ VERSACORE-J 145CM (WIRE) IMPLANT

## 2023-04-14 NOTE — Interval H&P Note (Signed)
History and Physical Interval Note:  04/14/2023 8:16 AM  [name]  has presented today for surgery, with the diagnosis of aortic stenosis.  The various methods of treatment have been discussed with the patient and family. After consideration of risks, benefits and other options for treatment, the patient has consented to  Procedure(s): RIGHT/LEFT HEART CATH AND CORONARY ANGIOGRAPHY (N/A) as a surgical intervention.  The patient's history has been reviewed, patient examined, no change in status, stable for surgery.  I have reviewed the patient's chart and labs.  Questions were answered to the patient's satisfaction.     Theron Arista [facility] 04/14/2023 8:16 AM

## 2023-04-15 ENCOUNTER — Other Ambulatory Visit: Payer: Self-pay | Admitting: *Deleted

## 2023-04-15 ENCOUNTER — Encounter: Payer: Self-pay | Admitting: Cardiology

## 2023-04-15 ENCOUNTER — Encounter (HOSPITAL_COMMUNITY): Payer: Self-pay | Admitting: Cardiology

## 2023-04-15 DIAGNOSIS — I350 Nonrheumatic aortic (valve) stenosis: Secondary | ICD-10-CM

## 2023-04-15 MED FILL — Heparin Sodium (Porcine) Inj 1000 Unit/ML: INTRAMUSCULAR | Qty: 10 | Status: AC

## 2023-04-22 ENCOUNTER — Encounter (HOSPITAL_COMMUNITY): Payer: Medicare Other | Attending: Cardiology

## 2023-04-22 DIAGNOSIS — I441 Atrioventricular block, second degree: Secondary | ICD-10-CM | POA: Diagnosis not present

## 2023-04-22 LAB — CUP PACEART REMOTE DEVICE CHECK
Battery Remaining Longevity: 152 mo
Battery Voltage: 3.15 V
Brady Statistic AP VP Percent: 4.66 %
Brady Statistic AP VS Percent: 0.1 %
Brady Statistic AS VP Percent: 94.9 %
Brady Statistic AS VS Percent: 0.34 %
Brady Statistic RA Percent Paced: 4.78 %
Brady Statistic RV Percent Paced: 99.56 %
Date Time Interrogation Session: 20241001210847
Implantable Lead Connection Status: 753985
Implantable Lead Connection Status: 753985
Implantable Lead Implant Date: 20240703
Implantable Lead Implant Date: 20240703
Implantable Lead Location: 753859
Implantable Lead Location: 753860
Implantable Lead Model: 3830
Implantable Lead Model: 5076
Implantable Pulse Generator Implant Date: 20240703
Lead Channel Impedance Value: 342 Ohm
Lead Channel Impedance Value: 418 Ohm
Lead Channel Impedance Value: 589 Ohm
Lead Channel Impedance Value: 627 Ohm
Lead Channel Pacing Threshold Amplitude: 0.875 V
Lead Channel Pacing Threshold Amplitude: 1 V
Lead Channel Pacing Threshold Pulse Width: 0.4 ms
Lead Channel Pacing Threshold Pulse Width: 0.4 ms
Lead Channel Sensing Intrinsic Amplitude: 10.5 mV
Lead Channel Sensing Intrinsic Amplitude: 10.5 mV
Lead Channel Sensing Intrinsic Amplitude: 2.5 mV
Lead Channel Sensing Intrinsic Amplitude: 2.5 mV
Lead Channel Setting Pacing Amplitude: 2 V
Lead Channel Setting Pacing Amplitude: 2.25 V
Lead Channel Setting Pacing Pulse Width: 0.4 ms
Lead Channel Setting Sensing Sensitivity: 1.2 mV
Zone Setting Status: 755011

## 2023-04-27 ENCOUNTER — Encounter: Payer: Medicare Other | Admitting: Internal Medicine

## 2023-04-30 ENCOUNTER — Encounter: Payer: Self-pay | Admitting: Cardiology

## 2023-05-01 ENCOUNTER — Encounter: Payer: Self-pay | Admitting: Internal Medicine

## 2023-05-01 NOTE — Pre-Procedure Instructions (Signed)
Surgical Instructions   Your procedure is scheduled on May 06, 2023. Report to [facility] Main Entrance "A" at 6:30 A.M., then check in with the Admitting office. Any questions or running late day of surgery: call [phone]  Questions prior to your surgery date: call [phone], Monday-Friday, 8am-4pm. If you experience any cold or flu symptoms such as cough, fever, chills, shortness of breath, etc. between now and your scheduled surgery, please notify us at the above number.     Remember:  Do not eat or drink after midnight the night before your surgery    Take these medicines the morning of surgery with A SIP OF WATER: escitalopram (LEXAPRO)  rosuvastatin (CRESTOR)    May take these medicines IF NEEDED: acetaminophen (TYLENOL)  Polyethyl Glycol-Propyl Glycol (LUBRICATING EYE DROPS OP) eye drops   Continue to take your aspirin through the day before surgery. DO NOT take any the morning of surgery.   One week prior to surgery, STOP taking any Aleve, Naproxen, Ibuprofen, Motrin, Advil, Goody's, BC's, all herbal medications, fish oil, and non-prescription vitamins.                     Do NOT Smoke (Tobacco/Vaping) for 24 hours prior to your procedure.  If you use a CPAP at night, you may bring your mask/headgear for your overnight stay.   You will be asked to remove any contacts, glasses, piercing's, hearing aid's, dentures/partials prior to surgery. Please bring cases for these items if needed.    Patients discharged the day of surgery will not be allowed to drive home, and someone needs to stay with them for 24 hours.  SURGICAL WAITING ROOM VISITATION Patients may have no more than 2 support people in the waiting area - these visitors may rotate.   Pre-op nurse will coordinate an appropriate time for 1 ADULT support person, who may not rotate, to accompany patient in pre-op.  Children under the age of 81 must have an adult with them who is not the patient and must  remain in the main waiting area with an adult.  If the patient needs to stay at the hospital during part of their recovery, the visitor guidelines for inpatient rooms apply.  Please refer to the [facility] website for the visitor guidelines for any additional information.   If you received a COVID test during your pre-op visit  it is requested that you wear a mask when out in public, stay away from anyone that may not be feeling well and notify your surgeon if you develop symptoms. If you have been in contact with anyone that has tested positive in the last 10 days please notify you surgeon.      Pre-operative CHG Bathing Instructions   You can play a key role in reducing the risk of infection after surgery. Your skin needs to be as free of germs as possible. You can reduce the number of germs on your skin by washing with CHG (chlorhexidine gluconate) soap before surgery. CHG is an antiseptic soap that kills germs and continues to kill germs even after washing.   DO NOT use if you have an allergy to chlorhexidine/CHG or antibacterial soaps. If your skin becomes reddened or irritated, stop using the CHG and notify one of our RNs at [phone].              TAKE A SHOWER THE NIGHT BEFORE SURGERY AND THE DAY OF SURGERY    Please keep in mind the  following:  DO NOT shave, including legs and underarms, 48 hours prior to surgery.   You may shave your face before/day of surgery.  Place clean sheets on your bed the night before surgery Use a clean washcloth (not used since being washed) for each shower. DO NOT sleep with pet's night before surgery.  CHG Shower Instructions:  Wash your face and private area with normal soap. If you choose to wash your hair, wash first with your normal shampoo.  After you use shampoo/soap, rinse your hair and body thoroughly to remove shampoo/soap residue.  Turn the water OFF and apply half the bottle of CHG soap to a CLEAN washcloth.  Apply CHG soap ONLY  FROM YOUR NECK DOWN TO YOUR TOES (washing for 3-5 minutes)  DO NOT use CHG soap on face, private areas, open wounds, or sores.  Pay special attention to the area where your surgery is being performed.  If you are having back surgery, having someone wash your back for you may be helpful. Wait 2 minutes after CHG soap is applied, then you may rinse off the CHG soap.  Pat dry with a clean towel  Put on clean pajamas    Additional instructions for the day of surgery: DO NOT APPLY any lotions, deodorants, cologne, or perfumes.   Do not wear jewelry or makeup Do not wear nail polish, gel polish, artificial nails, or any other type of covering on natural nails (fingers and toes) Do not bring valuables to the hospital. [facility] is not responsible for valuables/personal belongings. Put on clean/comfortable clothes.  Please brush your teeth.  Ask your nurse before applying any prescription medications to the skin.

## 2023-05-01 NOTE — Progress Notes (Signed)
PERIOPERATIVE PRESCRIPTION FOR IMPLANTED CARDIAC DEVICE PROGRAMMING  Patient Information: Name:  [name]  DOB:  12-30-1955  MRN:  [id number]    Planned Procedure:  Aortic Valve Replacement, CABG  Surgeon:  Dr. Eugenio Hoes  Date of Procedure:  05/06/2023  Cautery will be used.  Position during surgery:  Supine   Please send documentation back to:  [facility] (Fax # [phone])  Device Information:  Clinic EP Physician:  Sherryl Manges, MD   Device Type:  Pacemaker Manufacturer and Phone #:  Medtronic: [id number] Pacemaker Dependent?:  No. Date of Last Device Check:  04/21/2023 Normal Device Function?:  Yes.    Electrophysiologist's Recommendations:  Have magnet available. Provide continuous ECG monitoring when magnet is used or reprogramming is to be performed.  Procedure will likely interfere with device function.  Device should be programmed:  Asynchronous pacing during procedure and returned to normal programming after procedure  Per Device Clinic Standing Orders, Lenor Coffin, RN  1:07 PM 05/01/2023

## 2023-05-04 ENCOUNTER — Ambulatory Visit (HOSPITAL_COMMUNITY)
Admission: RE | Admit: 2023-05-04 | Discharge: 2023-05-04 | Disposition: A | Discharge status: Home / Self Care | Payer: Medicare Other | Source: Ambulatory Visit | Attending: Thoracic Surgery (Cardiothoracic Vascular Surgery) | Admitting: Thoracic Surgery (Cardiothoracic Vascular Surgery)

## 2023-05-04 ENCOUNTER — Ambulatory Visit (HOSPITAL_COMMUNITY)
Admission: RE | Admit: 2023-05-04 | Discharge: 2023-05-04 | Disposition: A | Discharge status: Home / Self Care | Payer: Medicare Other | Source: Ambulatory Visit | Attending: Thoracic Surgery (Cardiothoracic Vascular Surgery)

## 2023-05-04 ENCOUNTER — Other Ambulatory Visit: Payer: Self-pay

## 2023-05-04 ENCOUNTER — Encounter (HOSPITAL_COMMUNITY)
Admission: RE | Admit: 2023-05-04 | Discharge: 2023-05-04 | Disposition: A | Discharge status: Home / Self Care | Payer: Medicare Other | Source: Ambulatory Visit | Attending: Thoracic Surgery (Cardiothoracic Vascular Surgery) | Admitting: Thoracic Surgery (Cardiothoracic Vascular Surgery)

## 2023-05-04 ENCOUNTER — Encounter (HOSPITAL_COMMUNITY): Payer: Self-pay

## 2023-05-04 VITALS — BP 128/75 | HR 78 | Temp 97.8°F | Resp 17 | Ht 63.5 in | Wt 160.9 lb

## 2023-05-04 DIAGNOSIS — I110 Hypertensive heart disease with heart failure: Secondary | ICD-10-CM | POA: Diagnosis not present

## 2023-05-04 DIAGNOSIS — R918 Other nonspecific abnormal finding of lung field: Secondary | ICD-10-CM | POA: Diagnosis not present

## 2023-05-04 DIAGNOSIS — Z48812 Encounter for surgical aftercare following surgery on the circulatory system: Secondary | ICD-10-CM | POA: Diagnosis not present

## 2023-05-04 DIAGNOSIS — N200 Calculus of kidney: Secondary | ICD-10-CM | POA: Insufficient documentation

## 2023-05-04 DIAGNOSIS — I272 Pulmonary hypertension, unspecified: Secondary | ICD-10-CM | POA: Diagnosis not present

## 2023-05-04 DIAGNOSIS — I350 Nonrheumatic aortic (valve) stenosis: Secondary | ICD-10-CM

## 2023-05-04 DIAGNOSIS — Z8249 Family history of ischemic heart disease and other diseases of the circulatory system: Secondary | ICD-10-CM | POA: Diagnosis not present

## 2023-05-04 DIAGNOSIS — E785 Hyperlipidemia, unspecified: Secondary | ICD-10-CM | POA: Insufficient documentation

## 2023-05-04 DIAGNOSIS — Z853 Personal history of malignant neoplasm of breast: Secondary | ICD-10-CM | POA: Insufficient documentation

## 2023-05-04 DIAGNOSIS — Z9221 Personal history of antineoplastic chemotherapy: Secondary | ICD-10-CM | POA: Diagnosis not present

## 2023-05-04 DIAGNOSIS — Z1721 Progesterone receptor positive status: Secondary | ICD-10-CM | POA: Diagnosis not present

## 2023-05-04 DIAGNOSIS — Z952 Presence of prosthetic heart valve: Secondary | ICD-10-CM | POA: Diagnosis not present

## 2023-05-04 DIAGNOSIS — J9811 Atelectasis: Secondary | ICD-10-CM | POA: Diagnosis not present

## 2023-05-04 DIAGNOSIS — Z9911 Dependence on respirator [ventilator] status: Secondary | ICD-10-CM | POA: Diagnosis not present

## 2023-05-04 DIAGNOSIS — M1990 Unspecified osteoarthritis, unspecified site: Secondary | ICD-10-CM | POA: Diagnosis not present

## 2023-05-04 DIAGNOSIS — Z01818 Encounter for other preprocedural examination: Secondary | ICD-10-CM

## 2023-05-04 DIAGNOSIS — K449 Diaphragmatic hernia without obstruction or gangrene: Secondary | ICD-10-CM | POA: Insufficient documentation

## 2023-05-04 DIAGNOSIS — Z1731 Human epidermal growth factor receptor 2 positive status: Secondary | ICD-10-CM | POA: Diagnosis not present

## 2023-05-04 DIAGNOSIS — Z79899 Other long term (current) drug therapy: Secondary | ICD-10-CM | POA: Diagnosis not present

## 2023-05-04 DIAGNOSIS — Z951 Presence of aortocoronary bypass graft: Secondary | ICD-10-CM | POA: Diagnosis not present

## 2023-05-04 DIAGNOSIS — I060 Rheumatic aortic stenosis: Secondary | ICD-10-CM | POA: Diagnosis not present

## 2023-05-04 DIAGNOSIS — Z1152 Encounter for screening for COVID-19: Secondary | ICD-10-CM | POA: Diagnosis not present

## 2023-05-04 DIAGNOSIS — I080 Rheumatic disorders of both mitral and aortic valves: Secondary | ICD-10-CM | POA: Diagnosis not present

## 2023-05-04 DIAGNOSIS — Z8052 Family history of malignant neoplasm of bladder: Secondary | ICD-10-CM | POA: Diagnosis not present

## 2023-05-04 DIAGNOSIS — Z4682 Encounter for fitting and adjustment of non-vascular catheter: Secondary | ICD-10-CM | POA: Diagnosis not present

## 2023-05-04 DIAGNOSIS — Z9889 Other specified postprocedural states: Secondary | ICD-10-CM | POA: Diagnosis not present

## 2023-05-04 DIAGNOSIS — E871 Hypo-osmolality and hyponatremia: Secondary | ICD-10-CM | POA: Diagnosis not present

## 2023-05-04 DIAGNOSIS — I509 Heart failure, unspecified: Secondary | ICD-10-CM | POA: Diagnosis not present

## 2023-05-04 DIAGNOSIS — K760 Fatty (change of) liver, not elsewhere classified: Secondary | ICD-10-CM | POA: Insufficient documentation

## 2023-05-04 DIAGNOSIS — Z923 Personal history of irradiation: Secondary | ICD-10-CM | POA: Diagnosis not present

## 2023-05-04 DIAGNOSIS — D62 Acute posthemorrhagic anemia: Secondary | ICD-10-CM | POA: Diagnosis not present

## 2023-05-04 DIAGNOSIS — Z801 Family history of malignant neoplasm of trachea, bronchus and lung: Secondary | ICD-10-CM | POA: Diagnosis not present

## 2023-05-04 DIAGNOSIS — R739 Hyperglycemia, unspecified: Secondary | ICD-10-CM | POA: Diagnosis not present

## 2023-05-04 DIAGNOSIS — Z170 Estrogen receptor positive status [ER+]: Secondary | ICD-10-CM | POA: Diagnosis not present

## 2023-05-04 DIAGNOSIS — I10 Essential (primary) hypertension: Secondary | ICD-10-CM | POA: Insufficient documentation

## 2023-05-04 DIAGNOSIS — Z452 Encounter for adjustment and management of vascular access device: Secondary | ICD-10-CM | POA: Diagnosis not present

## 2023-05-04 DIAGNOSIS — I4430 Unspecified atrioventricular block: Secondary | ICD-10-CM | POA: Diagnosis not present

## 2023-05-04 DIAGNOSIS — Z950 Presence of cardiac pacemaker: Secondary | ICD-10-CM | POA: Diagnosis not present

## 2023-05-04 DIAGNOSIS — I517 Cardiomegaly: Secondary | ICD-10-CM | POA: Diagnosis not present

## 2023-05-04 DIAGNOSIS — R0989 Other specified symptoms and signs involving the circulatory and respiratory systems: Secondary | ICD-10-CM | POA: Diagnosis not present

## 2023-05-04 DIAGNOSIS — I700 Atherosclerosis of aorta: Secondary | ICD-10-CM | POA: Insufficient documentation

## 2023-05-04 DIAGNOSIS — Z841 Family history of disorders of kidney and ureter: Secondary | ICD-10-CM | POA: Diagnosis not present

## 2023-05-04 DIAGNOSIS — I2510 Atherosclerotic heart disease of native coronary artery without angina pectoris: Secondary | ICD-10-CM | POA: Diagnosis not present

## 2023-05-04 HISTORY — DX: Cardiac murmur, unspecified: R01.1

## 2023-05-04 HISTORY — DX: Atherosclerotic heart disease of native coronary artery without angina pectoris: I25.10

## 2023-05-04 HISTORY — DX: Cardiac arrhythmia, unspecified: I49.9

## 2023-05-04 HISTORY — DX: Personal history of urinary calculi: Z87.442

## 2023-05-04 HISTORY — DX: Prediabetes: R73.03

## 2023-05-04 HISTORY — DX: Unspecified osteoarthritis, unspecified site: M19.90

## 2023-05-04 HISTORY — DX: Pneumonia, unspecified organism: J18.9

## 2023-05-04 HISTORY — DX: Anxiety disorder, unspecified: F41.9

## 2023-05-04 LAB — URINALYSIS, ROUTINE W REFLEX MICROSCOPIC
Bilirubin Urine: NEGATIVE
Glucose, UA: NEGATIVE mg/dL
Ketones, ur: NEGATIVE mg/dL
Nitrite: NEGATIVE
Protein, ur: NEGATIVE mg/dL
Specific Gravity, Urine: 1.021 (ref 1.005–1.030)
pH: 5 (ref 5.0–8.0)

## 2023-05-04 LAB — SARS CORONAVIRUS 2 (TAT 6-24 HRS): SARS Coronavirus 2: NEGATIVE

## 2023-05-04 LAB — CBC
HCT: 40.7 % (ref 36.0–46.0)
Hemoglobin: 13.3 g/dL (ref 12.0–15.0)
MCH: 28.3 pg (ref 26.0–34.0)
MCHC: 32.7 g/dL (ref 30.0–36.0)
MCV: 86.6 fL (ref 80.0–100.0)
Platelets: 333 10*3/uL (ref 150–400)
RBC: 4.7 MIL/uL (ref 3.87–5.11)
RDW: 13.7 % (ref 11.5–15.5)
WBC: 6.3 10*3/uL (ref 4.0–10.5)
nRBC: 0 % (ref 0.0–0.2)

## 2023-05-04 LAB — COMPREHENSIVE METABOLIC PANEL
ALT: 48 U/L — ABNORMAL HIGH (ref 0–44)
AST: 41 U/L (ref 15–41)
Albumin: 4.6 g/dL (ref 3.5–5.0)
Alkaline Phosphatase: 57 U/L (ref 38–126)
Anion gap: 11 (ref 5–15)
BUN: 19 mg/dL (ref 8–23)
CO2: 23 mmol/L (ref 22–32)
Calcium: 10.4 mg/dL — ABNORMAL HIGH (ref 8.9–10.3)
Chloride: 103 mmol/L (ref 98–111)
Creatinine, Ser: 1.13 mg/dL — ABNORMAL HIGH (ref 0.44–1.00)
GFR, Estimated: 53 mL/min — ABNORMAL LOW (ref >60)
Glucose, Bld: 113 mg/dL — ABNORMAL HIGH (ref 70–99)
Potassium: 4.5 mmol/L (ref 3.5–5.1)
Sodium: 137 mmol/L (ref 135–145)
Total Bilirubin: 1.1 mg/dL (ref 0.3–1.2)
Total Protein: 8.2 g/dL — ABNORMAL HIGH (ref 6.5–8.1)

## 2023-05-04 LAB — BLOOD GAS, ARTERIAL
Acid-base deficit: 0.1 mmol/L (ref 0.0–2.0)
Bicarbonate: 23.9 mmol/L (ref 20.0–28.0)
Drawn by: 58793
O2 Saturation: 95.7 %
Patient temperature: 37
pCO2 arterial: 36 mm[Hg] (ref 32–48)
pH, Arterial: 7.43 (ref 7.35–7.45)
pO2, Arterial: 72 mm[Hg] — ABNORMAL LOW (ref 83–108)

## 2023-05-04 LAB — HEMOGLOBIN A1C
Hgb A1c MFr Bld: 5.7 % — ABNORMAL HIGH (ref 4.8–5.6)
Mean Plasma Glucose: 116.89 mg/dL

## 2023-05-04 LAB — SURGICAL PCR SCREEN
MRSA, PCR: NEGATIVE
Staphylococcus aureus: NEGATIVE

## 2023-05-04 LAB — TYPE AND SCREEN
ABO/RH(D): O POS
Antibody Screen: NEGATIVE

## 2023-05-04 LAB — APTT: aPTT: 27 s (ref 24–36)

## 2023-05-04 LAB — PROTIME-INR
INR: 1 (ref 0.8–1.2)
Prothrombin Time: 13.6 s (ref 11.4–15.2)

## 2023-05-04 NOTE — Progress Notes (Signed)
PCP - Dr. Aliene Beams Cardiologist - Dr. Epifanio Lesches EP - Dr. Sherryl Manges  PPM/ICD - Medtronic PPM Device Orders - Device orders requested and placed in chart Rep Notified - Med[employer] rep notified 05/01/23.  Chest x-ray - 05/04/2023 EKG - 04/07/2023 Stress Test - Denies ECHO - 11/12/2022 Cardiac Cath - 04/14/2023  Sleep Study - Denies CPAP - n/a  No DM  Last dose of GLP1 agonist- n/a GLP1 instructions: n/a  Blood Thinner Instructions: n/a Aspirin Instructions: n/a  NPO after midnight  COVID TEST- Yes. Result pending.   Anesthesia review: Yes. Cardiac Hx with PPM for 2nd degree AV block.  Patient denies shortness of breath, fever, cough and chest pain at PAT appointment. Pt denies any respiratory illness/infection in the last two months.   All instructions explained to the patient, with a verbal understanding of the material. Patient agrees to go over the instructions while at home for a better understanding. Patient also instructed to self quarantine after being tested for COVID-19. The opportunity to ask questions was provided.

## 2023-05-04 NOTE — Progress Notes (Signed)
VASCULAR LAB    Carotid duplex has been performed.  See CV proc for preliminary results.   Jaquelin Meaney, RVT 05/04/2023, 10:43 AM

## 2023-05-05 MED ORDER — MAGNESIUM SULFATE 50 % IJ SOLN
40.0000 meq | INTRAMUSCULAR | Status: DC
  Filled 2023-05-05: qty 9.85

## 2023-05-05 MED ORDER — TRANEXAMIC ACID (OHS) PUMP PRIME SOLUTION
2.0000 mg/kg | INTRAVENOUS | Status: DC
  Filled 2023-05-05: qty 1.46

## 2023-05-05 MED ORDER — TRANEXAMIC ACID (OHS) BOLUS VIA INFUSION
15.0000 mg/kg | INTRAVENOUS | Status: AC
  Administered 2023-05-06: 1095 mg via INTRAVENOUS
  Filled 2023-05-05: qty 1095

## 2023-05-05 MED ORDER — POTASSIUM CHLORIDE 2 MEQ/ML IV SOLN
80.0000 meq | INTRAVENOUS | Status: DC
  Filled 2023-05-05: qty 40

## 2023-05-05 MED ORDER — DEXMEDETOMIDINE HCL IN NACL 400 MCG/100ML IV SOLN
0.1000 ug/kg/h | INTRAVENOUS | Status: AC
  Administered 2023-05-06: .7 ug/kg/h via INTRAVENOUS
  Filled 2023-05-05: qty 100

## 2023-05-05 MED ORDER — PLASMA-LYTE A IV SOLN
INTRAVENOUS | Status: DC
  Filled 2023-05-05: qty 2.5

## 2023-05-05 MED ORDER — VANCOMYCIN HCL 1000 MG IV SOLR
INTRAVENOUS | Status: DC
  Filled 2023-05-05: qty 20

## 2023-05-05 MED ORDER — NITROGLYCERIN IN D5W 200-5 MCG/ML-% IV SOLN
2.0000 ug/min | INTRAVENOUS | Status: DC
  Filled 2023-05-05: qty 250

## 2023-05-05 MED ORDER — HEPARIN 30,000 UNITS/1000 ML (OHS) CELLSAVER SOLUTION
Status: DC
  Filled 2023-05-05: qty 1000

## 2023-05-05 MED ORDER — MILRINONE LACTATE IN DEXTROSE 20-5 MG/100ML-% IV SOLN
0.3000 ug/kg/min | INTRAVENOUS | Status: DC
  Filled 2023-05-05: qty 100

## 2023-05-05 MED ORDER — TRANEXAMIC ACID 1000 MG/10ML IV SOLN
1.5000 mg/kg/h | INTRAVENOUS | Status: AC
  Administered 2023-05-06: 1.5 mg/kg/h via INTRAVENOUS
  Filled 2023-05-05: qty 25

## 2023-05-05 MED ORDER — INSULIN REGULAR(HUMAN) IN NACL 100-0.9 UT/100ML-% IV SOLN
INTRAVENOUS | Status: AC
  Administered 2023-05-06: .7 [IU]/h via INTRAVENOUS
  Filled 2023-05-05: qty 100

## 2023-05-05 MED ORDER — NOREPINEPHRINE 4 MG/250ML-% IV SOLN
0.0000 ug/min | INTRAVENOUS | Status: DC
  Filled 2023-05-05: qty 250

## 2023-05-05 MED ORDER — EPINEPHRINE HCL 5 MG/250ML IV SOLN IN NS
0.0000 ug/min | INTRAVENOUS | Status: DC
  Filled 2023-05-05: qty 250

## 2023-05-05 MED ORDER — PHENYLEPHRINE HCL-NACL 20-0.9 MG/250ML-% IV SOLN
30.0000 ug/min | INTRAVENOUS | Status: AC
  Administered 2023-05-06: 30 ug/min via INTRAVENOUS
  Filled 2023-05-05: qty 250

## 2023-05-05 MED ORDER — CEFAZOLIN SODIUM-DEXTROSE 2-4 GM/100ML-% IV SOLN
2.0000 g | INTRAVENOUS | Status: AC
  Administered 2023-05-06: 2 g via INTRAVENOUS
  Filled 2023-05-05: qty 100

## 2023-05-05 MED ORDER — CEFAZOLIN SODIUM-DEXTROSE 2-4 GM/100ML-% IV SOLN
2.0000 g | INTRAVENOUS | Status: AC
  Administered 2023-05-06: 2 g via INTRAVENOUS
  Filled 2023-05-05 (×2): qty 100

## 2023-05-05 MED ORDER — VANCOMYCIN HCL 1250 MG/250ML IV SOLN
1250.0000 mg | INTRAVENOUS | Status: AC
  Administered 2023-05-06: 1250 mg via INTRAVENOUS
  Filled 2023-05-05: qty 250

## 2023-05-05 NOTE — H&P (Signed)
[address]       [place] [address]             [phone]                                   [name] Medical Record #[id number] Date of Birth: 1955/11/24   Tasha Amabile, MD   Chief Complaint: Increasing DOE and fatigue     History of Present Illness:     Pt is a 67 yo female who I evaluated back in May for paradoxical low flow low gradient AS who had bicuspid valve and felt to have low coronary issues for TAVR who was not as symptomatic at that time and wished to hold off on surgery until later. Pt however was found to suffer heart block this summer needing PPM. She now has become very fatigued and with DOE that she is no longer able to do her activities she loved doing like taking walks and water aerobics. She was seen by Dr Clifton James and he felt she was symptomatic and needing SAVR to improve. She had a cath in 2022 without CAD             Past Medical History:  Diagnosis Date   Aortic valve stenosis     Breast cancer (HCC) 2004    right breast; ER/PR+, Her2+   Cancer (HCC) 10/2002    right breast lumpectomy   CHF (congestive heart failure) (HCC)     GERD (gastroesophageal reflux disease)     HTN (hypertension)     Hyperlipidemia     Morton neuroma     Personal history of chemotherapy 11/19/2002   Personal history of radiation therapy 02/19/2003               Past Surgical History:  Procedure Laterality Date   BREAST BIOPSY Right 2019   BREAST LUMPECTOMY Right     BREAST SURGERY   10/2002    lumpectomy / right side   PACEMAKER IMPLANT N/A 01/21/2023    Procedure: PACEMAKER IMPLANT;  Surgeon: Duke Salvia, MD;  Location: [facility] INVASIVE CV LAB;  Service: Cardiovascular;  Laterality: N/A;   RIGHT/LEFT HEART CATH AND CORONARY ANGIOGRAPHY N/A 02/13/2021    Procedure: RIGHT/LEFT HEART CATH AND CORONARY ANGIOGRAPHY;  Surgeon: Kathleene Hazel, MD;  Location: [facility] INVASIVE CV LAB;  Service:  Cardiovascular;  Laterality: N/A;          Tobacco Use History  Social History       Tobacco Use  Smoking Status Never  Smokeless Tobacco Never      Social History        Substance and Sexual Activity  Alcohol Use Yes   Alcohol/week: 1.0 - 2.0 standard drink of alcohol   Types: 1 - 2 Glasses of wine per week    Comment: occasional      Social History         Socioeconomic History   Marital status: Divorced      Spouse name: Not on file   Number of children: 2   Years of education: Not on file   Highest education level: Not on file  Occupational History   Occupation: Retired-Customer Financial planner  Tobacco Use   Smoking status: Never   Smokeless tobacco: Never  Substance and Sexual Activity   Alcohol use: Yes  Alcohol/week: 1.0 - 2.0 standard drink of alcohol      Types: 1 - 2 Glasses of wine per week      Comment: occasional   Drug use: No   Sexual activity: Not on file  Other Topics Concern   Not on file  Social History Narrative   Not on file    Social Determinants of Health        Financial Resource Strain: Not on file  Food Insecurity: Not on file  Transportation Needs: Not on file  Physical Activity: Not on file  Stress: Not on file (09/01/2019)  Social Connections: Not on file  Intimate Partner Violence: Low Risk  (05/10/2020)    Received from [facility]    Intimate Partner Violence     Insults You: Not on file     Threatens You: Not on file     Screams at You: Not on file     Physically Hurt: Not on file     Intimate Partner Violence Score: Not on file      Allergies  No Known Allergies           Current Outpatient Medications  Medication Sig Dispense Refill   acetaminophen (TYLENOL) 650 MG CR tablet Take 650 mg by mouth every 8 (eight) hours as needed for pain.       Calcium Carbonate (CALCI-CHEW PO) Take 1 tablet by mouth in the morning.       calcium carbonate (TUMS EX) 750 MG chewable tablet Chew 2  tablets by mouth daily as needed for heartburn.       cholecalciferol (VITAMIN D3) 25 MCG (1000 UNIT) tablet Take 1,000 Units by mouth daily.       escitalopram (LEXAPRO) 10 MG tablet Take 10 mg by mouth daily.       losartan (COZAAR) 25 MG tablet TAKE 1 TABLET(25 MG) BY MOUTH DAILY 90 tablet 3   Polyethyl Glycol-Propyl Glycol (LUBRICATING EYE DROPS OP) Place 1 drop into both eyes daily as needed (dry eyes).       rosuvastatin (CRESTOR) 10 MG tablet Take 10 mg by mouth daily.          No current facility-administered medications for this visit.               Family History  Problem Relation Age of Onset   Heart disease Mother     Diabetes Mother     Kidney failure Mother     Skin cancer Father          on ear; unknown type   Lung cancer Father 13        smoker; metastasis to bone and liver   Diabetes Brother     Heart disease Brother     Kidney failure Brother     Diabetes Maternal Aunt     Heart disease Maternal Aunt     Heart Problems Maternal Uncle     Diabetes Maternal Uncle     Skin cancer Paternal Uncle          on nose; unknown type   Heart attack Maternal Grandfather     Heart attack Paternal Grandmother     Heart attack Paternal Grandfather     Bladder Cancer Brother          dx. late 13s; smoker   Alzheimer's disease Paternal Aunt     Heart disease Paternal Aunt     Alzheimer's disease Paternal Uncle     Breast  cancer Neg Hx                  Physical Exam: Deffered         Diagnostic Studies & Laboratory data: I have personally reviewed the following studies and agree with the findings     Recent Radiology Findings:        Recent Lab Findings: Recent Labs       Lab Results  Component Value Date    WBC 5.5 01/14/2023    HGB 12.7 01/14/2023    HCT 38.8 01/14/2023    PLT 360 01/14/2023    GLUCOSE 119 (H) 01/14/2023    ALT 48 02/01/2015    AST 28 02/01/2015    NA 138 01/14/2023    K 4.8 01/14/2023    CL 106 01/14/2023    CREATININE 1.04  (H) 01/14/2023    BUN 18 01/14/2023    CO2 25 01/14/2023            Assessment / Plan:     67 yo female with NYHA class 2 symptoms of severe paradoxical low flow low gradient bicuspid AS who had low coronary ostia to be a good candidate for TAVR. She would best be served now with tissue SAVR and will need repeat cath to rule out CAD (its been over 2 years since last cath). She also has recent PPM and we discussed all the risks and goals of surgery and the recovery and she understands and wishes to proceed. Will proceed on October 16th.

## 2023-05-06 ENCOUNTER — Inpatient Hospital Stay (HOSPITAL_COMMUNITY): Payer: Medicare Other

## 2023-05-06 ENCOUNTER — Encounter (HOSPITAL_COMMUNITY): Payer: Self-pay | Admitting: Thoracic Surgery (Cardiothoracic Vascular Surgery)

## 2023-05-06 ENCOUNTER — Inpatient Hospital Stay (HOSPITAL_COMMUNITY)
Admission: RE | Admit: 2023-05-06 | Discharge: 2023-05-11 | DRG: 220 | Disposition: A | Discharge status: Home / Self Care | Payer: Medicare Other | Attending: Thoracic Surgery (Cardiothoracic Vascular Surgery) | Admitting: Thoracic Surgery (Cardiothoracic Vascular Surgery)

## 2023-05-06 ENCOUNTER — Inpatient Hospital Stay (HOSPITAL_COMMUNITY): Payer: Self-pay | Admitting: Physician Assistant

## 2023-05-06 ENCOUNTER — Other Ambulatory Visit: Payer: Self-pay

## 2023-05-06 ENCOUNTER — Inpatient Hospital Stay (HOSPITAL_COMMUNITY): Payer: Medicare Other | Admitting: Anesthesiology

## 2023-05-06 ENCOUNTER — Inpatient Hospital Stay (HOSPITAL_COMMUNITY)
Admission: RE | Disposition: A | Discharge status: Home / Self Care | Payer: Self-pay | Source: Home / Self Care | Attending: Thoracic Surgery (Cardiothoracic Vascular Surgery)

## 2023-05-06 DIAGNOSIS — Z952 Presence of prosthetic heart valve: Secondary | ICD-10-CM | POA: Diagnosis not present

## 2023-05-06 DIAGNOSIS — I509 Heart failure, unspecified: Secondary | ICD-10-CM | POA: Diagnosis present

## 2023-05-06 DIAGNOSIS — Z79899 Other long term (current) drug therapy: Secondary | ICD-10-CM | POA: Diagnosis not present

## 2023-05-06 DIAGNOSIS — E871 Hypo-osmolality and hyponatremia: Secondary | ICD-10-CM | POA: Diagnosis not present

## 2023-05-06 DIAGNOSIS — Z170 Estrogen receptor positive status [ER+]: Secondary | ICD-10-CM | POA: Diagnosis not present

## 2023-05-06 DIAGNOSIS — Z841 Family history of disorders of kidney and ureter: Secondary | ICD-10-CM | POA: Diagnosis not present

## 2023-05-06 DIAGNOSIS — Z1721 Progesterone receptor positive status: Secondary | ICD-10-CM | POA: Diagnosis not present

## 2023-05-06 DIAGNOSIS — E785 Hyperlipidemia, unspecified: Secondary | ICD-10-CM | POA: Diagnosis present

## 2023-05-06 DIAGNOSIS — Z1731 Human epidermal growth factor receptor 2 positive status: Secondary | ICD-10-CM

## 2023-05-06 DIAGNOSIS — Z853 Personal history of malignant neoplasm of breast: Secondary | ICD-10-CM

## 2023-05-06 DIAGNOSIS — Z4682 Encounter for fitting and adjustment of non-vascular catheter: Secondary | ICD-10-CM | POA: Diagnosis not present

## 2023-05-06 DIAGNOSIS — J9811 Atelectasis: Secondary | ICD-10-CM | POA: Diagnosis not present

## 2023-05-06 DIAGNOSIS — I350 Nonrheumatic aortic (valve) stenosis: Secondary | ICD-10-CM

## 2023-05-06 DIAGNOSIS — Z9911 Dependence on respirator [ventilator] status: Secondary | ICD-10-CM | POA: Diagnosis not present

## 2023-05-06 DIAGNOSIS — I4430 Unspecified atrioventricular block: Secondary | ICD-10-CM | POA: Diagnosis present

## 2023-05-06 DIAGNOSIS — M1990 Unspecified osteoarthritis, unspecified site: Secondary | ICD-10-CM | POA: Diagnosis present

## 2023-05-06 DIAGNOSIS — I2510 Atherosclerotic heart disease of native coronary artery without angina pectoris: Secondary | ICD-10-CM | POA: Diagnosis present

## 2023-05-06 DIAGNOSIS — Z950 Presence of cardiac pacemaker: Secondary | ICD-10-CM

## 2023-05-06 DIAGNOSIS — I272 Pulmonary hypertension, unspecified: Secondary | ICD-10-CM | POA: Diagnosis present

## 2023-05-06 DIAGNOSIS — Z9221 Personal history of antineoplastic chemotherapy: Secondary | ICD-10-CM | POA: Diagnosis not present

## 2023-05-06 DIAGNOSIS — Z951 Presence of aortocoronary bypass graft: Secondary | ICD-10-CM | POA: Diagnosis not present

## 2023-05-06 DIAGNOSIS — Z801 Family history of malignant neoplasm of trachea, bronchus and lung: Secondary | ICD-10-CM

## 2023-05-06 DIAGNOSIS — Z452 Encounter for adjustment and management of vascular access device: Secondary | ICD-10-CM | POA: Diagnosis not present

## 2023-05-06 DIAGNOSIS — Z8249 Family history of ischemic heart disease and other diseases of the circulatory system: Secondary | ICD-10-CM

## 2023-05-06 DIAGNOSIS — Z8052 Family history of malignant neoplasm of bladder: Secondary | ICD-10-CM | POA: Diagnosis not present

## 2023-05-06 DIAGNOSIS — Z923 Personal history of irradiation: Secondary | ICD-10-CM | POA: Diagnosis not present

## 2023-05-06 DIAGNOSIS — R739 Hyperglycemia, unspecified: Secondary | ICD-10-CM | POA: Diagnosis present

## 2023-05-06 DIAGNOSIS — Z1152 Encounter for screening for COVID-19: Secondary | ICD-10-CM

## 2023-05-06 DIAGNOSIS — I110 Hypertensive heart disease with heart failure: Secondary | ICD-10-CM | POA: Diagnosis present

## 2023-05-06 DIAGNOSIS — Z808 Family history of malignant neoplasm of other organs or systems: Secondary | ICD-10-CM

## 2023-05-06 DIAGNOSIS — Z820 Family history of epilepsy and other diseases of the nervous system: Secondary | ICD-10-CM

## 2023-05-06 DIAGNOSIS — R0989 Other specified symptoms and signs involving the circulatory and respiratory systems: Secondary | ICD-10-CM | POA: Diagnosis not present

## 2023-05-06 DIAGNOSIS — Z833 Family history of diabetes mellitus: Secondary | ICD-10-CM

## 2023-05-06 DIAGNOSIS — Z87442 Personal history of urinary calculi: Secondary | ICD-10-CM

## 2023-05-06 DIAGNOSIS — I060 Rheumatic aortic stenosis: Secondary | ICD-10-CM | POA: Diagnosis present

## 2023-05-06 DIAGNOSIS — D62 Acute posthemorrhagic anemia: Secondary | ICD-10-CM | POA: Diagnosis not present

## 2023-05-06 HISTORY — PX: AORTIC VALVE REPLACEMENT: SHX41

## 2023-05-06 HISTORY — PX: TEE WITHOUT CARDIOVERSION: SHX5443

## 2023-05-06 HISTORY — PX: CORONARY ARTERY BYPASS GRAFT: SHX141

## 2023-05-06 LAB — CBC
HCT: 25 % — ABNORMAL LOW (ref 36.0–46.0)
HCT: 24.8 % — ABNORMAL LOW (ref 36.0–46.0)
Hemoglobin: 8.1 g/dL — ABNORMAL LOW (ref 12.0–15.0)
Hemoglobin: 8.2 g/dL — ABNORMAL LOW (ref 12.0–15.0)
MCH: 27.9 pg (ref 26.0–34.0)
MCH: 28.9 pg (ref 26.0–34.0)
MCHC: 32.4 g/dL (ref 30.0–36.0)
MCHC: 33.1 g/dL (ref 30.0–36.0)
MCV: 86.2 fL (ref 80.0–100.0)
MCV: 87.3 fL (ref 80.0–100.0)
Platelets: 139 10*3/uL — ABNORMAL LOW (ref 150–400)
Platelets: 136 10*3/uL — ABNORMAL LOW (ref 150–400)
RBC: 2.9 MIL/uL — ABNORMAL LOW (ref 3.87–5.11)
RBC: 2.84 MIL/uL — ABNORMAL LOW (ref 3.87–5.11)
RDW: 13.5 % (ref 11.5–15.5)
RDW: 13.8 % (ref 11.5–15.5)
WBC: 11 10*3/uL — ABNORMAL HIGH (ref 4.0–10.5)
WBC: 8.3 10*3/uL (ref 4.0–10.5)
nRBC: 0 % (ref 0.0–0.2)
nRBC: 0 % (ref 0.0–0.2)

## 2023-05-06 LAB — POCT I-STAT 7, (LYTES, BLD GAS, ICA,H+H)
Acid-Base Excess: 0 mmol/L (ref 0.0–2.0)
Acid-base deficit: 4 mmol/L — ABNORMAL HIGH (ref 0.0–2.0)
Acid-base deficit: 5 mmol/L — ABNORMAL HIGH (ref 0.0–2.0)
Acid-base deficit: 5 mmol/L — ABNORMAL HIGH (ref 0.0–2.0)
Acid-base deficit: 7 mmol/L — ABNORMAL HIGH (ref 0.0–2.0)
Bicarbonate: 20.4 mmol/L (ref 20.0–28.0)
Bicarbonate: 20.4 mmol/L (ref 20.0–28.0)
Bicarbonate: 21.8 mmol/L (ref 20.0–28.0)
Bicarbonate: 19.7 mmol/L — ABNORMAL LOW (ref 20.0–28.0)
Bicarbonate: 25.5 mmol/L (ref 20.0–28.0)
Calcium, Ion: 0.91 mmol/L — ABNORMAL LOW (ref 1.15–1.40)
Calcium, Ion: 1.34 mmol/L (ref 1.15–1.40)
Calcium, Ion: 1.21 mmol/L (ref 1.15–1.40)
Calcium, Ion: 1.19 mmol/L (ref 1.15–1.40)
Calcium, Ion: 1.12 mmol/L — ABNORMAL LOW (ref 1.15–1.40)
HCT: 23 % — ABNORMAL LOW (ref 36.0–46.0)
HCT: 23 % — ABNORMAL LOW (ref 36.0–46.0)
HCT: 25 % — ABNORMAL LOW (ref 36.0–46.0)
HCT: 25 % — ABNORMAL LOW (ref 36.0–46.0)
HCT: 23 % — ABNORMAL LOW (ref 36.0–46.0)
Hemoglobin: 7.8 g/dL — ABNORMAL LOW (ref 12.0–15.0)
Hemoglobin: 7.8 g/dL — ABNORMAL LOW (ref 12.0–15.0)
Hemoglobin: 8.5 g/dL — ABNORMAL LOW (ref 12.0–15.0)
Hemoglobin: 8.5 g/dL — ABNORMAL LOW (ref 12.0–15.0)
Hemoglobin: 7.8 g/dL — ABNORMAL LOW (ref 12.0–15.0)
O2 Saturation: 100 %
O2 Saturation: 92 %
O2 Saturation: 98 %
O2 Saturation: 99 %
O2 Saturation: 98 %
Patient temperature: 36.1
Patient temperature: 36.6
Patient temperature: 37.3
Potassium: 4.8 mmol/L (ref 3.5–5.1)
Potassium: 4.9 mmol/L (ref 3.5–5.1)
Potassium: 4 mmol/L (ref 3.5–5.1)
Potassium: 4.7 mmol/L (ref 3.5–5.1)
Potassium: 4.3 mmol/L (ref 3.5–5.1)
Sodium: 135 mmol/L (ref 135–145)
Sodium: 136 mmol/L (ref 135–145)
Sodium: 139 mmol/L (ref 135–145)
Sodium: 138 mmol/L (ref 135–145)
Sodium: 140 mmol/L (ref 135–145)
TCO2: 21 mmol/L — ABNORMAL LOW (ref 22–32)
TCO2: 22 mmol/L (ref 22–32)
TCO2: 23 mmol/L (ref 22–32)
TCO2: 21 mmol/L — ABNORMAL LOW (ref 22–32)
TCO2: 27 mmol/L (ref 22–32)
pCO2 arterial: 34.8 mm[Hg] (ref 32–48)
pCO2 arterial: 38.1 mm[Hg] (ref 32–48)
pCO2 arterial: 46.4 mm[Hg] (ref 32–48)
pCO2 arterial: 41.3 mm[Hg] (ref 32–48)
pCO2 arterial: 44.7 mm[Hg] (ref 32–48)
pH, Arterial: 7.375 (ref 7.35–7.45)
pH, Arterial: 7.337 — ABNORMAL LOW (ref 7.35–7.45)
pH, Arterial: 7.275 — ABNORMAL LOW (ref 7.35–7.45)
pH, Arterial: 7.285 — ABNORMAL LOW (ref 7.35–7.45)
pH, Arterial: 7.365 (ref 7.35–7.45)
pO2, Arterial: 353 mm[Hg] — ABNORMAL HIGH (ref 83–108)
pO2, Arterial: 68 mm[Hg] — ABNORMAL LOW (ref 83–108)
pO2, Arterial: 107 mm[Hg] (ref 83–108)
pO2, Arterial: 156 mm[Hg] — ABNORMAL HIGH (ref 83–108)
pO2, Arterial: 120 mm[Hg] — ABNORMAL HIGH (ref 83–108)

## 2023-05-06 LAB — POCT I-STAT, CHEM 8
BUN: 13 mg/dL (ref 8–23)
BUN: 12 mg/dL (ref 8–23)
BUN: 11 mg/dL (ref 8–23)
Calcium, Ion: 1.24 mmol/L (ref 1.15–1.40)
Calcium, Ion: 1.05 mmol/L — ABNORMAL LOW (ref 1.15–1.40)
Calcium, Ion: 1.36 mmol/L (ref 1.15–1.40)
Chloride: 104 mmol/L (ref 98–111)
Chloride: 99 mmol/L (ref 98–111)
Chloride: 103 mmol/L (ref 98–111)
Creatinine, Ser: 0.8 mg/dL (ref 0.44–1.00)
Creatinine, Ser: 0.7 mg/dL (ref 0.44–1.00)
Creatinine, Ser: 0.8 mg/dL (ref 0.44–1.00)
Glucose, Bld: 132 mg/dL — ABNORMAL HIGH (ref 70–99)
Glucose, Bld: 146 mg/dL — ABNORMAL HIGH (ref 70–99)
Glucose, Bld: 173 mg/dL — ABNORMAL HIGH (ref 70–99)
HCT: 30 % — ABNORMAL LOW (ref 36.0–46.0)
HCT: 23 % — ABNORMAL LOW (ref 36.0–46.0)
HCT: 22 % — ABNORMAL LOW (ref 36.0–46.0)
Hemoglobin: 10.2 g/dL — ABNORMAL LOW (ref 12.0–15.0)
Hemoglobin: 7.8 g/dL — ABNORMAL LOW (ref 12.0–15.0)
Hemoglobin: 7.5 g/dL — ABNORMAL LOW (ref 12.0–15.0)
Potassium: 4.3 mmol/L (ref 3.5–5.1)
Potassium: 5.1 mmol/L (ref 3.5–5.1)
Potassium: 4.8 mmol/L (ref 3.5–5.1)
Sodium: 138 mmol/L (ref 135–145)
Sodium: 134 mmol/L — ABNORMAL LOW (ref 135–145)
Sodium: 136 mmol/L (ref 135–145)
TCO2: 22 mmol/L (ref 22–32)
TCO2: 22 mmol/L (ref 22–32)
TCO2: 20 mmol/L — ABNORMAL LOW (ref 22–32)

## 2023-05-06 LAB — GLUCOSE, CAPILLARY
Glucose-Capillary: 115 mg/dL — ABNORMAL HIGH (ref 70–99)
Glucose-Capillary: 121 mg/dL — ABNORMAL HIGH (ref 70–99)
Glucose-Capillary: 134 mg/dL — ABNORMAL HIGH (ref 70–99)
Glucose-Capillary: 136 mg/dL — ABNORMAL HIGH (ref 70–99)
Glucose-Capillary: 144 mg/dL — ABNORMAL HIGH (ref 70–99)
Glucose-Capillary: 144 mg/dL — ABNORMAL HIGH (ref 70–99)
Glucose-Capillary: 144 mg/dL — ABNORMAL HIGH (ref 70–99)
Glucose-Capillary: 152 mg/dL — ABNORMAL HIGH (ref 70–99)
Glucose-Capillary: 159 mg/dL — ABNORMAL HIGH (ref 70–99)
Glucose-Capillary: 68 mg/dL — ABNORMAL LOW (ref 70–99)

## 2023-05-06 LAB — APTT: aPTT: 38 s — ABNORMAL HIGH (ref 24–36)

## 2023-05-06 LAB — HEMOGLOBIN AND HEMATOCRIT, BLOOD
HCT: 22.9 % — ABNORMAL LOW (ref 36.0–46.0)
Hemoglobin: 7.4 g/dL — ABNORMAL LOW (ref 12.0–15.0)

## 2023-05-06 LAB — MAGNESIUM: Magnesium: 3.2 mg/dL — ABNORMAL HIGH (ref 1.7–2.4)

## 2023-05-06 LAB — PROTIME-INR
INR: 1.6 — ABNORMAL HIGH (ref 0.8–1.2)
Prothrombin Time: 19.7 s — ABNORMAL HIGH (ref 11.4–15.2)

## 2023-05-06 LAB — ECHO INTRAOPERATIVE TEE
Height: 63.5 in
Weight: 2574.41 [oz_av]

## 2023-05-06 LAB — BASIC METABOLIC PANEL
Anion gap: 8 (ref 5–15)
BUN: 10 mg/dL (ref 8–23)
CO2: 24 mmol/L (ref 22–32)
Calcium: 7.7 mg/dL — ABNORMAL LOW (ref 8.9–10.3)
Chloride: 104 mmol/L (ref 98–111)
Creatinine, Ser: 0.99 mg/dL (ref 0.44–1.00)
GFR, Estimated: >60 mL/min (ref >60)
Glucose, Bld: 148 mg/dL — ABNORMAL HIGH (ref 70–99)
Potassium: 4.2 mmol/L (ref 3.5–5.1)
Sodium: 136 mmol/L (ref 135–145)

## 2023-05-06 LAB — POCT I-STAT EG7
Acid-base deficit: 3 mmol/L — ABNORMAL HIGH (ref 0.0–2.0)
Bicarbonate: 21.9 mmol/L (ref 20.0–28.0)
Calcium, Ion: 0.99 mmol/L — ABNORMAL LOW (ref 1.15–1.40)
HCT: 22 % — ABNORMAL LOW (ref 36.0–46.0)
Hemoglobin: 7.5 g/dL — ABNORMAL LOW (ref 12.0–15.0)
O2 Saturation: 74 %
Potassium: 4.3 mmol/L (ref 3.5–5.1)
Sodium: 136 mmol/L (ref 135–145)
TCO2: 23 mmol/L (ref 22–32)
pCO2, Ven: 37.8 mm[Hg] — ABNORMAL LOW (ref 44–60)
pH, Ven: 7.371 (ref 7.25–7.43)
pO2, Ven: 40 mm[Hg] (ref 32–45)

## 2023-05-06 LAB — PLATELET COUNT: Platelets: 190 10*3/uL (ref 150–400)

## 2023-05-06 LAB — ABO/RH: ABO/RH(D): O POS

## 2023-05-06 SURGERY — CORONARY ARTERY BYPASS GRAFTING (CABG)
Anesthesia: General | Site: Chest

## 2023-05-06 MED ORDER — PHENYLEPHRINE HCL-NACL 20-0.9 MG/250ML-% IV SOLN: 30.0000 ug/min | INTRAVENOUS | Status: DC

## 2023-05-06 MED ORDER — SODIUM CHLORIDE 0.9% FLUSH
3.0000 mL | Freq: Two times a day (BID) | INTRAVENOUS | Status: DC
  Administered 2023-05-07 – 2023-05-11 (×9): 3 mL via INTRAVENOUS

## 2023-05-06 MED ORDER — SODIUM CHLORIDE 0.9 % IV SOLN: INTRAVENOUS | Status: DC | PRN

## 2023-05-06 MED ORDER — FENTANYL CITRATE (PF) 250 MCG/5ML IJ SOLN
INTRAMUSCULAR | Status: DC | PRN
  Administered 2023-05-06: 100 ug via INTRAVENOUS
  Administered 2023-05-06: 50 ug via INTRAVENOUS
  Administered 2023-05-06: 100 ug via INTRAVENOUS
  Administered 2023-05-06: 50 ug via INTRAVENOUS
  Administered 2023-05-06: 100 ug via INTRAVENOUS
  Administered 2023-05-06: 50 ug via INTRAVENOUS
  Administered 2023-05-06: 100 ug via INTRAVENOUS
  Administered 2023-05-06: 50 ug via INTRAVENOUS

## 2023-05-06 MED ORDER — PHENYLEPHRINE 80 MCG/ML (10ML) SYRINGE FOR IV PUSH (FOR BLOOD PRESSURE SUPPORT)
PREFILLED_SYRINGE | INTRAVENOUS | Status: DC | PRN
  Administered 2023-05-06 (×4): 80 ug via INTRAVENOUS
  Administered 2023-05-06: 40 ug via INTRAVENOUS
  Administered 2023-05-06: 160 ug via INTRAVENOUS
  Administered 2023-05-06: 120 ug via INTRAVENOUS
  Administered 2023-05-06 (×3): 80 ug via INTRAVENOUS

## 2023-05-06 MED ORDER — PLASMA-LYTE A IV SOLN
INTRAVENOUS | Status: DC | PRN
  Administered 2023-05-06: 500 mL

## 2023-05-06 MED ORDER — FENTANYL CITRATE (PF) 250 MCG/5ML IJ SOLN
INTRAMUSCULAR | Status: AC
  Filled 2023-05-06: qty 5

## 2023-05-06 MED ORDER — CHLORHEXIDINE GLUCONATE 0.12 % MT SOLN
15.0000 mL | OROMUCOSAL | Status: AC
  Administered 2023-05-06: 15 mL via OROMUCOSAL
  Filled 2023-05-06: qty 15

## 2023-05-06 MED ORDER — PHENYLEPHRINE 80 MCG/ML (10ML) SYRINGE FOR IV PUSH (FOR BLOOD PRESSURE SUPPORT)
PREFILLED_SYRINGE | INTRAVENOUS | Status: AC
  Filled 2023-05-06: qty 10

## 2023-05-06 MED ORDER — SODIUM CHLORIDE 0.9% FLUSH: 10.0000 mL | INTRAVENOUS | Status: DC | PRN

## 2023-05-06 MED ORDER — METOCLOPRAMIDE HCL 5 MG/ML IJ SOLN
10.0000 mg | Freq: Four times a day (QID) | INTRAMUSCULAR | Status: AC
  Administered 2023-05-06 – 2023-05-07 (×6): 10 mg via INTRAVENOUS
  Filled 2023-05-06 (×6): qty 2

## 2023-05-06 MED ORDER — POLYETHYLENE GLYCOL 3350 17 G PO PACK
17.0000 g | PACK | Freq: Every day | ORAL | Status: DC
  Administered 2023-05-06 – 2023-05-08 (×3): 17 g
  Filled 2023-05-06 (×6): qty 1

## 2023-05-06 MED ORDER — MAGNESIUM SULFATE 4 GM/100ML IV SOLN
4.0000 g | Freq: Once | INTRAVENOUS | Status: AC
  Administered 2023-05-06: 4 g via INTRAVENOUS
  Filled 2023-05-06: qty 100

## 2023-05-06 MED ORDER — PROTAMINE SULFATE 10 MG/ML IV SOLN
INTRAVENOUS | Status: DC | PRN
  Administered 2023-05-06: 20 mg via INTRAVENOUS
  Administered 2023-05-06: 240 mg via INTRAVENOUS

## 2023-05-06 MED ORDER — ASPIRIN 325 MG PO TBEC
325.0000 mg | DELAYED_RELEASE_TABLET | Freq: Every day | ORAL | Status: DC
  Administered 2023-05-07 – 2023-05-11 (×5): 325 mg via ORAL
  Filled 2023-05-06 (×5): qty 1

## 2023-05-06 MED ORDER — PANTOPRAZOLE SODIUM 40 MG IV SOLR
40.0000 mg | Freq: Every day | INTRAVENOUS | Status: AC
  Administered 2023-05-06 – 2023-05-07 (×2): 40 mg via INTRAVENOUS
  Filled 2023-05-06 (×2): qty 10

## 2023-05-06 MED ORDER — CALCIUM CARBONATE ANTACID 750 MG PO CHEW: 2.0000 | CHEWABLE_TABLET | Freq: Every day | ORAL | Status: DC | PRN

## 2023-05-06 MED ORDER — CHLORHEXIDINE GLUCONATE 4 % EX SOLN: 30.0000 mL | CUTANEOUS | Status: DC

## 2023-05-06 MED ORDER — ASPIRIN 81 MG PO CHEW
324.0000 mg | CHEWABLE_TABLET | Freq: Once | ORAL | Status: AC
  Administered 2023-05-06: 324 mg via ORAL
  Filled 2023-05-06: qty 4

## 2023-05-06 MED ORDER — ALBUMIN HUMAN 5 % IV SOLN
250.0000 mL | INTRAVENOUS | Status: DC | PRN
  Administered 2023-05-06 (×2): 12.5 g via INTRAVENOUS

## 2023-05-06 MED ORDER — VANCOMYCIN HCL 1000 MG IV SOLR: INTRAVENOUS | Status: DC | PRN

## 2023-05-06 MED ORDER — ONDANSETRON HCL 4 MG/2ML IJ SOLN
4.0000 mg | Freq: Four times a day (QID) | INTRAMUSCULAR | Status: DC | PRN
  Administered 2023-05-06: 4 mg via INTRAVENOUS
  Filled 2023-05-06: qty 2

## 2023-05-06 MED ORDER — DOCUSATE SODIUM 100 MG PO CAPS
200.0000 mg | ORAL_CAPSULE | Freq: Every day | ORAL | Status: DC
  Administered 2023-05-07 – 2023-05-10 (×3): 200 mg via ORAL
  Filled 2023-05-06 (×5): qty 2

## 2023-05-06 MED ORDER — DEXMEDETOMIDINE HCL IN NACL 400 MCG/100ML IV SOLN: 0.0000 ug/kg/h | INTRAVENOUS | Status: DC

## 2023-05-06 MED ORDER — ROCURONIUM BROMIDE 10 MG/ML (PF) SYRINGE
PREFILLED_SYRINGE | INTRAVENOUS | Status: DC | PRN
  Administered 2023-05-06: 20 mg via INTRAVENOUS
  Administered 2023-05-06: 70 mg via INTRAVENOUS
  Administered 2023-05-06: 30 mg via INTRAVENOUS

## 2023-05-06 MED ORDER — ROCURONIUM BROMIDE 10 MG/ML (PF) SYRINGE
PREFILLED_SYRINGE | INTRAVENOUS | Status: AC
  Filled 2023-05-06: qty 10

## 2023-05-06 MED ORDER — ALBUMIN HUMAN 5 % IV SOLN: INTRAVENOUS | Status: DC | PRN

## 2023-05-06 MED ORDER — SODIUM CHLORIDE 0.9% FLUSH: 3.0000 mL | INTRAVENOUS | Status: DC | PRN

## 2023-05-06 MED ORDER — CALCIUM CHLORIDE 10 % IV SOLN
INTRAVENOUS | Status: AC
  Filled 2023-05-06: qty 10

## 2023-05-06 MED ORDER — SODIUM CHLORIDE 0.45 % IV SOLN: INTRAVENOUS | Status: DC | PRN

## 2023-05-06 MED ORDER — LACTATED RINGERS IV SOLN: INTRAVENOUS | Status: AC

## 2023-05-06 MED ORDER — POLYETHYL GLYCOL-PROPYL GLYCOL 0.4-0.3 % OP SOLN: Freq: Every day | OPHTHALMIC | Status: DC | PRN

## 2023-05-06 MED ORDER — CALCIUM CARBONATE ANTACID 500 MG PO CHEW: 400.0000 mg | CHEWABLE_TABLET | Freq: Every day | ORAL | Status: DC | PRN

## 2023-05-06 MED ORDER — HEPARIN SODIUM (PORCINE) 1000 UNIT/ML IJ SOLN
INTRAMUSCULAR | Status: DC | PRN
  Administered 2023-05-06: 26000 [IU] via INTRAVENOUS

## 2023-05-06 MED ORDER — PANTOPRAZOLE SODIUM 40 MG PO TBEC
40.0000 mg | DELAYED_RELEASE_TABLET | Freq: Every day | ORAL | Status: DC
  Administered 2023-05-08 – 2023-05-11 (×4): 40 mg via ORAL
  Filled 2023-05-06 (×4): qty 1

## 2023-05-06 MED ORDER — HEPARIN SODIUM (PORCINE) 1000 UNIT/ML IJ SOLN
INTRAMUSCULAR | Status: AC
  Filled 2023-05-06: qty 1

## 2023-05-06 MED ORDER — METOPROLOL TARTRATE 25 MG/10 ML ORAL SUSPENSION
12.5000 mg | Freq: Two times a day (BID) | ORAL | Status: DC
  Filled 2023-05-06 (×4): qty 5

## 2023-05-06 MED ORDER — ASPIRIN 81 MG PO CHEW
324.0000 mg | CHEWABLE_TABLET | Freq: Every day | ORAL | Status: DC
  Filled 2023-05-06 (×2): qty 4

## 2023-05-06 MED ORDER — 0.9 % SODIUM CHLORIDE (POUR BTL) OPTIME
TOPICAL | Status: DC | PRN
  Administered 2023-05-06: 5000 mL

## 2023-05-06 MED ORDER — PROPOFOL 10 MG/ML IV BOLUS
INTRAVENOUS | Status: DC | PRN
  Administered 2023-05-06: 20 mg via INTRAVENOUS
  Administered 2023-05-06: 90 mg via INTRAVENOUS
  Administered 2023-05-06: 20 mg via INTRAVENOUS

## 2023-05-06 MED ORDER — ORAL CARE MOUTH RINSE: 15.0000 mL | OROMUCOSAL | Status: DC | PRN

## 2023-05-06 MED ORDER — ~~LOC~~ CARDIAC SURGERY, PATIENT & FAMILY EDUCATION
Freq: Once | Status: DC
  Filled 2023-05-06: qty 1

## 2023-05-06 MED ORDER — DOCUSATE SODIUM 50 MG/5ML PO LIQD: 100.0000 mg | Freq: Two times a day (BID) | ORAL | Status: DC

## 2023-05-06 MED ORDER — PAPAVERINE HCL 30 MG/ML IJ SOLN
INTRAMUSCULAR | Status: DC | PRN
  Administered 2023-05-06: 60 mg via INTRAVENOUS

## 2023-05-06 MED ORDER — DEXTROSE 50 % IV SOLN: 0.0000 mL | INTRAVENOUS | Status: DC | PRN

## 2023-05-06 MED ORDER — SODIUM BICARBONATE 8.4 % IV SOLN
100.0000 meq | Freq: Once | INTRAVENOUS | Status: AC
  Administered 2023-05-06: 100 meq via INTRAVENOUS

## 2023-05-06 MED ORDER — MIDAZOLAM HCL (PF) 5 MG/ML IJ SOLN
INTRAMUSCULAR | Status: DC | PRN
  Administered 2023-05-06: 1 mg via INTRAVENOUS
  Administered 2023-05-06: 2 mg via INTRAVENOUS
  Administered 2023-05-06: 1 mg via INTRAVENOUS

## 2023-05-06 MED ORDER — METOPROLOL TARTRATE 5 MG/5ML IV SOLN: 2.5000 mg | INTRAVENOUS | Status: DC | PRN

## 2023-05-06 MED ORDER — AMIODARONE HCL 200 MG PO TABS
400.0000 mg | ORAL_TABLET | Freq: Two times a day (BID) | ORAL | Status: AC
  Administered 2023-05-07 – 2023-05-11 (×9): 400 mg via ORAL
  Filled 2023-05-06 (×10): qty 2

## 2023-05-06 MED ORDER — METOPROLOL TARTRATE 12.5 MG HALF TABLET
12.5000 mg | ORAL_TABLET | Freq: Two times a day (BID) | ORAL | Status: DC
  Administered 2023-05-07 – 2023-05-11 (×9): 12.5 mg via ORAL
  Filled 2023-05-06 (×9): qty 1

## 2023-05-06 MED ORDER — ACETAMINOPHEN 160 MG/5ML PO SOLN: 650.0000 mg | Freq: Once | ORAL | Status: DC

## 2023-05-06 MED ORDER — VANCOMYCIN HCL IN DEXTROSE 1-5 GM/200ML-% IV SOLN
1000.0000 mg | Freq: Once | INTRAVENOUS | Status: AC
  Administered 2023-05-06: 1000 mg via INTRAVENOUS
  Filled 2023-05-06: qty 200

## 2023-05-06 MED ORDER — MIDAZOLAM HCL (PF) 10 MG/2ML IJ SOLN
INTRAMUSCULAR | Status: AC
  Filled 2023-05-06: qty 2

## 2023-05-06 MED ORDER — PAPAVERINE HCL 30 MG/ML IJ SOLN
INTRAMUSCULAR | Status: AC
  Filled 2023-05-06: qty 2

## 2023-05-06 MED ORDER — PROTAMINE SULFATE 10 MG/ML IV SOLN
INTRAVENOUS | Status: AC
  Filled 2023-05-06: qty 25

## 2023-05-06 MED ORDER — ACETAMINOPHEN 160 MG/5ML PO SOLN
650.0000 mg | Freq: Once | ORAL | Status: AC
  Administered 2023-05-06: 650 mg via ORAL
  Filled 2023-05-06: qty 20.3

## 2023-05-06 MED ORDER — BISACODYL 10 MG RE SUPP
10.0000 mg | Freq: Every day | RECTAL | Status: DC
  Filled 2023-05-06: qty 1

## 2023-05-06 MED ORDER — METOPROLOL TARTRATE 12.5 MG HALF TABLET
12.5000 mg | ORAL_TABLET | Freq: Once | ORAL | Status: AC
  Administered 2023-05-06: 12.5 mg via ORAL
  Filled 2023-05-06: qty 1

## 2023-05-06 MED ORDER — CHLORHEXIDINE GLUCONATE CLOTH 2 % EX PADS
6.0000 | MEDICATED_PAD | Freq: Every day | CUTANEOUS | Status: DC
  Administered 2023-05-06 – 2023-05-11 (×6): 6 via TOPICAL

## 2023-05-06 MED ORDER — SODIUM CHLORIDE 0.9 % IV SOLN: 250.0000 mL | INTRAVENOUS | Status: AC

## 2023-05-06 MED ORDER — POLYVINYL ALCOHOL 1.4 % OP SOLN
1.0000 [drp] | OPHTHALMIC | Status: DC | PRN
  Filled 2023-05-06: qty 15

## 2023-05-06 MED ORDER — ESCITALOPRAM OXALATE 10 MG PO TABS
10.0000 mg | ORAL_TABLET | Freq: Every day | ORAL | Status: DC
  Administered 2023-05-07 – 2023-05-11 (×5): 10 mg via ORAL
  Filled 2023-05-06 (×5): qty 1

## 2023-05-06 MED ORDER — INSULIN REGULAR(HUMAN) IN NACL 100-0.9 UT/100ML-% IV SOLN: INTRAVENOUS | Status: DC

## 2023-05-06 MED ORDER — ACETAMINOPHEN 160 MG/5ML PO SOLN: 1000.0000 mg | Freq: Four times a day (QID) | ORAL | Status: DC

## 2023-05-06 MED ORDER — ONDANSETRON HCL 4 MG/2ML IJ SOLN
INTRAMUSCULAR | Status: DC | PRN
  Administered 2023-05-06: 4 mg via INTRAVENOUS

## 2023-05-06 MED ORDER — PROPOFOL 10 MG/ML IV BOLUS
INTRAVENOUS | Status: AC
  Filled 2023-05-06: qty 20

## 2023-05-06 MED ORDER — TRAMADOL HCL 50 MG PO TABS
50.0000 mg | ORAL_TABLET | ORAL | Status: DC | PRN
  Administered 2023-05-07: 100 mg via ORAL
  Administered 2023-05-08: 50 mg via ORAL
  Filled 2023-05-06: qty 1
  Filled 2023-05-06: qty 2

## 2023-05-06 MED ORDER — CEFAZOLIN SODIUM-DEXTROSE 2-4 GM/100ML-% IV SOLN
2.0000 g | Freq: Three times a day (TID) | INTRAVENOUS | Status: AC
  Administered 2023-05-06 – 2023-05-08 (×6): 2 g via INTRAVENOUS
  Filled 2023-05-06 (×6): qty 100

## 2023-05-06 MED ORDER — MORPHINE SULFATE (PF) 2 MG/ML IV SOLN
1.0000 mg | INTRAVENOUS | Status: DC | PRN
  Administered 2023-05-06 – 2023-05-08 (×2): 2 mg via INTRAVENOUS
  Filled 2023-05-06 (×2): qty 1

## 2023-05-06 MED ORDER — SODIUM CHLORIDE 0.9% FLUSH
10.0000 mL | Freq: Two times a day (BID) | INTRAVENOUS | Status: DC
  Administered 2023-05-07 – 2023-05-10 (×4): 10 mL

## 2023-05-06 MED ORDER — ROSUVASTATIN CALCIUM 5 MG PO TABS
10.0000 mg | ORAL_TABLET | Freq: Every day | ORAL | Status: DC
  Administered 2023-05-07 – 2023-05-11 (×5): 10 mg via ORAL
  Filled 2023-05-06 (×5): qty 2

## 2023-05-06 MED ORDER — ACETAMINOPHEN 500 MG PO TABS
1000.0000 mg | ORAL_TABLET | Freq: Four times a day (QID) | ORAL | Status: DC
  Administered 2023-05-07 – 2023-05-10 (×15): 1000 mg via ORAL
  Filled 2023-05-06 (×17): qty 2

## 2023-05-06 MED ORDER — LACTATED RINGERS IV SOLN: INTRAVENOUS | Status: DC | PRN

## 2023-05-06 MED ORDER — OXYCODONE HCL 5 MG PO TABS
5.0000 mg | ORAL_TABLET | ORAL | Status: DC | PRN
  Administered 2023-05-07 (×2): 10 mg via ORAL
  Filled 2023-05-06 (×3): qty 2

## 2023-05-06 MED ORDER — DEXTROSE 50 % IV SOLN
12.5000 g | INTRAVENOUS | Status: AC
  Administered 2023-05-06: 12.5 g via INTRAVENOUS
  Filled 2023-05-06: qty 50

## 2023-05-06 MED ORDER — POTASSIUM CHLORIDE 10 MEQ/50ML IV SOLN: 10.0000 meq | INTRAVENOUS | Status: AC

## 2023-05-06 MED ORDER — CHLORHEXIDINE GLUCONATE 0.12 % MT SOLN
15.0000 mL | Freq: Once | OROMUCOSAL | Status: AC
  Administered 2023-05-06: 15 mL via OROMUCOSAL
  Filled 2023-05-06: qty 15

## 2023-05-06 MED ORDER — SODIUM CHLORIDE 0.9 % IV SOLN: INTRAVENOUS | Status: DC

## 2023-05-06 MED ORDER — BISACODYL 5 MG PO TBEC
10.0000 mg | DELAYED_RELEASE_TABLET | Freq: Every day | ORAL | Status: DC
  Administered 2023-05-07 – 2023-05-08 (×2): 10 mg via ORAL
  Filled 2023-05-06 (×5): qty 2

## 2023-05-06 MED ORDER — MIDAZOLAM HCL 2 MG/2ML IJ SOLN: 2.0000 mg | INTRAMUSCULAR | Status: DC | PRN

## 2023-05-06 SURGICAL SUPPLY — 97 items
5 PAIRS OF YELLOW SUTURE CLAMP (MISCELLANEOUS) ×2
ADAPTER CARDIO PERF ANTE/RETRO (ADAPTER) ×3 IMPLANT
ADPR PRFSN 84XANTGRD RTRGD (ADAPTER) ×2
ANTIFOG SOL W/FOAM PAD STRL (MISCELLANEOUS) ×2
BAG DECANTER FOR FLEXI CONT (MISCELLANEOUS) ×3 IMPLANT
BLADE CLIPPER SURG (BLADE) ×3 IMPLANT
BLADE MICRO SHARP 3 15 DEG (BLADE) ×3 IMPLANT
BLADE STERNUM SYSTEM 6 (BLADE) ×3 IMPLANT
BNDG ELASTIC 4X5.8 VLCR STR LF (GAUZE/BANDAGES/DRESSINGS) ×3 IMPLANT
BNDG ELASTIC 6X5.8 VLCR STR LF (GAUZE/BANDAGES/DRESSINGS) ×3 IMPLANT
BNDG GAUZE DERMACEA FLUFF 4 (GAUZE/BANDAGES/DRESSINGS) ×3 IMPLANT
BNDG GZE DERMACEA 4 6PLY (GAUZE/BANDAGES/DRESSINGS) ×2
BOOT SUTURE VASCULAR YLW (MISCELLANEOUS) ×2
CANISTER SUCT 3000ML PPV (MISCELLANEOUS) ×3 IMPLANT
CANNULA LEFT HEART VENT 20FR (CATHETERS) IMPLANT
CANNULA MC2 2 STG 36/46 NON-V (CANNULA) IMPLANT
CANNULA NON VENT 20FR 12 (CANNULA) ×3 IMPLANT
CATH HEART VENT LEFT (CATHETERS) ×3 IMPLANT
CATH RETROPLEGIA CORONARY 14FR (CATHETERS) ×3 IMPLANT
CATH ROBINSON RED A/P 18FR (CATHETERS) ×9 IMPLANT
CATH THOR STR 32F SOFT 20 RADI (CATHETERS) ×6 IMPLANT
CATH THORACIC 28FR RT ANG (CATHETERS) ×3 IMPLANT
CLAMP SUTURE YELLOW 5 PAIRS (MISCELLANEOUS) ×3 IMPLANT
CLIP TI LARGE 6 (CLIP) ×3 IMPLANT
CLIP TI MEDIUM 24 (CLIP) IMPLANT
CLIP TI WIDE RED SMALL 24 (CLIP) IMPLANT
CNTNR URN SCR LID CUP LEK RST (MISCELLANEOUS) ×6 IMPLANT
CONN Y 3/8X3/8X3/8 BEN (MISCELLANEOUS) IMPLANT
CONT SPEC 4OZ STRL OR WHT (MISCELLANEOUS) ×4
DEVICE SUT CK QUICK LOAD MINI (Prosthesis & Implant Heart) IMPLANT
DRAPE CV SPLIT W-CLR ANES SCRN (DRAPES) ×3 IMPLANT
DRAPE INCISE IOBAN 66X45 STRL (DRAPES) ×3 IMPLANT
DRAPE PERI GROIN 82X75IN TIB (DRAPES) ×3 IMPLANT
DRSG AQUACEL AG ADV 3.5X10 (GAUZE/BANDAGES/DRESSINGS) ×3 IMPLANT
ELECT BLADE 4.0 EZ CLEAN MEGAD (MISCELLANEOUS) ×2
ELECT CAUTERY BLADE 6.4 (BLADE) ×3 IMPLANT
ELECT REM PT RETURN 9FT ADLT (ELECTROSURGICAL) ×4
ELECTRODE BLDE 4.0 EZ CLN MEGD (MISCELLANEOUS) ×3 IMPLANT
ELECTRODE REM PT RTRN 9FT ADLT (ELECTROSURGICAL) ×6 IMPLANT
FELT TEFLON 1X6 (MISCELLANEOUS) ×6 IMPLANT
GAUZE SPONGE 4X4 12PLY STRL (GAUZE/BANDAGES/DRESSINGS) ×6 IMPLANT
GLOVE BIOGEL PI IND STRL 6 (GLOVE) IMPLANT
GLOVE SS BIOGEL STRL SZ 6 (GLOVE) IMPLANT
GLOVE SURG SS PI 6.0 STRL IVOR (GLOVE) IMPLANT
GLOVE SURG SS PI 6.5 STRL IVOR (GLOVE) IMPLANT
GOWN STRL REUS W/ TWL LRG LVL3 (GOWN DISPOSABLE) ×18 IMPLANT
GOWN STRL REUS W/TWL LRG LVL3 (GOWN DISPOSABLE) ×16
HEMOSTAT SURGICEL 2X14 (HEMOSTASIS) IMPLANT
INSERT FOGARTY 61MM (MISCELLANEOUS) IMPLANT
INSERT FOGARTY XLG (MISCELLANEOUS) ×3 IMPLANT
KIT BASIN OR (CUSTOM PROCEDURE TRAY) ×3 IMPLANT
KIT SUCTION CATH 14FR (SUCTIONS) ×3 IMPLANT
KIT SUT CK MINI COMBO 4X17 (Prosthesis & Implant Heart) IMPLANT
KIT TURNOVER KIT B (KITS) ×3 IMPLANT
KIT VASOVIEW HEMOPRO 2 VH 4000 (KITS) ×3 IMPLANT
LINE VENT (MISCELLANEOUS) IMPLANT
MARKER DISTAL GRAFT W/ HOLDER (MISCELLANEOUS) ×6 IMPLANT
NS IRRIG 1000ML POUR BTL (IV SOLUTION) ×15 IMPLANT
ORGANIZER SUTURE GABBAY-FRATER (MISCELLANEOUS) ×3 IMPLANT
PACK E OPEN HEART (SUTURE) ×3 IMPLANT
PACK OPEN HEART (CUSTOM PROCEDURE TRAY) ×3 IMPLANT
PAD ARMBOARD 7.5X6 YLW CONV (MISCELLANEOUS) ×6 IMPLANT
PAD ELECT DEFIB RADIOL ZOLL (MISCELLANEOUS) ×3 IMPLANT
PENCIL BUTTON HOLSTER BLD 10FT (ELECTRODE) ×3 IMPLANT
POSITIONER HEAD DONUT 9IN (MISCELLANEOUS) ×3 IMPLANT
PUNCH AORTIC ROTATE 4.0MM (MISCELLANEOUS) ×3 IMPLANT
PUNCH AORTIC ROTATE 4.5MM 8IN (MISCELLANEOUS) ×3 IMPLANT
SET MPS 3-ND DEL (MISCELLANEOUS) IMPLANT
SOLUTION ANTFG W/FOAM PAD STRL (MISCELLANEOUS) IMPLANT
SUPPORT HEART JANKE-BARRON (MISCELLANEOUS) ×3 IMPLANT
SUT BONE WAX W31G (SUTURE) ×3 IMPLANT
SUT EB EXC GRN/WHT 2-0 V-5 (SUTURE) ×6 IMPLANT
SUT MNCRL AB 4-0 PS2 18 (SUTURE) ×6 IMPLANT
SUT PROLENE 4 0 RB 1 (SUTURE) ×8
SUT PROLENE 4 0 SH DA (SUTURE) ×3 IMPLANT
SUT PROLENE 4-0 RB1 .5 CRCL 36 (SUTURE) ×3 IMPLANT
SUT PROLENE 7 0 BV1 MDA (SUTURE) ×3 IMPLANT
SUT STEEL 6MS V (SUTURE) IMPLANT
SUT STEEL SZ 6 DBL 3X14 BALL (SUTURE) ×6 IMPLANT
SUT TEM PAC WIRE 2 0 SH (SUTURE) IMPLANT
SUT VIC AB 0 CTX 36 (SUTURE) ×4
SUT VIC AB 0 CTX36XBRD ANTBCTR (SUTURE) ×6 IMPLANT
SUT VIC AB 2-0 CT1 27 (SUTURE) ×4
SUT VIC AB 2-0 CT1 TAPERPNT 27 (SUTURE) ×6 IMPLANT
SYSTEM SAHARA CHEST DRAIN ATS (WOUND CARE) ×3 IMPLANT
TAG SUTURE CLAMP YLW 5PR (MISCELLANEOUS) ×2
TAPE CLOTH SURG 4X10 WHT LF (GAUZE/BANDAGES/DRESSINGS) IMPLANT
TAPE PAPER 3X10 WHT MICROPORE (GAUZE/BANDAGES/DRESSINGS) IMPLANT
TOWEL GREEN STERILE (TOWEL DISPOSABLE) ×3 IMPLANT
TOWEL GREEN STERILE FF (TOWEL DISPOSABLE) ×3 IMPLANT
TRAY FOLEY SLVR 16FR TEMP STAT (SET/KITS/TRAYS/PACK) ×3 IMPLANT
TUBE CONNECTING 12X1/4 (SUCTIONS) IMPLANT
TUBING LAP HI FLOW INSUFFLATIO (TUBING) ×3 IMPLANT
UNDERPAD 30X36 HEAVY ABSORB (UNDERPADS AND DIAPERS) ×3 IMPLANT
VALVE AORTIC SZ21 INSP/RESIL (Valve) IMPLANT
VENT LEFT HEART 12002 (CATHETERS) ×2
WATER STERILE IRR 1000ML POUR (IV SOLUTION) ×6 IMPLANT

## 2023-05-06 NOTE — Anesthesia Procedure Notes (Addendum)
Procedure Name: Intubation Date/Time: 05/06/2023 8:55 AM  Performed by: Randon Goldsmith, CRNAPre-anesthesia Checklist: Patient identified, Emergency Drugs available, Suction available and Patient being monitored Patient Re-evaluated:Patient Re-evaluated prior to induction Oxygen Delivery Method: Circle system utilized Preoxygenation: Pre-oxygenation with 100% oxygen Induction Type: IV induction Ventilation: Mask ventilation without difficulty Laryngoscope Size: Mac and 3 Grade View: Grade II Tube type: Oral Tube size: 8.0 mm Number of attempts: 2 Airway Equipment and Method: Stylet and Oral airway Placement Confirmation: ETT inserted through vocal cords under direct vision, positive ETCO2 and breath sounds checked- equal and bilateral Secured at: 21 cm Tube secured with: Tape Dental Injury: Teeth and Oropharynx as per pre-operative assessment

## 2023-05-06 NOTE — Consult Note (Addendum)
NAME:  [name], MRN:  [id number], DOB:  25-May-1956, LOS: 0 ADMISSION DATE:  05/06/2023, CONSULTATION DATE:  10/16 REFERRING MD:  Dr. Leafy Ro, CHIEF COMPLAINT:  CABG x1; aortic valve stenosis s/p AVR  History of Present Illness:  Patient is a 67 yo F w/ pertinent PMH aortic valve stenosis w/ bicuspid valve, high grade degree AV block w/ PPM, CAD, HTN, HLD presents to [facility] on 10/16 for elective AVR.  Patient been followed by cardiology. Echo showing AS. Had heart block a few months ago requiring PPM. Having worsening fatigue and DOE. TCTS following and plan for AVR.  10/16 admitted for elective AVR. Post op intubated/sedated and transferred to [facility] ICU. PCCM consulted.  Pertinent  Medical History   Past Medical History:  Diagnosis Date   Anxiety    Aortic valve stenosis    Arthritis    Breast cancer (HCC) 2004   right breast; ER/PR+, Her2+   Cancer (HCC) 10/2002   right breast lumpectomy   Coronary artery disease    Dysrhythmia    2nd Degree AV block   GERD (gastroesophageal reflux disease)    Heart murmur    History of kidney stones    HX   HTN (hypertension)    Hyperlipidemia    Morton neuroma    Personal history of chemotherapy 11/19/2002   Personal history of radiation therapy 02/19/2003   Pneumonia    Pre-diabetes    Presence of permanent cardiac pacemaker 01/21/2023   Medtronic     Significant Hospital Events: Including procedures, antibiotic start and stop dates in addition to other pertinent events   10/16 AVR and CABGx1  Interim History / Subjective:  Intubated on mech vent Sedate w/ precedex Off neo  Objective   Blood pressure (!) 144/87, pulse 82, temperature 98.2 F (36.8 C), temperature source Oral, resp. rate 18, height 5' 3.5" (1.613 m), weight 73 kg, SpO2 98%.        Intake/Output Summary (Last 24 hours) at 05/06/2023 1145 Last data filed at 05/06/2023 1130 Gross per 24 hour  Intake 1400 ml  Output 765 ml  Net 635 ml   Filed  Weights   05/06/23 0635  Weight: 73 kg    Examination: General: critically ill appearing on mech vent HEENT: MM pink/moist; ETT in place Neuro: sedate CV: s1s2, paced rate 80; CT in place PULM:  dim clear BS bilaterally; on mech vent simv GI: soft, bsx4 active  Extremities: warm/dry, no edema  Skin: no rashes or lesions   Resolved Hospital Problem list     Assessment & Plan:  Postoperative vent management Plan: - CXR to confirm ETT and CT position - Continue full vent support (4-8cc/kg IBW) - Wean FiO2 for O2 sat > 90% - Daily WUA/SBT, rapid wean with SIMV per protocol - VAP bundle - Pulmonary hygiene - F/u ABG - PAD protocol for sedation: Precedex for goal RASS 0 to -1  S/p CABG x1 and AVR for aortic stenosis High grade AV block w/ PPM CAD HTN HLD Plan: - neo off;  map goal >65 - albumin per protocol - Continue ASA and statin - wean insulin gtt per protocol - Postoperative care per TCTS - CT management per protocol - f/u post op labs  Best Practice (right click and "Reselect all SmartList Selections" daily)   Diet/type: NPO w/ meds via tube DVT prophylaxis: SCD GI prophylaxis: PPI Lines: Central line and Arterial Line Foley:  Yes, and it is still needed Code Status:  full  code Last date of multidisciplinary goals of care discussion [per primary]  Labs   CBC: Recent Labs  Lab 05/04/23 1057 05/06/23 1003 05/06/23 1023 05/06/23 1028 05/06/23 1052 05/06/23 1116  WBC 6.3  --   --   --   --   --   HGB 13.3 10.2* 7.8* 7.5* 7.8* 7.4*  HCT 40.7 30.0* 23.0* 22.0* 23.0* 22.9*  MCV 86.6  --   --   --   --   --   PLT 333  --   --   --   --  190    Basic Metabolic Panel: Recent Labs  Lab 05/04/23 1057 05/06/23 1003 05/06/23 1023 05/06/23 1028 05/06/23 1052  NA 137 138 135 136 134*  K 4.5 4.3 4.8 4.3 5.1  CL 103 104  --   --  99  CO2 23  --   --   --   --   GLUCOSE 113* 132*  --   --  146*  BUN 19 13  --   --  12  CREATININE 1.13* 0.80  --   --   0.70  CALCIUM 10.4*  --   --   --   --    GFR: Estimated Creatinine Clearance: 66.1 mL/min (by C-G formula based on SCr of 0.7 mg/dL). Recent Labs  Lab 05/04/23 1057  WBC 6.3    Liver Function Tests: Recent Labs  Lab 05/04/23 1057  AST 41  ALT 48*  ALKPHOS 57  BILITOT 1.1  PROT 8.2*  ALBUMIN 4.6   No results for input(s): "LIPASE", "AMYLASE" in the last 168 hours. No results for input(s): "AMMONIA" in the last 168 hours.  ABG    Component Value Date/Time   PHART 7.375 05/06/2023 1023   PCO2ART 34.8 05/06/2023 1023   PO2ART 353 (H) 05/06/2023 1023   HCO3 21.9 05/06/2023 1028   TCO2 22 05/06/2023 1052   ACIDBASEDEF 3.0 (H) 05/06/2023 1028   O2SAT 74 05/06/2023 1028     Coagulation Profile: Recent Labs  Lab 05/04/23 1057  INR 1.0    Cardiac Enzymes: No results for input(s): "CKTOTAL", "CKMB", "CKMBINDEX", "TROPONINI" in the last 168 hours.  HbA1C: Hgb A1c MFr Bld  Date/Time Value Ref Range Status  05/04/2023 10:57 AM 5.7 (H) 4.8 - 5.6 % Final    Comment:    (NOTE) Pre diabetes:          5.7%-6.4%  Diabetes:              >6.4%  Glycemic control for   <7.0% adults with diabetes     CBG: No results for input(s): "GLUCAP" in the last 168 hours.  Review of Systems:   Patient is intubated; therefore, history has been obtained from chart review.    Past Medical History:  She,  has a past medical history of Anxiety, Aortic valve stenosis, Arthritis, Breast cancer (HCC) (2004), Cancer (HCC) (10/2002), Coronary artery disease, Dysrhythmia, GERD (gastroesophageal reflux disease), Heart murmur, History of kidney stones, HTN (hypertension), Hyperlipidemia, Morton neuroma, Personal history of chemotherapy (11/19/2002), Personal history of radiation therapy (02/19/2003), Pneumonia, Pre-diabetes, and Presence of permanent cardiac pacemaker (01/21/2023).   Surgical History:   Past Surgical History:  Procedure Laterality Date   BREAST BIOPSY Right 2019   BREAST  LUMPECTOMY Right    BREAST SURGERY  10/2002   lumpectomy / right side   COLONOSCOPY  2016   PACEMAKER IMPLANT N/A 01/21/2023   Procedure: PACEMAKER IMPLANT;  Surgeon: Duke Salvia, MD;  Location: [facility] INVASIVE CV LAB;  Service: Cardiovascular;  Laterality: N/A;   RIGHT/LEFT HEART CATH AND CORONARY ANGIOGRAPHY N/A 02/13/2021   Procedure: RIGHT/LEFT HEART CATH AND CORONARY ANGIOGRAPHY;  Surgeon: Kathleene Hazel, MD;  Location: [facility] INVASIVE CV LAB;  Service: Cardiovascular;  Laterality: N/A;   RIGHT/LEFT HEART CATH AND CORONARY ANGIOGRAPHY N/A 04/14/2023   Procedure: RIGHT/LEFT HEART CATH AND CORONARY ANGIOGRAPHY;  Surgeon: [place], Peter M, MD;  Location: [facility] INVASIVE CV LAB;  Service: Cardiovascular;  Laterality: N/A;     Social History:   reports that she has never smoked. She has never used smokeless tobacco. She reports that she does not currently use alcohol after a past usage of about 3.0 standard drinks of alcohol per week. She reports that she does not use drugs.   Family History:  Her family history includes Alzheimer's disease in her paternal aunt and paternal uncle; Bladder Cancer in her brother; Diabetes in her brother, maternal aunt, maternal uncle, and mother; Heart Problems in her maternal uncle; Heart attack in her maternal grandfather, paternal grandfather, and paternal grandmother; Heart disease in her brother, maternal aunt, mother, and paternal aunt; Kidney failure in her brother and mother; Lung cancer (age of onset: 85) in her father; Skin cancer in her father and paternal uncle. There is no history of Breast cancer.   Allergies Allergies  Allergen Reactions   Wound Dressing Adhesive     Blisters from bandages     Home Medications  Prior to Admission medications   Medication Sig Start Date End Date Taking? Authorizing Provider  acetaminophen (TYLENOL) 650 MG CR tablet Take 650 mg by mouth every 8 (eight) hours as needed for pain.   Yes [provider]   Calcium Carbonate (CALCI-CHEW PO) Take 1 tablet by mouth in the morning.   Yes [provider]  calcium carbonate (TUMS EX) 750 MG chewable tablet Chew 2 tablets by mouth daily as needed for heartburn.   Yes [provider]  cholecalciferol (VITAMIN D3) 25 MCG (1000 UNIT) tablet Take 1,000 Units by mouth daily.   Yes [provider]  escitalopram (LEXAPRO) 10 MG tablet Take 10 mg by mouth daily.   Yes [provider]  losartan (COZAAR) 25 MG tablet TAKE 1 TABLET(25 MG) BY MOUTH DAILY 04/14/23  Yes Little Ishikawa, MD  Polyethyl Glycol-Propyl Glycol (LUBRICATING EYE DROPS OP) Place 1 drop into both eyes daily as needed (dry eyes).   Yes [provider]  rosuvastatin (CRESTOR) 10 MG tablet Take 10 mg by mouth daily. 02/05/23  Yes [provider]  aspirin EC 81 MG tablet Take 1 tablet morning of procedure 04/07/23   Little Ishikawa, MD     Critical care time: 45 minutes    JD Anselm Lis [facility] 05/06/2023, 11:45 AM  Please see [email or web address] for pager details.  From 7A-7P if no response, please call [phone]. After hours, please call ELink [phone].

## 2023-05-06 NOTE — Transfer of Care (Signed)
Immediate Anesthesia Transfer of Care Note  Patient: [name]  Procedure(s) Performed: CORONARY ARTERY BYPASS GRAFTING (CABG) X ONE USING LEFT INTERNAL MAMMARY ARTERY (Chest) AORTIC VALVE REPLACEMENT (AVR) USING 21 MM INSPIRIS RESILIA AORTIC VALVE (Chest) TRANSESOPHAGEAL ECHOCARDIOGRAM  Patient Location: ICU  Anesthesia Type:General  Level of Consciousness: Patient remains intubated per anesthesia plan  Airway & Oxygen Therapy: Patient remains intubated per anesthesia plan and Patient placed on Ventilator (see vital sign flow sheet for setting)  Post-op Assessment: Report given to RN and Post -op Vital signs reviewed and stable  Post vital signs: Reviewed and stable  Last Vitals:  Vitals Value Taken Time  BP 105/58   Temp    Pulse 80 05/06/23 1310  Resp 16 05/06/23 1310  SpO2 98 % 05/06/23 1310  Vitals shown include unfiled device data.  Last Pain:  Vitals:   05/06/23 0705  TempSrc:   PainSc: 0-No pain         Complications: No notable events documented.

## 2023-05-06 NOTE — Op Note (Signed)
CARDIOVASCULAR SURGERY OPERATIVE NOTE  05/06/2023 [name] Al [id number]  Surgeon:  Ashley Akin, MD  First Assistant: Lowella Dandy [facility]                               An experienced assistant was required given the complexity of this surgery and the standard of surgical care. The assistant was needed for exposure, dissection, suctioning, retraction of delicate tissues and sutures, instrument exchange and for overall help during this procedure.     Preoperative Diagnosis:  Aortic Stenosis                                             Coronary artery disease  Postoperative Diagnosis:  Same   Procedure: Aortic Valve Replacement with a 21 mm Inspiris Pericardial Valve Coronary Artery bypass x 1 with the LIMA to the LAD  Anesthesia:  General Endotracheal   Clinical History/Surgical Indication: 67 yo female with NYHA class 2 symptoms of severe paradoxical low flow low gradient bicuspid AS who had low coronary ostia to be a good candidate for TAVR. She would best be served now with tissue SAVR and will need repeat cath to rule out CAD    Findings: The LV had normal function. The aortic valve was trileaflet and moderately calcified. The LAD was a 1.5 mm vessel and no distal disease. At end of procedure pt had well seated AVR with normal LV fucntion   Preparation:  The patient was seen in the preoperative holding area and the correct patient, correct operation were confirmed with the patient after reviewing the medical record and catheterization. The consent was signed by me. Preoperative antibiotics were given. A pulmonary arterial line and radial arterial line were placed by the anesthesia team. The patient was taken back to the operating room and positioned supine on the operating room table. After being placed under general endotracheal anesthesia by the anesthesia team a foley catheter was placed. The neck, chest, abdomen, and both legs were prepped with betadine soap and solution  and draped in the usual sterile manner. A surgical time-out was taken and the correct patient and operative procedure were confirmed with the nursing and anesthesia staff.  Operation: A median sternotomy incision was then created and the sternum divided the sternal saw.  The left internal mammary artery was then harvested and packed in papaverine soaked sponge.  Heparin was delivered.  Pericardial well was developed and the aortic cannulated with a 28 French sans cannula and a two-stage cannula was placed right atrium for venous return.  An antegrade cardioplegia catheter was placed in the ascending aorta with adequate confirmation of anticoagulation cardiopulmonary bypass was instituted. A left ventricular sump was placed across right superior pulmonary vein. Aortic cross-clamp was placed and cold blood [place] cardioplegia was delivered for 1200 cc.  A reanimation dose was delivered just prior to cross-clamp removal. The left internal mammary artery was then anastomosed to the LAD in an end-to-side anastomosis utilizing 8-0 Prolene sutures it was flushed de-aired and found to be hemostatic. The pedicle was pexed to the anterior surface of the heart with 5-0 Prolene sutures. The aortotomy was then performed and the aortic valve was identified photographed resected and then the annulus decalcified.  Utilizing fourteen 2-0 [place] pledgeted sutures with the pledgets on the ventricular surface the 21  mm Inspiris pericardial valve was secured with the cor knot system.  The aortotomy was then reconstructed with a running 4 oh pledgeted Prolene suture. The patient in the headdown position aortic cross-clamp was removed and ventricular and atrial pacing wires were brought out through inferior stab wounds and secured.  Following adequate de-airing the ventricular sump was removed and the patient was weaned from cardiopulmonary bypass on no inotropic support.  Protamine was delivered.  With adequate hemostasis  chest tubes were brought inferior stab wounds and secured the sternum was reapproximated with interrupted stainless steel wire and the presternal subcutaneous tissue and skin were closed in multiple layers absorbable suture.  Sterile dressings were applied

## 2023-05-06 NOTE — Anesthesia Procedure Notes (Signed)
Central Venous Catheter Insertion Performed by: Shelton Silvas, MD, anesthesiologist Start/End10/16/2024 7:55 AM, 05/06/2023 8:00 AM Patient location: Pre-op. Preanesthetic checklist: patient identified, IV checked, site marked, risks and benefits discussed, surgical consent, monitors and equipment checked, pre-op evaluation, timeout performed and anesthesia consent Hand hygiene performed  and maximum sterile barriers used  PA cath was placed.Swan type:thermodilution Procedure performed without using ultrasound guided technique. Attempts: 1 Patient tolerated the procedure well with no immediate complications.

## 2023-05-06 NOTE — Hospital Course (Addendum)
History of Present Illness:  [name] is a 67 yo female with a hx of breast cancer, hyperlipidemia who presents for follow-up. She was referred by Dr. Tracie Harrier for evaluation of heart murmur, initially seen on 10/24/2019. She reports that she followed at [facility] as a child, was told she had an issue with her aortic valve. She had a heart catheterization done in middle school. States that she followed with cardiology there until college, has not been seen for 40 years. Reports that she was told that everything resolved and she did not need further cardiology follow-up. She denies any chest pain or dyspnea. States that she walks about twice per week, usually from 45 minutes to an hour. Prior to pandemic was walking for 1 to 2 hours. She denies any lightheadedness, syncope, or palpitations. Does report occasional lower extremity edema. No history of smoking. Family history includes mother died of MI at age 60, and brother had CABG/AVR and died at age 17.  Her most recent echocardiogram showed progression of her aortic disease.  Catheterization was also performed and showed single vessel CAD.  She was referred to [facility] for surgical evaluation.  It was felt she would require AVR/CABG.  The risks and benefits of the procedure were explained to the patient and she was agreeable to proceed.    Hospital Course:   [name] presented to [facility] on 05/06/2023.  She was taken to the operating room and underwent CABG x 1 utilizing LIMA to LAD and Aortic Valve Replacement with a 21 mm Edwards Inspiris Resilia Valve.  She tolerated the procedure without difficulty and was taken to the SICU in stable condition.  Vital signs and hemodynamics remained stable.  She was extubated routinely on the afternoon of the day of surgery.  On the first postoperative day the monitoring lines were removed.  Diuresis was begun for expected volume excess.  She was mobilized.  Her chest tubes and  pacing wires were removed without difficulty.  She was maintaining NSR and paced by her PPM. She was felt stable for transfer to the progressive care unit on 05/08/2023 but was not able to get a bed until 10/19. She began ambulating well on RA. Her H/H was trending down to 7.3/22.5. She was asymptomatic, iron supplement was started. H and H was increased on 10/21 to 7.9 and 24.6. She will be continued on oral ferrous sulfate at discharge. She is maintaining sinus rhythm. Amiodarone (for a fib prophylaxis) will be decreased to 200 mg bid then daily at discharge. She has been ambulating on room air with good oxygenation. Her sternal wound is clean, dry, healing without signs of infection. Chest tube sutures will be removed today. She has been tolerating a diet and has had a bowel movement. She is stable for discharge today.

## 2023-05-06 NOTE — Anesthesia Preprocedure Evaluation (Addendum)
Anesthesia Evaluation  Patient identified by MRN, date of birth, ID band Patient awake    Reviewed: Allergy & Precautions, NPO status , Patient's Chart, lab work & pertinent test results  Airway Mallampati: III  TM Distance: <3 FB Neck ROM: Full    Dental  (+) Teeth Intact, Dental Advisory Given   Pulmonary    breath sounds clear to auscultation       Cardiovascular hypertension, Pt. on medications + CAD  + dysrhythmias + pacemaker + Valvular Problems/Murmurs AS  Rhythm:Regular Rate:Normal  Echo: 1. Severe AS (mean gradient 26 mmHg; AVA 0.79 cm2; DI 0.25).   2. Left ventricular ejection fraction, by estimation, is 55 to 60%. The  left ventricle has normal function. The left ventricle has no regional  wall motion abnormalities. There is mild left ventricular hypertrophy.  Left ventricular diastolic parameters  are consistent with Grade I diastolic dysfunction (impaired relaxation).  The average left ventricular global longitudinal strain is -17.6 %. The  global longitudinal strain is normal.   3. Right ventricular systolic function is normal. The right ventricular  size is normal.   4. The mitral valve is normal in structure. Mild mitral valve  regurgitation. No evidence of mitral stenosis.   5. The aortic valve is tricuspid. Aortic valve regurgitation is mild.  Severe aortic valve stenosis.   6. The inferior vena cava is normal in size with greater than 50%  respiratory variability, suggesting right atrial pressure of 3 mmHg.     Neuro/Psych   Anxiety      Neuromuscular disease    GI/Hepatic Neg liver ROS,GERD  ,,  Endo/Other  negative endocrine ROS    Renal/GU negative Renal ROS     Musculoskeletal  (+) Arthritis ,    Abdominal   Peds  Hematology negative hematology ROS (+)   Anesthesia Other Findings   Reproductive/Obstetrics                             Anesthesia  Physical Anesthesia Plan  ASA: 4  Anesthesia Plan: General   Post-op Pain Management:    Induction: Intravenous  PONV Risk Score and Plan: Ondansetron, Dexamethasone and Midazolam  Airway Management Planned: Oral ETT  Additional Equipment: Arterial line, CVP, PA Cath, TEE and Ultrasound Guidance Line Placement  Intra-op Plan:   Post-operative Plan: Post-operative intubation/ventilation  Informed Consent: I have reviewed the patients History and Physical, chart, labs and discussed the procedure including the risks, benefits and alternatives for the proposed anesthesia with the patient or authorized representative who has indicated his/her understanding and acceptance.     Dental advisory given  Plan Discussed with: CRNA  Anesthesia Plan Comments:        Anesthesia Quick Evaluation

## 2023-05-06 NOTE — Anesthesia Procedure Notes (Signed)
Central Venous Catheter Insertion Performed by: Shelton Silvas, MD, anesthesiologist Start/End10/16/2024 7:45 AM, 05/06/2023 7:55 AM Patient location: Pre-op. Preanesthetic checklist: patient identified, IV checked, site marked, risks and benefits discussed, surgical consent, monitors and equipment checked, pre-op evaluation, timeout performed and anesthesia consent Position: Trendelenburg Lidocaine 1% used for infiltration and patient sedated Hand hygiene performed , maximum sterile barriers used  and Seldinger technique used Catheter size: 8.5 Fr Total catheter length 10. Central line was placed.Sheath introducer Swan type:thermodilution PA Cath depth:50 Procedure performed using ultrasound guided technique. Ultrasound Notes:anatomy identified, needle tip was noted to be adjacent to the nerve/plexus identified, no ultrasound evidence of intravascular and/or intraneural injection and image(s) printed for medical record Attempts: 1 Following insertion, line sutured and dressing applied. Post procedure assessment: blood return through all ports, free fluid flow and no air  Patient tolerated the procedure well with no immediate complications.

## 2023-05-06 NOTE — Anesthesia Postprocedure Evaluation (Signed)
Anesthesia Post Note  Patient: Cade Bolick [place]  Procedure(s) Performed: CORONARY ARTERY BYPASS GRAFTING (CABG) X ONE USING LEFT INTERNAL MAMMARY ARTERY (Chest) AORTIC VALVE REPLACEMENT (AVR) USING 21 MM INSPIRIS RESILIA AORTIC VALVE (Chest) TRANSESOPHAGEAL ECHOCARDIOGRAM     Patient location during evaluation: SICU Anesthesia Type: General Level of consciousness: sedated Pain management: pain level controlled Vital Signs Assessment: post-procedure vital signs reviewed and stable Respiratory status: patient remains intubated per anesthesia plan Cardiovascular status: stable Postop Assessment: no apparent nausea or vomiting Anesthetic complications: no  No notable events documented.  Last Vitals:  Vitals:   05/06/23 1440 05/06/23 1445  BP: 104/64 105/80  Pulse: 80 80  Resp: 14 12  Temp: (!) 36.3 C (!) 36.4 C  SpO2: 100% 100%    Last Pain:  Vitals:   05/06/23 1315  TempSrc: Core  PainSc:                  Shelton Silvas

## 2023-05-06 NOTE — Interval H&P Note (Signed)
History and Physical Interval Note:  05/06/2023 6:49 AM  [name]  has presented today for surgery, with the diagnosis of SEVERE AS.  The various methods of treatment have been discussed with the patient and family. After consideration of risks, benefits and other options for treatment, the patient has consented to  Procedure(s): CORONARY ARTERY BYPASS GRAFTING (CABG) (N/A) AORTIC VALVE REPLACEMENT (AVR) (N/A) TRANSESOPHAGEAL ECHOCARDIOGRAM (N/A) as a surgical intervention.  The patient's history has been reviewed, patient examined, no change in status, stable for surgery.  I have reviewed the patient's chart and labs.  Questions were answered to the patient's satisfaction.     Eugenio Hoes

## 2023-05-06 NOTE — Brief Op Note (Signed)
05/06/2023  8:50 AM  PATIENT:  [name]  67 y.o. female  PRE-OPERATIVE DIAGNOSIS:  SEVERE AORTIC  STENOSIS  POST-OPERATIVE DIAGNOSIS:  SEVERE AORTIC STENOSIS  PROCEDURE:  Procedure(s):  CORONARY ARTERY BYPASS GRAFTING x 1 -LIMA to LAD  AORTIC VALVE REPLACEMENT  - 21 mm Edwards Inspiris Resilia Valve  TRANSESOPHAGEAL ECHOCARDIOGRAM (N/A)  SURGEON:  Surgeons and Role:    Eugenio Hoes, MD - Primary  PHYSICIAN ASSISTANT: Lowella Dandy PA-C  ASSISTANTS: Valene Bors Student RNFA   ANESTHESIA:   general  EBL:  Per Anesthesia Record  BLOOD ADMINISTERED:   CC CELLSAVER  DRAINS:  Left Pleural Chest Tube, Mediastinal Chest Drain    LOCAL MEDICATIONS USED:  NONE  SPECIMEN:  Source of Specimen:  Aortic Valve Leaflets  DISPOSITION OF SPECIMEN:  PATHOLOGY  COUNTS:  YES  TOURNIQUET:  * No tourniquets in log *  DICTATION: .Dragon Dictation  PLAN OF CARE: Admit to inpatient   PATIENT DISPOSITION:  ICU - intubated and hemodynamically stable.   Delay start of Pharmacological VTE agent (>24hrs) due to surgical blood loss or risk of bleeding: yes

## 2023-05-06 NOTE — Procedures (Addendum)
Extubation Procedure Note  Patient Details:   Name: Briteny Fulghum DOB: [birth year] MRN: [id number]   Airway Documentation:    Vent end date: 05/06/23 Vent end time: 1532   Evaluation  O2 sats: stable throughout Complications: No apparent complications Patient did tolerate procedure well. Bilateral Breath Sounds: Clear, Diminished   Yes Pt extubated to 3L [place] FVC: 540 mL NIF: -27 cmH2O Positive cuff leak noted  Remy Voiles V 05/06/2023, 3:33 PM

## 2023-05-06 NOTE — Anesthesia Procedure Notes (Signed)
Arterial Line Insertion Start/End10/16/2024 8:05 AM, 05/06/2023 8:10 AM Performed by: Randon Goldsmith, CRNA, CRNA  Patient location: Pre-op. Preanesthetic checklist: patient identified, IV checked, site marked, risks and benefits discussed, surgical consent, monitors and equipment checked, pre-op evaluation, timeout performed and anesthesia consent Lidocaine 1% used for infiltration Left, radial was placed Catheter size: 20 G Hand hygiene performed , maximum sterile barriers used  and Seldinger technique used Allen's test indicative of satisfactory collateral circulation Attempts: 1 Procedure performed without using ultrasound guided technique. Following insertion, dressing applied. Post procedure assessment: normal and unchanged  Patient tolerated the procedure well with no immediate complications.

## 2023-05-07 ENCOUNTER — Encounter (HOSPITAL_COMMUNITY): Payer: Self-pay | Admitting: Thoracic Surgery (Cardiothoracic Vascular Surgery)

## 2023-05-07 ENCOUNTER — Inpatient Hospital Stay (HOSPITAL_COMMUNITY): Payer: Medicare Other

## 2023-05-07 DIAGNOSIS — R739 Hyperglycemia, unspecified: Secondary | ICD-10-CM | POA: Diagnosis not present

## 2023-05-07 DIAGNOSIS — Z952 Presence of prosthetic heart valve: Secondary | ICD-10-CM | POA: Diagnosis not present

## 2023-05-07 DIAGNOSIS — Z951 Presence of aortocoronary bypass graft: Secondary | ICD-10-CM

## 2023-05-07 LAB — GLUCOSE, CAPILLARY
Glucose-Capillary: 127 mg/dL — ABNORMAL HIGH (ref 70–99)
Glucose-Capillary: 132 mg/dL — ABNORMAL HIGH (ref 70–99)
Glucose-Capillary: 136 mg/dL — ABNORMAL HIGH (ref 70–99)
Glucose-Capillary: 143 mg/dL — ABNORMAL HIGH (ref 70–99)
Glucose-Capillary: 145 mg/dL — ABNORMAL HIGH (ref 70–99)
Glucose-Capillary: 146 mg/dL — ABNORMAL HIGH (ref 70–99)
Glucose-Capillary: 148 mg/dL — ABNORMAL HIGH (ref 70–99)
Glucose-Capillary: 149 mg/dL — ABNORMAL HIGH (ref 70–99)
Glucose-Capillary: 154 mg/dL — ABNORMAL HIGH (ref 70–99)
Glucose-Capillary: 154 mg/dL — ABNORMAL HIGH (ref 70–99)
Glucose-Capillary: 157 mg/dL — ABNORMAL HIGH (ref 70–99)
Glucose-Capillary: 158 mg/dL — ABNORMAL HIGH (ref 70–99)
Glucose-Capillary: 158 mg/dL — ABNORMAL HIGH (ref 70–99)

## 2023-05-07 LAB — BASIC METABOLIC PANEL
Anion gap: 9 (ref 5–15)
Anion gap: 11 (ref 5–15)
BUN: 11 mg/dL (ref 8–23)
BUN: 10 mg/dL (ref 8–23)
CO2: 23 mmol/L (ref 22–32)
CO2: 25 mmol/L (ref 22–32)
Calcium: 7.8 mg/dL — ABNORMAL LOW (ref 8.9–10.3)
Calcium: 8.1 mg/dL — ABNORMAL LOW (ref 8.9–10.3)
Chloride: 103 mmol/L (ref 98–111)
Chloride: 98 mmol/L (ref 98–111)
Creatinine, Ser: 0.99 mg/dL (ref 0.44–1.00)
Creatinine, Ser: 0.9 mg/dL (ref 0.44–1.00)
GFR, Estimated: >60 mL/min (ref >60)
GFR, Estimated: >60 mL/min (ref >60)
Glucose, Bld: 137 mg/dL — ABNORMAL HIGH (ref 70–99)
Glucose, Bld: 149 mg/dL — ABNORMAL HIGH (ref 70–99)
Potassium: 4.1 mmol/L (ref 3.5–5.1)
Potassium: 3.8 mmol/L (ref 3.5–5.1)
Sodium: 135 mmol/L (ref 135–145)
Sodium: 134 mmol/L — ABNORMAL LOW (ref 135–145)

## 2023-05-07 LAB — CREATININE, SERUM
Creatinine, Ser: 1.02 mg/dL — ABNORMAL HIGH (ref 0.44–1.00)
GFR, Estimated: >60 mL/min (ref >60)

## 2023-05-07 LAB — MAGNESIUM
Magnesium: 2.2 mg/dL (ref 1.7–2.4)
Magnesium: 2 mg/dL (ref 1.7–2.4)

## 2023-05-07 LAB — CBC
HCT: 25.2 % — ABNORMAL LOW (ref 36.0–46.0)
HCT: 25.7 % — ABNORMAL LOW (ref 36.0–46.0)
HCT: 25.6 % — ABNORMAL LOW (ref 36.0–46.0)
Hemoglobin: 8.3 g/dL — ABNORMAL LOW (ref 12.0–15.0)
Hemoglobin: 8.3 g/dL — ABNORMAL LOW (ref 12.0–15.0)
Hemoglobin: 8.2 g/dL — ABNORMAL LOW (ref 12.0–15.0)
MCH: 28 pg (ref 26.0–34.0)
MCH: 28.5 pg (ref 26.0–34.0)
MCH: 27.4 pg (ref 26.0–34.0)
MCHC: 32.9 g/dL (ref 30.0–36.0)
MCHC: 32.3 g/dL (ref 30.0–36.0)
MCHC: 32 g/dL (ref 30.0–36.0)
MCV: 85.1 fL (ref 80.0–100.0)
MCV: 88.3 fL (ref 80.0–100.0)
MCV: 85.6 fL (ref 80.0–100.0)
Platelets: 144 10*3/uL — ABNORMAL LOW (ref 150–400)
Platelets: 140 10*3/uL — ABNORMAL LOW (ref 150–400)
Platelets: 133 10*3/uL — ABNORMAL LOW (ref 150–400)
RBC: 2.96 MIL/uL — ABNORMAL LOW (ref 3.87–5.11)
RBC: 2.91 MIL/uL — ABNORMAL LOW (ref 3.87–5.11)
RBC: 2.99 MIL/uL — ABNORMAL LOW (ref 3.87–5.11)
RDW: 13.9 % (ref 11.5–15.5)
RDW: 13.9 % (ref 11.5–15.5)
RDW: 14 % (ref 11.5–15.5)
WBC: 7.8 10*3/uL (ref 4.0–10.5)
WBC: 7.8 10*3/uL (ref 4.0–10.5)
WBC: 9.6 10*3/uL (ref 4.0–10.5)
nRBC: 0 % (ref 0.0–0.2)
nRBC: 0 % (ref 0.0–0.2)
nRBC: 0 % (ref 0.0–0.2)

## 2023-05-07 MED ORDER — INSULIN ASPART 100 UNIT/ML IJ SOLN
0.0000 [IU] | INTRAMUSCULAR | Status: DC
  Administered 2023-05-07 – 2023-05-09 (×9): 2 [IU] via SUBCUTANEOUS

## 2023-05-07 MED ORDER — FUROSEMIDE 10 MG/ML IJ SOLN
40.0000 mg | Freq: Once | INTRAMUSCULAR | Status: AC
  Administered 2023-05-07: 40 mg via INTRAVENOUS
  Filled 2023-05-07: qty 4

## 2023-05-07 MED ORDER — ENOXAPARIN SODIUM 40 MG/0.4ML IJ SOSY
40.0000 mg | PREFILLED_SYRINGE | Freq: Every day | INTRAMUSCULAR | Status: DC
  Administered 2023-05-07 – 2023-05-10 (×4): 40 mg via SUBCUTANEOUS
  Filled 2023-05-07 (×4): qty 0.4

## 2023-05-07 NOTE — Progress Notes (Signed)
NAME:  [name], MRN:  [id number], DOB:  1956/01/15, LOS: 1 ADMISSION DATE:  05/06/2023, CONSULTATION DATE:  10/16 REFERRING MD:  Dr. Leafy Ro, CHIEF COMPLAINT:  CABG x1; aortic valve stenosis s/p AVR  History of Present Illness:  Patient is a 67 yo F w/ pertinent PMH aortic valve stenosis w/ bicuspid valve, high grade degree AV block w/ PPM, CAD, HTN, HLD presents to [facility] on 10/16 for elective AVR.  Patient been followed by cardiology. Echo showing AS. Had heart block a few months ago requiring PPM. Having worsening fatigue and DOE. TCTS following and plan for AVR.  10/16 admitted for elective AVR. Post op intubated/sedated and transferred to [facility] ICU. PCCM consulted.  Pertinent  Medical History   Past Medical History:  Diagnosis Date   Anxiety    Aortic valve stenosis    Arthritis    Breast cancer (HCC) 2004   right breast; ER/PR+, Her2+   Cancer (HCC) 10/2002   right breast lumpectomy   Coronary artery disease    Dysrhythmia    2nd Degree AV block   GERD (gastroesophageal reflux disease)    Heart murmur    History of kidney stones    HX   HTN (hypertension)    Hyperlipidemia    [place] neuroma    Personal history of chemotherapy 11/19/2002   Personal history of radiation therapy 02/19/2003   Pneumonia    Pre-diabetes    Presence of permanent cardiac pacemaker 01/21/2023   [employer]     Significant Hospital Events: Including procedures, antibiotic start and stop dates in addition to other pertinent events   10/16 AVR and CABGx1  Interim History / Subjective:  She denies complaints, pain is moderate. Tmax 100.4.  Objective   Blood pressure 108/66, pulse 92, temperature 99 F (37.2 C), resp. rate 20, height 5' 3.5" (1.613 m), weight 78.4 kg, SpO2 98%. PAP: (25-45)/(12-31) 41/18 CO:  [2.7 L/min-4.6 L/min] 4.6 L/min CI:  [1.5 L/min/m2-2.64 L/min/m2] 2.64 L/min/m2  Vent Mode: PSV;CPAP FiO2 (%):  [40 %-50 %] 40 % Set Rate:  [4 bmp-16 bmp] 4 bmp Vt Set:   [420 mL] 420 mL PEEP:  [5 cmH20] 5 cmH20 Pressure Support:  [10 cmH20] 10 cmH20 Plateau Pressure:  [15 cmH20-25 cmH20] 25 cmH20   Intake/Output Summary (Last 24 hours) at 05/07/2023 0714 Last data filed at 05/07/2023 0600 Gross per 24 hour  Intake 5132.6 ml  Output 2575 ml  Net 2557.6 ml   Filed Weights   05/06/23 0635 05/07/23 0631  Weight: 73 kg 78.4 kg    Examination: General: elderly woman sitting up in bed in NAD HEENT: [place]/AT, eyes anicteric Neuro: awake, alert CV: s1S2, paced rhythm. Chest tubes with minimal bloody output PULM:  breathing comfortably on [place], CTAB GI: soft, NT  Extremities: mild hand edema, no cyanosis Skin: warm, dry, no rashes  BUN 11 Cr 0.99 WBC 7.8 H/H 8.3/25.2 Platelets 144  Chest tube output 690cc  CXR personally reviewed> RIJ introducer with swan, PPM, chest tubes. Mild pulmonary edema.   Resolved Hospital Problem list     Assessment & Plan:    Severe AS, bicuspid AV s/p AVR 1-vessel CAD, CABG x 1; baseline EF 55-60% -post op care per TCTS -start metoprolol today -aspirin, statin -complete post-op antibiotics -progressive mobility -pain control per protocol- morphine, oxy, tramadol -pulmonary hygiene, progressive mobility -lasix x 1 dose -d/c swan   Hyperglycemia; h/o prediabetes. A1c 6.7 -transition to SSI PRN -goal BG <180   ABLA- expected post-op  Consumptive thrombocytopenia- expected post-op Mild consumptive coagulopathy- expected post-op -monitor, no current indication for transfusions   Prolonged Qtc -avoid Qtc prolonging meds   H/o HTN -con't holding PTA losartan -start metoprolol today   Best Practice (right click and "Reselect all SmartList Selections" daily)   Diet/type: advance diet as tolerated DVT prophylaxis: lovenox GI prophylaxis: PPI Lines: Central line; d/w swan Foley:  Yes, and it is still needed Code Status:  full code Last date of multidisciplinary goals of care discussion [per  primary]  Labs   CBC: Recent Labs  Lab 05/04/23 1057 05/06/23 1003 05/06/23 1116 05/06/23 1152 05/06/23 1327 05/06/23 1524 05/06/23 1633 05/06/23 1855 05/07/23 0430  WBC 6.3  --   --   --  11.0*  --   --  8.3 7.8  HGB 13.3   < > 7.4*   < > 8.1* 8.5* 7.8* 8.2* 8.3*  HCT 40.7   < > 22.9*   < > 25.0* 25.0* 23.0* 24.8* 25.2*  MCV 86.6  --   --   --  86.2  --   --  87.3 85.1  PLT 333  --  190  --  139*  --   --  136* 144*   < > = values in this interval not displayed.    Basic Metabolic Panel: Recent Labs  Lab 05/04/23 1057 05/06/23 1003 05/06/23 1023 05/06/23 1052 05/06/23 1152 05/06/23 1155 05/06/23 1325 05/06/23 1524 05/06/23 1633 05/06/23 1855 05/07/23 0430  NA 137 138   < > 134*   < > 136 139 138 140 136 135  K 4.5 4.3   < > 5.1   < > 4.8 4.0 4.7 4.3 4.2 4.1  CL 103 104  --  99  --  103  --   --   --  104 103  CO2 23  --   --   --   --   --   --   --   --  24 23  GLUCOSE 113* 132*  --  146*  --  173*  --   --   --  148* 137*  BUN 19 13  --  12  --  11  --   --   --  10 11  CREATININE 1.13* 0.80  --  0.70  --  0.80  --   --   --  0.99 0.99  CALCIUM 10.4*  --   --   --   --   --   --   --   --  7.7* 7.8*  MG  --   --   --   --   --   --   --   --   --  3.2* 2.2   < > = values in this interval not displayed.   GFR: Estimated Creatinine Clearance: 55.3 mL/min (by C-G formula based on SCr of 0.99 mg/dL). Recent Labs  Lab 05/04/23 1057 05/06/23 1327 05/06/23 1855 05/07/23 0430  WBC 6.3 11.0* 8.3 7.8    Liver Function Tests: Recent Labs  Lab 05/04/23 1057  AST 41  ALT 48*  ALKPHOS 57  BILITOT 1.1  PROT 8.2*  ALBUMIN 4.6     Critical care time:       Steffanie Dunn, DO 05/07/23 8:01 AM [facility]  For contact information, see Amion. If no response to pager, please call PCCM consult pager. After hours, 7PM- 7AM, please call Elink.

## 2023-05-07 NOTE — TOC CM/SW Note (Signed)
Transition of Care [facility]) - Inpatient Brief Assessment   Patient Details  Name: [name] MRN: [id number] Date of Birth: 01-09-1956  [facility] CM/SW Contact:    Gala Lewandowsky, RN Phone Number: 05/07/2023, 3:35 PM   Clinical Narrative: Patient discussed in progression rounds. Patient POD-1 AVR. Case Manager will continue to follow for transition of care needs as the patient progresses.    Transition of Care Asessment: Insurance and Status: Insurance coverage has been reviewed Patient has primary care physician: Yes Prior/Current Home Services: No current home services Social Determinants of Health Reivew: SDOH reviewed no interventions necessary Readmission risk has been reviewed: Yes Transition of care needs: no transition of care needs at this time

## 2023-05-07 NOTE — Progress Notes (Signed)
[address]       [place] [address]             [phone]      1 Day Post-Op  Procedure(s) (LRB): CORONARY ARTERY BYPASS GRAFTING (CABG) X ONE USING LEFT INTERNAL MAMMARY ARTERY (N/A) AORTIC VALVE REPLACEMENT (AVR) USING 21 MM INSPIRIS RESILIA AORTIC VALVE (N/A) TRANSESOPHAGEAL ECHOCARDIOGRAM (N/A)   Total Length of Stay:  LOS: 1 day    SUBJECTIVE: Feels well No issues overnight  Vitals:   05/07/23 0630 05/07/23 0700  BP:    Pulse: 92 85  Resp: 20 11  Temp: 99 F (37.2 C) 99.1 F (37.3 C)  SpO2: 98% 97%    Intake/Output      10/16 0701 10/17 0700 10/17 0701 10/18 0700   P.O. 300    I.V. (mL/kg) 2339.9 (29.8)    Blood 450    NG/GT 100    IV Piggyback 1942.7    Total Intake(mL/kg) 5132.6 (65.5)    Urine (mL/kg/hr) 1885 (1)    Chest Tube 690    Total Output 2575    Net +2557.6             sodium chloride     albumin human Stopped (05/06/23 1410)    ceFAZolin (ANCEF) IV 2 g (05/07/23 0604)   dexmedetomidine (PRECEDEX) IV infusion Stopped (05/06/23 1428)   insulin 0.8 Units/hr (05/07/23 0600)   lactated ringers     lactated ringers 20 mL/hr at 05/07/23 0600   phenylephrine (NEO-SYNEPHRINE) Adult infusion Stopped (05/06/23 2332)    CBC    Component Value Date/Time   WBC 7.8 05/07/2023 0430   RBC 2.96 (L) 05/07/2023 0430   HGB 8.3 (L) 05/07/2023 0430   HGB 13.4 04/07/2023 1056   HGB 12.6 02/01/2015 1430   HCT 25.2 (L) 05/07/2023 0430   HCT 41.4 04/07/2023 1056   HCT 37.9 02/01/2015 1430   PLT 144 (L) 05/07/2023 0430   PLT 341 04/07/2023 1056   MCV 85.1 05/07/2023 0430   MCV 87 04/07/2023 1056   MCV 83.2 02/01/2015 1430   MCH 28.0 05/07/2023 0430   MCHC 32.9 05/07/2023 0430   RDW 13.9 05/07/2023 0430   RDW 13.6 04/07/2023 1056   RDW 15.1 (H) 02/01/2015 1430   LYMPHSABS 2.0 04/07/2023 1056   LYMPHSABS 1.3 02/01/2015 1430   MONOABS 0.4 02/01/2015 1430   EOSABS 0.1 04/07/2023 1056   BASOSABS 0.1 04/07/2023 1056    BASOSABS 0.0 02/01/2015 1430   CMP     Component Value Date/Time   NA 135 05/07/2023 0430   NA 138 04/07/2023 1056   NA 143 02/01/2015 1430   K 4.1 05/07/2023 0430   K 3.7 02/01/2015 1430   CL 103 05/07/2023 0430   CO2 23 05/07/2023 0430   CO2 28 02/01/2015 1430   GLUCOSE 137 (H) 05/07/2023 0430   GLUCOSE 126 02/01/2015 1430   BUN 11 05/07/2023 0430   BUN 22 04/07/2023 1056   BUN 9.9 02/01/2015 1430   CREATININE 0.99 05/07/2023 0430   CREATININE 0.8 02/01/2015 1430   CALCIUM 7.8 (L) 05/07/2023 0430   CALCIUM 9.6 02/01/2015 1430   PROT 8.2 (H) 05/04/2023 1057   PROT 7.4 02/01/2015 1430   ALBUMIN 4.6 05/04/2023 1057   ALBUMIN 4.0 02/01/2015 1430   AST 41 05/04/2023 1057   AST 28 02/01/2015 1430   ALT 48 (H) 05/04/2023 1057   ALT 48 02/01/2015 1430   ALKPHOS 57 05/04/2023 1057  ALKPHOS 107 02/01/2015 1430   BILITOT 1.1 05/04/2023 1057   BILITOT 0.32 02/01/2015 1430   GFRNONAA >60 05/07/2023 0430   ABG    Component Value Date/Time   PHART 7.365 05/06/2023 1633   PCO2ART 44.7 05/06/2023 1633   PO2ART 120 (H) 05/06/2023 1633   HCO3 25.5 05/06/2023 1633   TCO2 27 05/06/2023 1633   ACIDBASEDEF 7.0 (H) 05/06/2023 1524   O2SAT 98 05/06/2023 1633   CBG (last 3)  Recent Labs    05/07/23 0302 05/07/23 0431 05/07/23 0541  GLUCAP 143* 149* 145*  EXAM Lungs: clear Card: RR Ext; warm Neuro: awake and alert   ASSESSMENT: POD #1 SP AVR CABG Hemodynamics ok. Will dc swan and aline Pulm: need ISB  atelectasis on CXR Diurese today Ambulate   Eugenio Hoes, MD 05/07/2023

## 2023-05-07 NOTE — Plan of Care (Signed)
  Problem: Cardiovascular: Goal: Ability to achieve and maintain adequate cardiovascular perfusion will improve Outcome: Progressing Goal: Vascular access site(s) Level 0-1 will be maintained Outcome: Progressing   Problem: Activity: Goal: Risk for activity intolerance will decrease Outcome: Progressing   Problem: Nutrition: Goal: Adequate nutrition will be maintained Outcome: Progressing

## 2023-05-07 NOTE — Discharge Summary (Signed)
discharge. She has been ambulating on room air with good oxygenation. Her sternal wound is clean, dry, healing without signs of infection. Chest tube sutures will be removed today. She has been tolerating a diet and has had a bowel movement. She is stable for discharge today.  Consults: pulmonary/intensive care  Significant Diagnostic Studies:  RIGHT/LEFT HEART CATH AND CORONARY ANGIOGRAPHY   Conclusion  Single vessel obstructive CAD. New 90% stenosis in the mid LAD Severe aortic stenosis.  Mean AV gradient 27 mm Hg. AVA 0.9 cm squared with index 0.5. Mild- moderately elevated LV filling pressures. PCWP 29/28 with mean 20 mm Hg. LVEDP 25 mm Hg Mild pulmonary HTN. PAP 39/22 with mean 31 mm Hg Normal cardiac output 4.78 L/min index 2.7   Plan: proceed with AVR and LIMA to LAD   ECHOCARDIOGRAM REPORT       Patient Name:   Phelicia Ladwig Date of Exam: 11/12/2022  Medical Rec #:  [id number]          Height:       63.0 in  Accession #:    [id number]         Weight:       153.0 lb  Date of Birth:  05-02-1956          BSA:          1.726 m  Patient Age:    67 years           BP:           100/68 mmHg  Patient Gender: F                  HR:           59 bpm.  Exam Location:  [address]   Procedure: 2D Echo, 3D Echo, Cardiac Doppler, Color Doppler and Strain  Analysis   Indications:   I35.0 Aortic Stenosis    History:        Patient has prior history of Echocardiogram examinations,  most                 recent 06/30/2022. Aortic Valve Disease,                  Signs/Symptoms:Dizziness/Lightheadedness and Shortness of                  Breath; Risk Factors:Family History of Coronary Artery  Disease                 and Dyslipidemia. *New Symptoms (within last 2-3 months)                  Dizziness and Chest Pressure                  History of Right Breast Cancer (2004) status post  Lumpectomy,                 Chemotherapy and Radiation.    Sonographer:    Farrel Conners RDCS  Referring Phys: Bjorn Pippin, CHRISTOPHER, L   IMPRESSIONS     1. Severe AS (mean gradient 26 mmHg; AVA 0.79 cm2; DI 0.25).   2. Left ventricular ejection fraction, by estimation, is 55 to 60%. The  left ventricle has normal function. The left ventricle has no regional  wall motion abnormalities. There is mild left ventricular hypertrophy.  Left ventricular diastolic parameters  are consistent with Grade I diastolic dysfunction (impaired relaxation).  The average left ventricular global  discharge. She has been ambulating on room air with good oxygenation. Her sternal wound is clean, dry, healing without signs of infection. Chest tube sutures will be removed today. She has been tolerating a diet and has had a bowel movement. She is stable for discharge today.  Consults: pulmonary/intensive care  Significant Diagnostic Studies:  RIGHT/LEFT HEART CATH AND CORONARY ANGIOGRAPHY   Conclusion  Single vessel obstructive CAD. New 90% stenosis in the mid LAD Severe aortic stenosis.  Mean AV gradient 27 mm Hg. AVA 0.9 cm squared with index 0.5. Mild- moderately elevated LV filling pressures. PCWP 29/28 with mean 20 mm Hg. LVEDP 25 mm Hg Mild pulmonary HTN. PAP 39/22 with mean 31 mm Hg Normal cardiac output 4.78 L/min index 2.7   Plan: proceed with AVR and LIMA to LAD   ECHOCARDIOGRAM REPORT       Patient Name:   Phelicia Ladwig Date of Exam: 11/12/2022  Medical Rec #:  [id number]          Height:       63.0 in  Accession #:    [id number]         Weight:       153.0 lb  Date of Birth:  05-02-1956          BSA:          1.726 m  Patient Age:    67 years           BP:           100/68 mmHg  Patient Gender: F                  HR:           59 bpm.  Exam Location:  [address]   Procedure: 2D Echo, 3D Echo, Cardiac Doppler, Color Doppler and Strain  Analysis   Indications:   I35.0 Aortic Stenosis    History:        Patient has prior history of Echocardiogram examinations,  most                 recent 06/30/2022. Aortic Valve Disease,                  Signs/Symptoms:Dizziness/Lightheadedness and Shortness of                  Breath; Risk Factors:Family History of Coronary Artery  Disease                 and Dyslipidemia. *New Symptoms (within last 2-3 months)                  Dizziness and Chest Pressure                  History of Right Breast Cancer (2004) status post  Lumpectomy,                 Chemotherapy and Radiation.    Sonographer:    Farrel Conners RDCS  Referring Phys: Bjorn Pippin, CHRISTOPHER, L   IMPRESSIONS     1. Severe AS (mean gradient 26 mmHg; AVA 0.79 cm2; DI 0.25).   2. Left ventricular ejection fraction, by estimation, is 55 to 60%. The  left ventricle has normal function. The left ventricle has no regional  wall motion abnormalities. There is mild left ventricular hypertrophy.  Left ventricular diastolic parameters  are consistent with Grade I diastolic dysfunction (impaired relaxation).  The average left ventricular global  discharge. She has been ambulating on room air with good oxygenation. Her sternal wound is clean, dry, healing without signs of infection. Chest tube sutures will be removed today. She has been tolerating a diet and has had a bowel movement. She is stable for discharge today.  Consults: pulmonary/intensive care  Significant Diagnostic Studies:  RIGHT/LEFT HEART CATH AND CORONARY ANGIOGRAPHY   Conclusion  Single vessel obstructive CAD. New 90% stenosis in the mid LAD Severe aortic stenosis.  Mean AV gradient 27 mm Hg. AVA 0.9 cm squared with index 0.5. Mild- moderately elevated LV filling pressures. PCWP 29/28 with mean 20 mm Hg. LVEDP 25 mm Hg Mild pulmonary HTN. PAP 39/22 with mean 31 mm Hg Normal cardiac output 4.78 L/min index 2.7   Plan: proceed with AVR and LIMA to LAD   ECHOCARDIOGRAM REPORT       Patient Name:   Phelicia Ladwig Date of Exam: 11/12/2022  Medical Rec #:  [id number]          Height:       63.0 in  Accession #:    [id number]         Weight:       153.0 lb  Date of Birth:  05-02-1956          BSA:          1.726 m  Patient Age:    67 years           BP:           100/68 mmHg  Patient Gender: F                  HR:           59 bpm.  Exam Location:  [address]   Procedure: 2D Echo, 3D Echo, Cardiac Doppler, Color Doppler and Strain  Analysis   Indications:   I35.0 Aortic Stenosis    History:        Patient has prior history of Echocardiogram examinations,  most                 recent 06/30/2022. Aortic Valve Disease,                  Signs/Symptoms:Dizziness/Lightheadedness and Shortness of                  Breath; Risk Factors:Family History of Coronary Artery  Disease                 and Dyslipidemia. *New Symptoms (within last 2-3 months)                  Dizziness and Chest Pressure                  History of Right Breast Cancer (2004) status post  Lumpectomy,                 Chemotherapy and Radiation.    Sonographer:    Farrel Conners RDCS  Referring Phys: Bjorn Pippin, CHRISTOPHER, L   IMPRESSIONS     1. Severe AS (mean gradient 26 mmHg; AVA 0.79 cm2; DI 0.25).   2. Left ventricular ejection fraction, by estimation, is 55 to 60%. The  left ventricle has normal function. The left ventricle has no regional  wall motion abnormalities. There is mild left ventricular hypertrophy.  Left ventricular diastolic parameters  are consistent with Grade I diastolic dysfunction (impaired relaxation).  The average left ventricular global  Physician Discharge Summary  Patient ID: Bethann Kawa MRN: [id number] DOB/AGE: 1955-11-11 67 y.o.  Admit date: 05/06/2023 Discharge date: 05/11/2023  Admission Diagnoses:  Patient Active Problem List   Diagnosis Date Noted   S/P CABG x 1 05/06/2023   S/P AVR 05/06/2023   Nonrheumatic aortic valve stenosis    Encounter for screening mammogram for high-risk patient 02/02/2015   Low bone density 02/02/2015   Nevus of back 02/02/2015   Breast cancer (HCC) 05/16/2011      Discharge Diagnoses:  Patient Active Problem List   Diagnosis Date Noted   S/P CABG x 1 05/06/2023   S/P AVR 05/06/2023   Nonrheumatic aortic valve stenosis    Encounter for screening mammogram for high-risk patient 02/02/2015   Low bone density 02/02/2015   Nevus of back 02/02/2015   Breast cancer (HCC) 05/16/2011      S/P CABG x 1   Discharged Condition: stable  History of Present Illness:  Nebula Porten is a 67 yo female with a hx of breast cancer, hyperlipidemia who presents for follow-up. She was referred by Dr. Tracie Harrier for evaluation of heart murmur, initially seen on 10/24/2019. She reports that she followed at [facility] as a child, was told she had an issue with her aortic valve. She had a heart catheterization done in middle school. States that she followed with cardiology there until college, has not been seen for 40 years. Reports that she was told that everything resolved and she did not need further cardiology follow-up. She denies any chest pain or dyspnea. States that she walks about twice per week, usually from 45 minutes to an hour. Prior to pandemic was walking for 1 to 2 hours. She denies any lightheadedness, syncope, or palpitations. Does report occasional lower extremity edema. No history of smoking. Family history includes mother died of MI at age 53, and brother had CABG/AVR and died at age 24.  Her most recent echocardiogram showed progression of her aortic disease.  Catheterization  was also performed and showed single vessel CAD.  She was referred to [facility] for surgical evaluation.  It was felt she would require AVR/CABG.  The risks and benefits of the procedure were explained to the patient and she was agreeable to proceed.    Hospital Course:   Jacia Wingert presented to [facility] on 05/06/2023.  She was taken to the operating room and underwent CABG x 1 utilizing LIMA to LAD and Aortic Valve Replacement with a 21 mm Edwards Inspiris Resilia Valve.  She tolerated the procedure without difficulty and was taken to the SICU in stable condition.  Vital signs and hemodynamics remained stable.  She was extubated routinely on the afternoon of the day of surgery.  On the first postoperative day the monitoring lines were removed.  Diuresis was begun for expected volume excess.  She was mobilized.  Her chest tubes and pacing wires were removed without difficulty.  She was maintaining NSR and paced by her PPM. She was felt stable for transfer to the progressive care unit on 05/08/2023 but was not able to get a bed until 10/19. She began ambulating well on RA. Her H/H was trending down to 7.3/22.5. She was asymptomatic, iron supplement was started. H and H was increased on 10/21 to 7.9 and 24.6. She will be continued on oral ferrous sulfate at discharge. She is maintaining sinus rhythm. Amiodarone (for a fib prophylaxis) will be decreased to 200 mg bid then daily at  Physician Discharge Summary  Patient ID: Bethann Kawa MRN: [id number] DOB/AGE: 1955-11-11 67 y.o.  Admit date: 05/06/2023 Discharge date: 05/11/2023  Admission Diagnoses:  Patient Active Problem List   Diagnosis Date Noted   S/P CABG x 1 05/06/2023   S/P AVR 05/06/2023   Nonrheumatic aortic valve stenosis    Encounter for screening mammogram for high-risk patient 02/02/2015   Low bone density 02/02/2015   Nevus of back 02/02/2015   Breast cancer (HCC) 05/16/2011      Discharge Diagnoses:  Patient Active Problem List   Diagnosis Date Noted   S/P CABG x 1 05/06/2023   S/P AVR 05/06/2023   Nonrheumatic aortic valve stenosis    Encounter for screening mammogram for high-risk patient 02/02/2015   Low bone density 02/02/2015   Nevus of back 02/02/2015   Breast cancer (HCC) 05/16/2011      S/P CABG x 1   Discharged Condition: stable  History of Present Illness:  Nebula Porten is a 67 yo female with a hx of breast cancer, hyperlipidemia who presents for follow-up. She was referred by Dr. Tracie Harrier for evaluation of heart murmur, initially seen on 10/24/2019. She reports that she followed at [facility] as a child, was told she had an issue with her aortic valve. She had a heart catheterization done in middle school. States that she followed with cardiology there until college, has not been seen for 40 years. Reports that she was told that everything resolved and she did not need further cardiology follow-up. She denies any chest pain or dyspnea. States that she walks about twice per week, usually from 45 minutes to an hour. Prior to pandemic was walking for 1 to 2 hours. She denies any lightheadedness, syncope, or palpitations. Does report occasional lower extremity edema. No history of smoking. Family history includes mother died of MI at age 53, and brother had CABG/AVR and died at age 24.  Her most recent echocardiogram showed progression of her aortic disease.  Catheterization  was also performed and showed single vessel CAD.  She was referred to [facility] for surgical evaluation.  It was felt she would require AVR/CABG.  The risks and benefits of the procedure were explained to the patient and she was agreeable to proceed.    Hospital Course:   Jacia Wingert presented to [facility] on 05/06/2023.  She was taken to the operating room and underwent CABG x 1 utilizing LIMA to LAD and Aortic Valve Replacement with a 21 mm Edwards Inspiris Resilia Valve.  She tolerated the procedure without difficulty and was taken to the SICU in stable condition.  Vital signs and hemodynamics remained stable.  She was extubated routinely on the afternoon of the day of surgery.  On the first postoperative day the monitoring lines were removed.  Diuresis was begun for expected volume excess.  She was mobilized.  Her chest tubes and pacing wires were removed without difficulty.  She was maintaining NSR and paced by her PPM. She was felt stable for transfer to the progressive care unit on 05/08/2023 but was not able to get a bed until 10/19. She began ambulating well on RA. Her H/H was trending down to 7.3/22.5. She was asymptomatic, iron supplement was started. H and H was increased on 10/21 to 7.9 and 24.6. She will be continued on oral ferrous sulfate at discharge. She is maintaining sinus rhythm. Amiodarone (for a fib prophylaxis) will be decreased to 200 mg bid then daily at

## 2023-05-08 ENCOUNTER — Inpatient Hospital Stay (HOSPITAL_COMMUNITY): Payer: Medicare Other

## 2023-05-08 ENCOUNTER — Other Ambulatory Visit: Payer: Self-pay | Admitting: Physician Assistant

## 2023-05-08 ENCOUNTER — Other Ambulatory Visit (HOSPITAL_COMMUNITY): Payer: Self-pay

## 2023-05-08 DIAGNOSIS — D62 Acute posthemorrhagic anemia: Secondary | ICD-10-CM | POA: Diagnosis not present

## 2023-05-08 DIAGNOSIS — Z952 Presence of prosthetic heart valve: Secondary | ICD-10-CM | POA: Diagnosis not present

## 2023-05-08 DIAGNOSIS — I350 Nonrheumatic aortic (valve) stenosis: Secondary | ICD-10-CM

## 2023-05-08 DIAGNOSIS — R739 Hyperglycemia, unspecified: Secondary | ICD-10-CM | POA: Diagnosis not present

## 2023-05-08 LAB — BASIC METABOLIC PANEL
Anion gap: 9 (ref 5–15)
BUN: 9 mg/dL (ref 8–23)
CO2: 26 mmol/L (ref 22–32)
Calcium: 8.3 mg/dL — ABNORMAL LOW (ref 8.9–10.3)
Chloride: 99 mmol/L (ref 98–111)
Creatinine, Ser: 0.73 mg/dL (ref 0.44–1.00)
GFR, Estimated: >60 mL/min (ref >60)
Glucose, Bld: 120 mg/dL — ABNORMAL HIGH (ref 70–99)
Potassium: 3.6 mmol/L (ref 3.5–5.1)
Sodium: 134 mmol/L — ABNORMAL LOW (ref 135–145)

## 2023-05-08 LAB — LIPID PANEL
Cholesterol: 60 mg/dL (ref 0–200)
HDL: 27 mg/dL — ABNORMAL LOW (ref >40)
LDL Cholesterol: 15 mg/dL (ref 0–99)
Total CHOL/HDL Ratio: 2.2 {ratio}
Triglycerides: 92 mg/dL (ref <150)
VLDL: 18 mg/dL (ref 0–40)

## 2023-05-08 LAB — GLUCOSE, CAPILLARY
Glucose-Capillary: 131 mg/dL — ABNORMAL HIGH (ref 70–99)
Glucose-Capillary: 155 mg/dL — ABNORMAL HIGH (ref 70–99)
Glucose-Capillary: 122 mg/dL — ABNORMAL HIGH (ref 70–99)
Glucose-Capillary: 118 mg/dL — ABNORMAL HIGH (ref 70–99)
Glucose-Capillary: 116 mg/dL — ABNORMAL HIGH (ref 70–99)
Glucose-Capillary: 122 mg/dL — ABNORMAL HIGH (ref 70–99)

## 2023-05-08 LAB — CBC
HCT: 25.2 % — ABNORMAL LOW (ref 36.0–46.0)
Hemoglobin: 8 g/dL — ABNORMAL LOW (ref 12.0–15.0)
MCH: 27.9 pg (ref 26.0–34.0)
MCHC: 31.7 g/dL (ref 30.0–36.0)
MCV: 87.8 fL (ref 80.0–100.0)
Platelets: 134 10*3/uL — ABNORMAL LOW (ref 150–400)
RBC: 2.87 MIL/uL — ABNORMAL LOW (ref 3.87–5.11)
RDW: 13.9 % (ref 11.5–15.5)
WBC: 7.9 10*3/uL (ref 4.0–10.5)
nRBC: 0 % (ref 0.0–0.2)

## 2023-05-08 MED ORDER — ENOXAPARIN SODIUM 40 MG/0.4ML IJ SOSY: 40.0000 mg | PREFILLED_SYRINGE | Freq: Every day | INTRAMUSCULAR | Status: DC

## 2023-05-08 MED ORDER — FUROSEMIDE 10 MG/ML IJ SOLN
40.0000 mg | Freq: Once | INTRAMUSCULAR | Status: AC
  Administered 2023-05-08: 40 mg via INTRAVENOUS
  Filled 2023-05-08: qty 4

## 2023-05-08 MED ORDER — FUROSEMIDE 10 MG/ML IJ SOLN: 40.0000 mg | Freq: Once | INTRAMUSCULAR | Status: DC

## 2023-05-08 MED ORDER — INSULIN ASPART 100 UNIT/ML IJ SOLN: 0.0000 [IU] | INTRAMUSCULAR | Status: DC

## 2023-05-08 NOTE — Progress Notes (Signed)
[address]       [place] [address]             [phone]      2 Days Post-Op  Procedure(s) (LRB): CORONARY ARTERY BYPASS GRAFTING (CABG) X ONE USING LEFT INTERNAL MAMMARY ARTERY (N/A) AORTIC VALVE REPLACEMENT (AVR) USING 21 MM INSPIRIS RESILIA AORTIC VALVE (N/A) TRANSESOPHAGEAL ECHOCARDIOGRAM (N/A)   Total Length of Stay:  LOS: 2 days    SUBJECTIVE: No issues  Vitals:   05/08/23 0500 05/08/23 0600  BP: (!) 149/58 130/64  Pulse: 78 84  Resp: 11 17  Temp:    SpO2: 96% 96%    Intake/Output      10/17 0701 10/18 0700 10/18 0701 10/19 0700   P.O.     I.V. (mL/kg) 234 (3)    Blood     NG/GT     IV Piggyback 200.2    Total Intake(mL/kg) 434.2 (5.5)    Urine (mL/kg/hr) 1070 (0.6)    Chest Tube 340    Total Output 1410    Net -975.8             albumin human Stopped (05/06/23 1410)    CBC    Component Value Date/Time   WBC 7.9 05/08/2023 0500   RBC 2.87 (L) 05/08/2023 0500   HGB 8.0 (L) 05/08/2023 0500   HGB 13.4 04/07/2023 1056   HGB 12.6 02/01/2015 1430   HCT 25.2 (L) 05/08/2023 0500   HCT 41.4 04/07/2023 1056   HCT 37.9 02/01/2015 1430   PLT 134 (L) 05/08/2023 0500   PLT 341 04/07/2023 1056   MCV 87.8 05/08/2023 0500   MCV 87 04/07/2023 1056   MCV 83.2 02/01/2015 1430   MCH 27.9 05/08/2023 0500   MCHC 31.7 05/08/2023 0500   RDW 13.9 05/08/2023 0500   RDW 13.6 04/07/2023 1056   RDW 15.1 (H) 02/01/2015 1430   LYMPHSABS 2.0 04/07/2023 1056   LYMPHSABS 1.3 02/01/2015 1430   MONOABS 0.4 02/01/2015 1430   EOSABS 0.1 04/07/2023 1056   BASOSABS 0.1 04/07/2023 1056   BASOSABS 0.0 02/01/2015 1430   CMP     Component Value Date/Time   NA 134 (L) 05/08/2023 0500   NA 138 04/07/2023 1056   NA 143 02/01/2015 1430   K 3.6 05/08/2023 0500   K 3.7 02/01/2015 1430   CL 99 05/08/2023 0500   CO2 26 05/08/2023 0500   CO2 28 02/01/2015 1430   GLUCOSE 120 (H) 05/08/2023 0500   GLUCOSE 126 02/01/2015 1430   BUN 9 05/08/2023  0500   BUN 22 04/07/2023 1056   BUN 9.9 02/01/2015 1430   CREATININE 0.73 05/08/2023 0500   CREATININE 0.8 02/01/2015 1430   CALCIUM 8.3 (L) 05/08/2023 0500   CALCIUM 9.6 02/01/2015 1430   PROT 8.2 (H) 05/04/2023 1057   PROT 7.4 02/01/2015 1430   ALBUMIN 4.6 05/04/2023 1057   ALBUMIN 4.0 02/01/2015 1430   AST 41 05/04/2023 1057   AST 28 02/01/2015 1430   ALT 48 (H) 05/04/2023 1057   ALT 48 02/01/2015 1430   ALKPHOS 57 05/04/2023 1057   ALKPHOS 107 02/01/2015 1430   BILITOT 1.1 05/04/2023 1057   BILITOT 0.32 02/01/2015 1430   GFRNONAA >60 05/08/2023 0500   ABG    Component Value Date/Time   PHART 7.365 05/06/2023 1633   PCO2ART 44.7 05/06/2023 1633   PO2ART 120 (H) 05/06/2023 1633   HCO3 25.5 05/06/2023 1633   TCO2 27 05/06/2023  1633   ACIDBASEDEF 7.0 (H) 05/06/2023 1524   O2SAT 98 05/06/2023 1633   CBG (last 3)  Recent Labs    05/07/23 2056 05/07/23 2358 05/08/23 0425  GLUCAP 132* 154* 131*  EXAM Lungs: clear Card:RR Ext: Warm Neuro: alert   ASSESSMENT POD #1 sp AVR CABx1 Doing well Transfer to floor DC Pws and chest tubes Diuresis   Eugenio Hoes, MD 05/08/2023

## 2023-05-08 NOTE — Discharge Instructions (Signed)

## 2023-05-08 NOTE — TOC Benefit Eligibility Note (Signed)
Patient Product/process development scientist completed.    The patient is insured through [facility]. Patient has Medicare and is not eligible for a copay card, but may be able to apply for patient assistance, if available.    Ran test claim for Farxiga 10 mg and the current 30 day co-pay is $45.00.  Ran test claim for Jardiance 10 mg and the current 30 day co-pay is $45.00.  This test claim was processed through [facility]- copay amounts may vary at other pharmacies due to pharmacy/plan contracts, or as the patient moves through the different stages of their insurance plan.     Roland Earl, CPHT Pharmacy Technician III Certified Patient Advocate [facility] Patient Advocate Team Direct Number: [phone]  Fax: [phone]

## 2023-05-08 NOTE — Progress Notes (Signed)
      [address]       [place] [address]             [phone]      POD # 2 AVR CABG x 1  Ambulating  BP (!) 97/59 (BP Location: Left Arm)   Pulse 83   Temp 98.1 F (36.7 C) (Oral)   Resp 17   Ht 5' 3.5" (1.613 m)   Wt 78.6 kg   SpO2 100%   BMI 30.21 kg/m  2L [place] 97% sat   Intake/Output Summary (Last 24 hours) at 05/08/2023 1643 Last data filed at 05/08/2023 1400 Gross per 24 hour  Intake 509.14 ml  Output 3125 ml  Net -2615.86 ml   Diuresing well with single dose of Lasix  Viviann Spare C. Dorris Fetch, MD [facility] [phone]

## 2023-05-08 NOTE — Progress Notes (Deleted)
[address]       [place] [address]             [phone]      2 Days Post-Op  Procedure(s) (LRB): CORONARY ARTERY BYPASS GRAFTING (CABG) X ONE USING LEFT INTERNAL MAMMARY ARTERY (N/A) AORTIC VALVE REPLACEMENT (AVR) USING 21 MM INSPIRIS RESILIA AORTIC VALVE (N/A) TRANSESOPHAGEAL ECHOCARDIOGRAM (N/A)   Total Length of Stay:  LOS: 2 days    SUBJECTIVE: No issues overnight  Vitals:   05/08/23 0500 05/08/23 0600  BP: (!) 149/58 130/64  Pulse: 78 84  Resp: 11 17  Temp:    SpO2: 96% 96%    Intake/Output      10/17 0701 10/18 0700 10/18 0701 10/19 0700   P.O.     I.V. (mL/kg) 234 (3)    Blood     NG/GT     IV Piggyback 200.2    Total Intake(mL/kg) 434.2 (5.5)    Urine (mL/kg/hr) 1070 (0.6)    Chest Tube 340    Total Output 1410    Net -975.8             albumin human Stopped (05/06/23 1410)    CBC    Component Value Date/Time   WBC 7.9 05/08/2023 0500   RBC 2.87 (L) 05/08/2023 0500   HGB 8.0 (L) 05/08/2023 0500   HGB 13.4 04/07/2023 1056   HGB 12.6 02/01/2015 1430   HCT 25.2 (L) 05/08/2023 0500   HCT 41.4 04/07/2023 1056   HCT 37.9 02/01/2015 1430   PLT 134 (L) 05/08/2023 0500   PLT 341 04/07/2023 1056   MCV 87.8 05/08/2023 0500   MCV 87 04/07/2023 1056   MCV 83.2 02/01/2015 1430   MCH 27.9 05/08/2023 0500   MCHC 31.7 05/08/2023 0500   RDW 13.9 05/08/2023 0500   RDW 13.6 04/07/2023 1056   RDW 15.1 (H) 02/01/2015 1430   LYMPHSABS 2.0 04/07/2023 1056   LYMPHSABS 1.3 02/01/2015 1430   MONOABS 0.4 02/01/2015 1430   EOSABS 0.1 04/07/2023 1056   BASOSABS 0.1 04/07/2023 1056   BASOSABS 0.0 02/01/2015 1430   CMP     Component Value Date/Time   NA 134 (L) 05/08/2023 0500   NA 138 04/07/2023 1056   NA 143 02/01/2015 1430   K 3.6 05/08/2023 0500   K 3.7 02/01/2015 1430   CL 99 05/08/2023 0500   CO2 26 05/08/2023 0500   CO2 28 02/01/2015 1430   GLUCOSE 120 (H) 05/08/2023 0500   GLUCOSE 126 02/01/2015 1430   BUN 9  05/08/2023 0500   BUN 22 04/07/2023 1056   BUN 9.9 02/01/2015 1430   CREATININE 0.73 05/08/2023 0500   CREATININE 0.8 02/01/2015 1430   CALCIUM 8.3 (L) 05/08/2023 0500   CALCIUM 9.6 02/01/2015 1430   PROT 8.2 (H) 05/04/2023 1057   PROT 7.4 02/01/2015 1430   ALBUMIN 4.6 05/04/2023 1057   ALBUMIN 4.0 02/01/2015 1430   AST 41 05/04/2023 1057   AST 28 02/01/2015 1430   ALT 48 (H) 05/04/2023 1057   ALT 48 02/01/2015 1430   ALKPHOS 57 05/04/2023 1057   ALKPHOS 107 02/01/2015 1430   BILITOT 1.1 05/04/2023 1057   BILITOT 0.32 02/01/2015 1430   GFRNONAA >60 05/08/2023 0500   ABG    Component Value Date/Time   PHART 7.365 05/06/2023 1633   PCO2ART 44.7 05/06/2023 1633   PO2ART 120 (H) 05/06/2023 1633   HCO3 25.5 05/06/2023 1633   TCO2 27  05/06/2023 1633   ACIDBASEDEF 7.0 (H) 05/06/2023 1524   O2SAT 98 05/06/2023 1633   CBG (last 3)  Recent Labs    05/07/23 2056 05/07/23 2358 05/08/23 0425  GLUCAP 132* 154* 131*  EXAM Lungs: clear Card:RR Ext warm Neuro: awake   ASSESSMENT: POD #1 SP AVR CABGx2 Hemodynamics ok, dc swan and art line Start amio prophylaxis Lasix Leave CT for today    Eugenio Hoes, MD 05/08/2023

## 2023-05-08 NOTE — Progress Notes (Signed)
NAME:  [name], MRN:  [id number], DOB:  06-26-1956, LOS: 2 ADMISSION DATE:  05/06/2023, CONSULTATION DATE:  10/16 REFERRING MD:  Dr. Leafy Ro, CHIEF COMPLAINT:  CABG x1; aortic valve stenosis s/p AVR  History of Present Illness:  Patient is a 67 yo F w/ pertinent PMH aortic valve stenosis w/ bicuspid valve, high grade degree AV block w/ PPM, CAD, HTN, HLD presents to [facility] on 10/16 for elective AVR.  Patient been followed by cardiology. Echo showing AS. Had heart block a few months ago requiring PPM. Having worsening fatigue and DOE. TCTS following and plan for AVR.  10/16 admitted for elective AVR. Post op intubated/sedated and transferred to [facility] ICU. PCCM consulted.  Pertinent  Medical History   Past Medical History:  Diagnosis Date   Anxiety    Aortic valve stenosis    Arthritis    Breast cancer (HCC) 2004   right breast; ER/PR+, Her2+   Cancer (HCC) 10/2002   right breast lumpectomy   Coronary artery disease    Dysrhythmia    2nd Degree AV block   GERD (gastroesophageal reflux disease)    Heart murmur    History of kidney stones    HX   HTN (hypertension)    Hyperlipidemia    Morton neuroma    Personal history of chemotherapy 11/19/2002   Personal history of radiation therapy 02/19/2003   Pneumonia    Pre-diabetes    Presence of permanent cardiac pacemaker 01/21/2023   [employer]     Significant Hospital Events: Including procedures, antibiotic start and stop dates in addition to other pertinent events   10/16 AVR and CABGx1  Interim History / Subjective:  Pain fairly controlled, not eating much. No BM since surgery. Afebrile.  Objective   Blood pressure 130/64, pulse 84, temperature 98.2 F (36.8 C), temperature source Oral, resp. rate 17, height 5' 3.5" (1.613 m), weight 78.6 kg, SpO2 96%.        Intake/Output Summary (Last 24 hours) at 05/08/2023 0734 Last data filed at 05/08/2023 0600 Gross per 24 hour  Intake 434.18 ml  Output 1410 ml  Net  -975.82 ml   Filed Weights   05/06/23 0635 05/07/23 0631 05/08/23 0500  Weight: 73 kg 78.4 kg 78.6 kg    Examination: General: elderly woman lying in bed in NAD, appears younger than stated age HEENT: [place]/AT, eyes anicteric Neuro: awake, alert, moving all extremities CV: S1S2, RRR PULM: breathing comfortably on [place], CTAB, minimal chest tube output.  GI: soft, NT Extremities: no cyanosis, mild hand edema Skin: warm, dry  BUN 9 Cr 0.73 WBC 7.9 H/H 8/25.2 Platelets 134  Chest tube output 340cc  CXR personally reviewed> basilar atelectasis. Chest tubes, PPM, RIJ introducer in place.   Resolved Hospital Problem list   Mild consumptive coagulopathy  Assessment & Plan:   Severe AS, bicuspid AV s/p AVR 1-vessel CAD, CABG x 1; baseline EF 55-60% -post-op care per TCTS; able to transfer to progressive care today -con't metoprolol; dose adjust as BP allows -aspirin, statin  -con't amiodarone -progressive mobility -pain control per protocol- morphine, oxy, tramadol -pulmonary hygiene -lasix  Hyperglycemia; h/o prediabetes. A1c 6.7 -goal BG <180, SSI PRN   Mild hypervolemic hyponatremia -avoid hyptertonic fluids -lasix  ABLA- expected post-op Consumptive thrombocytopenia- expected post-op -no current indication for transfusion   Prolonged Qtc -con't to avoid Qtc prolonging meds   H/o HTN; controlled currently -hold PTA losartan, titrate up metoprolol as tolerated  PCCM will be available as needed, please  call with questions.   Best Practice (right click and "Reselect all SmartList Selections" daily)   Diet/type: advance diet as tolerated DVT prophylaxis: lovenox GI prophylaxis: PPI Lines: Central line  Foley:  Yes, and it is still needed Code Status:  full code Last date of multidisciplinary goals of care discussion [per primary]  Labs   CBC: Recent Labs  Lab 05/06/23 1855 05/07/23 0430 05/07/23 0921 05/07/23 1805 05/08/23 0500  WBC 8.3 7.8 7.8 9.6  7.9  HGB 8.2* 8.3* 8.3* 8.2* 8.0*  HCT 24.8* 25.2* 25.7* 25.6* 25.2*  MCV 87.3 85.1 88.3 85.6 87.8  PLT 136* 144* 140* 133* 134*    Basic Metabolic Panel: Recent Labs  Lab 05/04/23 1057 05/06/23 1003 05/06/23 1155 05/06/23 1325 05/06/23 1633 05/06/23 1855 05/07/23 0430 05/07/23 0921 05/07/23 1805 05/08/23 0500  NA 137   < > 136   < > 140 136 135  --  134* 134*  K 4.5   < > 4.8   < > 4.3 4.2 4.1  --  3.8 3.6  CL 103   < > 103  --   --  104 103  --  98 99  CO2 23  --   --   --   --  24 23  --  25 26  GLUCOSE 113*   < > 173*  --   --  148* 137*  --  149* 120*  BUN 19   < > 11  --   --  10 11  --  10 9  CREATININE 1.13*   < > 0.80  --   --  0.99 0.99 1.02* 0.90 0.73  CALCIUM 10.4*  --   --   --   --  7.7* 7.8*  --  8.1* 8.3*  MG  --   --   --   --   --  3.2* 2.2  --  2.0  --    < > = values in this interval not displayed.   GFR: Estimated Creatinine Clearance: 68.5 mL/min (by C-G formula based on SCr of 0.73 mg/dL). Recent Labs  Lab 05/07/23 0430 05/07/23 0921 05/07/23 1805 05/08/23 0500  WBC 7.8 7.8 9.6 7.9    Liver Function Tests: Recent Labs  Lab 05/04/23 1057  AST 41  ALT 48*  ALKPHOS 57  BILITOT 1.1  PROT 8.2*  ALBUMIN 4.6     Critical care time:       Steffanie Dunn, DO 05/08/23 8:33 AM [facility]  For contact information, see Amion. If no response to pager, please call PCCM consult pager. After hours, 7PM- 7AM, please call Elink.

## 2023-05-08 NOTE — Progress Notes (Signed)
Remote pacemaker transmission.   

## 2023-05-08 NOTE — Progress Notes (Signed)
Echo ordered post AVR

## 2023-05-08 NOTE — Progress Notes (Signed)
CARDIAC REHAB PHASE I   PRE:  Rate/Rhythm: 80 pacing    BP: sitting 110/64    SpO2: 97 2L  MODE:  Ambulation: 290 ft   POST:  Rate/Rhythm: 105 ST    BP: sitting 115/64     SpO2: 100 2L  Pt moved out of bed well and walked with RW and 2L O2, standby assist. Brief rest due to SOB. To recliner after walk. Practiced IS, only able to inspire 100 ml. Pt working on it when I left  Discussed walking x2 more, IS, and CRPII. Will refer to [facility].  [phone]   [name] BS, ACSM-CEP 05/08/2023 2:44 PM

## 2023-05-08 NOTE — Plan of Care (Signed)
CHL Tonsillectomy/Adenoidectomy, Postoperative PEDS care plan entered in error.

## 2023-05-09 ENCOUNTER — Inpatient Hospital Stay (HOSPITAL_COMMUNITY): Payer: Medicare Other

## 2023-05-09 LAB — CBC
HCT: 22.8 % — ABNORMAL LOW (ref 36.0–46.0)
Hemoglobin: 7.5 g/dL — ABNORMAL LOW (ref 12.0–15.0)
MCH: 27.7 pg (ref 26.0–34.0)
MCHC: 32.9 g/dL (ref 30.0–36.0)
MCV: 84.1 fL (ref 80.0–100.0)
Platelets: 146 10*3/uL — ABNORMAL LOW (ref 150–400)
RBC: 2.71 MIL/uL — ABNORMAL LOW (ref 3.87–5.11)
RDW: 13.7 % (ref 11.5–15.5)
WBC: 7.1 10*3/uL (ref 4.0–10.5)
nRBC: 0 % (ref 0.0–0.2)

## 2023-05-09 LAB — BASIC METABOLIC PANEL
Anion gap: 8 (ref 5–15)
BUN: 12 mg/dL (ref 8–23)
CO2: 27 mmol/L (ref 22–32)
Calcium: 7.7 mg/dL — ABNORMAL LOW (ref 8.9–10.3)
Chloride: 100 mmol/L (ref 98–111)
Creatinine, Ser: 0.93 mg/dL (ref 0.44–1.00)
GFR, Estimated: >60 mL/min (ref >60)
Glucose, Bld: 115 mg/dL — ABNORMAL HIGH (ref 70–99)
Potassium: 3.5 mmol/L (ref 3.5–5.1)
Sodium: 135 mmol/L (ref 135–145)

## 2023-05-09 LAB — GLUCOSE, CAPILLARY
Glucose-Capillary: 99 mg/dL (ref 70–99)
Glucose-Capillary: 136 mg/dL — ABNORMAL HIGH (ref 70–99)
Glucose-Capillary: 93 mg/dL (ref 70–99)
Glucose-Capillary: 115 mg/dL — ABNORMAL HIGH (ref 70–99)
Glucose-Capillary: 125 mg/dL — ABNORMAL HIGH (ref 70–99)

## 2023-05-09 MED ORDER — POTASSIUM CHLORIDE CRYS ER 20 MEQ PO TBCR
20.0000 meq | EXTENDED_RELEASE_TABLET | ORAL | Status: AC
  Administered 2023-05-09 (×2): 20 meq via ORAL
  Filled 2023-05-09 (×2): qty 1

## 2023-05-09 MED ORDER — INSULIN ASPART 100 UNIT/ML IJ SOLN: 0.0000 [IU] | Freq: Three times a day (TID) | INTRAMUSCULAR | Status: DC

## 2023-05-09 MED ORDER — POTASSIUM CHLORIDE CRYS ER 20 MEQ PO TBCR
20.0000 meq | EXTENDED_RELEASE_TABLET | Freq: Once | ORAL | Status: AC
  Administered 2023-05-09: 20 meq via ORAL
  Filled 2023-05-09: qty 1

## 2023-05-09 NOTE — Progress Notes (Signed)
3 Days Post-Op Procedure(s) (LRB): CORONARY ARTERY BYPASS GRAFTING (CABG) X ONE USING LEFT INTERNAL MAMMARY ARTERY (N/A) AORTIC VALVE REPLACEMENT (AVR) USING 21 MM INSPIRIS RESILIA AORTIC VALVE (N/A) TRANSESOPHAGEAL ECHOCARDIOGRAM (N/A) Subjective: Feels well this AM  Objective: Vital signs in last 24 hours: Temp:  [98 F (36.7 C)-98.8 F (37.1 C)] 98.2 F (36.8 C) (10/19 0313) Pulse Rate:  [78-104] 80 (10/19 0825) Cardiac Rhythm: Atrial paced;Ventricular paced (10/19 0803) Resp:  [9-23] 16 (10/19 0825) BP: (88-120)/(55-75) 92/61 (10/19 0800) SpO2:  [89 %-100 %] 98 % (10/19 0825) Weight:  [76.2 kg] 76.2 kg (10/19 0701)  Hemodynamic parameters for last 24 hours:    Intake/Output from previous day: 10/18 0701 - 10/19 0700 In: 860 [P.O.:660; IV Piggyback:200] Out: 2300 [Urine:2300] Intake/Output this shift: No intake/output data recorded.  General appearance: alert, cooperative, and no distress Neurologic: intact Heart: regular rate and rhythm Lungs: diminished breath sounds bibasilar Wound: clean and dry  Lab Results: Recent Labs    05/08/23 0500 05/09/23 0354  WBC 7.9 7.1  HGB 8.0* 7.5*  HCT 25.2* 22.8*  PLT 134* 146*   BMET:  Recent Labs    05/08/23 0500 05/09/23 0354  NA 134* 135  K 3.6 3.5  CL 99 100  CO2 26 27  GLUCOSE 120* 115*  BUN 9 12  CREATININE 0.73 0.93  CALCIUM 8.3* 7.7*    PT/INR:  Recent Labs    05/06/23 1327  LABPROT 19.7*  INR 1.6*   ABG    Component Value Date/Time   PHART 7.365 05/06/2023 1633   HCO3 25.5 05/06/2023 1633   TCO2 27 05/06/2023 1633   ACIDBASEDEF 7.0 (H) 05/06/2023 1524   O2SAT 98 05/06/2023 1633   CBG (last 3)  Recent Labs    05/08/23 2305 05/09/23 0304 05/09/23 0731  GLUCAP 122* 99 136*    Assessment/Plan: S/P Procedure(s) (LRB): CORONARY ARTERY BYPASS GRAFTING (CABG) X ONE USING LEFT INTERNAL MAMMARY ARTERY (N/A) AORTIC VALVE REPLACEMENT (AVR) USING 21 MM INSPIRIS RESILIA AORTIC VALVE  (N/A) TRANSESOPHAGEAL ECHOCARDIOGRAM (N/A) POD # 3 NEURO- intact CV- paced with PPM  On ASA, amiodarone, lopressor, rosuvastatin  Temp pacing wires removed yesterday RESP- continue IS RENAL- BUN, creatinine Ok'  Supplement K ENDO- CBG well controlled  Change SSI to [facility] and HS Gi - tolerating diet Anemia- Hgb 7.5 monitor SCD + Enoxaparin Cardiac rehab To 4E today  LOS: 3 days    Loreli Slot 05/09/2023

## 2023-05-09 NOTE — Progress Notes (Signed)
Pt arrived at the unit,CHG bath given,Vitals taken,CCMD notified,pt oriented to the unit,no complaints of pain

## 2023-05-09 NOTE — Progress Notes (Addendum)
CARDIAC REHAB PHASE I   PRE:  Rate/Rhythm: 80 paced  BP:  Supine: 108/79   SaO2: 100 RA  MODE:  Ambulation: 300 ft   POST:  Rate/Rhythm: 80 paced  BP:  Supine: 125/67   SaO2: 100 RA  Pt resting in bed, to standing following sternal precautions well without assistance. Pt amb in hallway well, went first half with standby assist and second half using RW due to fatigue. Tolerated well without s/sx. Returned to bed, educated on sternal precautions, IS, exercise guidelines, restrictions, nutrition, and CRP2 referral. Pt requested referral to be changed from [place] to [place] due to eagerness to return to water aerobics. Referral changed. Pt voiced understanding, all questions answered. Left in bed with call bell. [phone]   Jonna Coup, MS, ACSM-CEP 05/09/2023 10:46 AM

## 2023-05-10 LAB — GLUCOSE, CAPILLARY
Glucose-Capillary: 92 mg/dL (ref 70–99)
Glucose-Capillary: 87 mg/dL (ref 70–99)
Glucose-Capillary: 123 mg/dL — ABNORMAL HIGH (ref 70–99)

## 2023-05-10 LAB — CBC
HCT: 22.5 % — ABNORMAL LOW (ref 36.0–46.0)
Hemoglobin: 7.3 g/dL — ABNORMAL LOW (ref 12.0–15.0)
MCH: 27.4 pg (ref 26.0–34.0)
MCHC: 32.4 g/dL (ref 30.0–36.0)
MCV: 84.6 fL (ref 80.0–100.0)
Platelets: 197 10*3/uL (ref 150–400)
RBC: 2.66 MIL/uL — ABNORMAL LOW (ref 3.87–5.11)
RDW: 13.7 % (ref 11.5–15.5)
WBC: 6.4 10*3/uL (ref 4.0–10.5)
nRBC: 0.6 % — ABNORMAL HIGH (ref 0.0–0.2)

## 2023-05-10 MED ORDER — POTASSIUM CHLORIDE CRYS ER 20 MEQ PO TBCR
40.0000 meq | EXTENDED_RELEASE_TABLET | Freq: Two times a day (BID) | ORAL | Status: AC
  Administered 2023-05-10 (×2): 40 meq via ORAL
  Filled 2023-05-10 (×2): qty 2

## 2023-05-10 MED ORDER — POTASSIUM CHLORIDE CRYS ER 20 MEQ PO TBCR
20.0000 meq | EXTENDED_RELEASE_TABLET | Freq: Every day | ORAL | Status: DC
  Administered 2023-05-11: 20 meq via ORAL
  Filled 2023-05-10: qty 1

## 2023-05-10 MED ORDER — FE FUM-VIT C-VIT B12-FA 460-60-0.01-1 MG PO CAPS
1.0000 | ORAL_CAPSULE | Freq: Every day | ORAL | Status: DC
  Administered 2023-05-10 – 2023-05-11 (×2): 1 via ORAL
  Filled 2023-05-10 (×2): qty 1

## 2023-05-10 MED ORDER — FUROSEMIDE 40 MG PO TABS
40.0000 mg | ORAL_TABLET | Freq: Every day | ORAL | Status: DC
  Administered 2023-05-10 – 2023-05-11 (×2): 40 mg via ORAL
  Filled 2023-05-10 (×2): qty 1

## 2023-05-10 NOTE — Plan of Care (Signed)
  Problem: Education: Goal: Understanding of CV disease, CV risk reduction, and recovery process will improve Outcome: Progressing Goal: Individualized Educational Video(s) Outcome: Progressing   Problem: Activity: Goal: Ability to return to baseline activity level will improve Outcome: Progressing   Problem: Health Behavior/Discharge Planning: Goal: Ability to safely manage health-related needs after discharge will improve Outcome: Progressing   Problem: Education: Goal: Knowledge of cardiac device and self-care will improve Outcome: Progressing Goal: Ability to safely manage health related needs after discharge will improve Outcome: Progressing Goal: Individualized Educational Video(s) Outcome: Progressing

## 2023-05-10 NOTE — Progress Notes (Addendum)
      [address]       [place] [address]             [phone]      4 Days Post-Op Procedure(s) (LRB): CORONARY ARTERY BYPASS GRAFTING (CABG) X ONE USING LEFT INTERNAL MAMMARY ARTERY (N/A) AORTIC VALVE REPLACEMENT (AVR) USING 21 MM INSPIRIS RESILIA AORTIC VALVE (N/A) TRANSESOPHAGEAL ECHOCARDIOGRAM (N/A) Subjective: Patient has no complaints this AM. Sitting up in chair. States she walked around 2AM  Objective: Vital signs in last 24 hours: Temp:  [97.5 F (36.4 C)-98.5 F (36.9 C)] 98.5 F (36.9 C) (10/20 0500) Pulse Rate:  [80-83] 83 (10/19 1941) Cardiac Rhythm: A-V Sequential paced (10/20 0042) Resp:  [15-19] 15 (10/20 0500) BP: (98-139)/(63-79) 111/74 (10/20 0500) SpO2:  [92 %-98 %] 98 % (10/19 1941) Weight:  [75.8 kg] 75.8 kg (10/20 0603)  Hemodynamic parameters for last 24 hours:    Intake/Output from previous day: 10/19 0701 - 10/20 0700 In: 240 [P.O.:240] Out: -  Intake/Output this shift: No intake/output data recorded.  General appearance: alert, cooperative, and no distress Neurologic: intact Heart: regular rate and rhythm, paced, no murmur, AV click Lungs: clear to auscultation bilaterally Abdomen: soft, non-tender; bowel sounds normal; no masses,  no organomegaly Extremities: edema 1+ Wound: Clean and dry without sign of infection  Lab Results: Recent Labs    05/09/23 0354 05/10/23 0307  WBC 7.1 6.4  HGB 7.5* 7.3*  HCT 22.8* 22.5*  PLT 146* 197   BMET:  Recent Labs    05/08/23 0500 05/09/23 0354  NA 134* 135  K 3.6 3.5  CL 99 100  CO2 26 27  GLUCOSE 120* 115*  BUN 9 12  CREATININE 0.73 0.93  CALCIUM 8.3* 7.7*    PT/INR: No results for input(s): "LABPROT", "INR" in the last 72 hours. ABG    Component Value Date/Time   PHART 7.365 05/06/2023 1633   HCO3 25.5 05/06/2023 1633   TCO2 27 05/06/2023 1633   ACIDBASEDEF 7.0 (H) 05/06/2023 1524   O2SAT 98 05/06/2023 1633   CBG (last 3)  Recent Labs     05/09/23 1712 05/09/23 2119 05/10/23 0601  GLUCAP 115* 125* 92    Assessment/Plan: S/P Procedure(s) (LRB): CORONARY ARTERY BYPASS GRAFTING (CABG) X ONE USING LEFT INTERNAL MAMMARY ARTERY (N/A) AORTIC VALVE REPLACEMENT (AVR) USING 21 MM INSPIRIS RESILIA AORTIC VALVE (N/A) TRANSESOPHAGEAL ECHOCARDIOGRAM (N/A)  CV: NSR, HR 80 paced by PPM. BP 92-139 on Lopressor 12.5mg  BID. On PO Amiodarone 400mg  BID.   Pulm: Saturating well on RA. CXR with right infrahilar atelectasis. Encourage IS and ambulation.   GI: +BM 10/19, tolerating a diet  Endo: Preop A1C 5.7. Pt likely has prediabetes. CBGs controlled. Will d/c CBGs and SSI.   Renal: Stable Cr. +7lbs, will give Lasix. Last K 3.5, will supplement for goal>4.  Expected postop ABLA: H/H 7.3/22.5, nearing transfusion threshold but asymptomatic. Will discuss possible transfusion with Dr. Dorris Fetch.   DVT Prophylaxis: Lovenox  Dispo: Possible d/c home tomorrow AM   LOS: 4 days    [name], PA-C 05/10/2023 Looks great Plan to [employer] C. Dorris Fetch, MD [facility] [phone]

## 2023-05-11 LAB — CBC
HCT: 24.6 % — ABNORMAL LOW (ref 36.0–46.0)
Hemoglobin: 7.9 g/dL — ABNORMAL LOW (ref 12.0–15.0)
MCH: 27.5 pg (ref 26.0–34.0)
MCHC: 32.1 g/dL (ref 30.0–36.0)
MCV: 85.7 fL (ref 80.0–100.0)
Platelets: 277 10*3/uL (ref 150–400)
RBC: 2.87 MIL/uL — ABNORMAL LOW (ref 3.87–5.11)
RDW: 13.9 % (ref 11.5–15.5)
WBC: 6.8 10*3/uL (ref 4.0–10.5)
nRBC: 0.4 % — ABNORMAL HIGH (ref 0.0–0.2)

## 2023-05-11 LAB — GLUCOSE, CAPILLARY: Glucose-Capillary: 116 mg/dL — ABNORMAL HIGH (ref 70–99)

## 2023-05-11 MED ORDER — FUROSEMIDE 40 MG PO TABS: 40.0000 mg | ORAL_TABLET | Freq: Every day | ORAL | 0 refills | Status: DC

## 2023-05-11 MED ORDER — AMIODARONE HCL 200 MG PO TABS: ORAL_TABLET | ORAL | 1 refills | Status: DC

## 2023-05-11 MED ORDER — POTASSIUM CHLORIDE CRYS ER 20 MEQ PO TBCR: 20.0000 meq | EXTENDED_RELEASE_TABLET | Freq: Every day | ORAL | 0 refills | Status: DC

## 2023-05-11 MED ORDER — FERROUS SULFATE 325 (65 FE) MG PO TBEC: 325.0000 mg | DELAYED_RELEASE_TABLET | Freq: Every day | ORAL | Status: DC

## 2023-05-11 MED ORDER — ASPIRIN 325 MG PO TBEC: 325.0000 mg | DELAYED_RELEASE_TABLET | Freq: Every day | ORAL | Status: DC

## 2023-05-11 MED ORDER — METOPROLOL TARTRATE 25 MG PO TABS: 12.5000 mg | ORAL_TABLET | Freq: Two times a day (BID) | ORAL | 1 refills | Status: DC

## 2023-05-11 MED ORDER — TRAMADOL HCL 50 MG PO TABS: 50.0000 mg | ORAL_TABLET | Freq: Four times a day (QID) | ORAL | 0 refills | Status: DC | PRN

## 2023-05-11 NOTE — Progress Notes (Cosign Needed Addendum)
      [address]       [place] [address]             [phone]        5 Days Post-Op Procedure(s) (LRB): CORONARY ARTERY BYPASS GRAFTING (CABG) X ONE USING LEFT INTERNAL MAMMARY ARTERY (N/A) AORTIC VALVE REPLACEMENT (AVR) USING 21 MM INSPIRIS RESILIA AORTIC VALVE (N/A) TRANSESOPHAGEAL ECHOCARDIOGRAM (N/A)  Subjective: Patient is without complaints this am. She wants to go home.   Objective: Vital signs in last 24 hours: Temp:  [98.1 F (36.7 C)-98.8 F (37.1 C)] 98.8 F (37.1 C) (10/21 0541) Pulse Rate:  [82-93] 93 (10/20 1509) Cardiac Rhythm: A-V Sequential paced (10/20 2140) Resp:  [15-20] 17 (10/21 0541) BP: (123-139)/(66-73) 128/66 (10/21 0541) SpO2:  [96 %-100 %] 96 % (10/21 0541) Weight:  [72.5 kg] 72.5 kg (10/21 0407)  Pre op weight 73 kg Current Weight  05/11/23 72.5 kg       Intake/Output from previous day: No intake/output data recorded.   Physical Exam:  Cardiovascular: RRR, no murmur Pulmonary: Slightly diminished bibasilar breath sounds Abdomen: Soft, non tender, bowel sounds present. Extremities: Trace bilateral lower extremity edema. Wound: Clean and dry.  No erythema or signs of infection.  Lab Results: CBC: Recent Labs    05/10/23 0307 05/11/23 0328  WBC 6.4 6.8  HGB 7.3* 7.9*  HCT 22.5* 24.6*  PLT 197 277   BMET:  Recent Labs    05/09/23 0354  NA 135  K 3.5  CL 100  CO2 27  GLUCOSE 115*  BUN 12  CREATININE 0.93  CALCIUM 7.7*    PT/INR:  Lab Results  Component Value Date   INR 1.6 (H) 05/06/2023   INR 1.0 05/04/2023   ABG:  INR: Will add last result for INR, ABG once components are confirmed Will add last 4 CBG results once components are confirmed  Assessment/Plan:  1. CV - SR. On Amiodarone 400 mg bid, Lopressor 12.5 mg bid. Will decrease Amiodarone to 200 mg bid. 2.  Pulmonary - On room air. Encourage incentive spirometer. 3. Volume Overload - On Lasix 40 mg daily. 4.  Expected post op  acute blood loss anemia - Last H and H 7.9 and 24.6 . Continue Trigels. 5. Discharge  Whitaker Holderman Margaretann Loveless PA-C 7:06 AM

## 2023-05-11 NOTE — Progress Notes (Addendum)
Pt walking on her own over the weekend. No questions regarding education. She sts she figured out she was doing IS wrong and now can pull 2000 ml. Ready for d/c.  [phone] [name] BS, ACSM-CEP 05/11/2023 8:56 AM

## 2023-05-11 NOTE — TOC Transition Note (Signed)
Transition of Care (TOC) - CM/SW Discharge Note Donn Pierini RN, BSN Transitions of Care Unit 4E- RN Case Manager See Treatment Team for direct phone #   Patient Details  Name: [name] MRN: [id number] Date of Birth: 1955-09-17  Transition of Care [facility]) CM/SW Contact:  Darrold Span, RN Phone Number: 05/11/2023, 9:48 AM   Clinical Narrative:    Pt stable for transition home today, Cm has been notified by [facility] liaison that TCTS office has referred pt for [facility] needs. Liaison to follow up with pt post discharge under [facility] office protocol.   Pt to return home w/ daughter to stay with her. Daughter to transport home   No further TOC needs noted.    Final next level of care: Home w Home Health Services Barriers to Discharge: No Barriers Identified   Patient Goals and CMS Choice CMS Medicare.gov Compare Post Acute Care list provided to:: Patient    Discharge Placement               Home w/ [facility]           Discharge Plan and Services Additional resources added to the After Visit Summary for     Discharge Planning Services: CM Consult Post Acute Care Choice: Home Health          DME Arranged: N/A DME Agency: NA         HH Agency: [facility] Date HH Agency Contacted: 05/11/23 Time HH Agency Contacted: [phone] Representative spoke with at [facility] Agency: Morrie Sheldon  Social Determinants of Health (SDOH) Interventions SDOH Screenings   Tobacco Use: Low Risk  (05/06/2023)     Readmission Risk Interventions    05/11/2023    9:48 AM  Readmission Risk Prevention Plan  Transportation Screening Complete  PCP or Specialist Appt within 5-7 Days Complete  Home Care Screening Complete  Medication Review (RN CM) Complete

## 2023-05-11 NOTE — Progress Notes (Signed)
Patient given discharge instructions. PIV removed. Telemetry box removed, CCMD notified. Patient taken to discharge lounge in wheelchair by staff. Discharge lounge notified prior to arrival.  Kenard Gower, RN

## 2023-05-12 DIAGNOSIS — D696 Thrombocytopenia, unspecified: Secondary | ICD-10-CM | POA: Diagnosis not present

## 2023-05-12 DIAGNOSIS — I4581 Long QT syndrome: Secondary | ICD-10-CM | POA: Diagnosis not present

## 2023-05-12 DIAGNOSIS — Z87442 Personal history of urinary calculi: Secondary | ICD-10-CM | POA: Diagnosis not present

## 2023-05-12 DIAGNOSIS — Z853 Personal history of malignant neoplasm of breast: Secondary | ICD-10-CM | POA: Diagnosis not present

## 2023-05-12 DIAGNOSIS — Z952 Presence of prosthetic heart valve: Secondary | ICD-10-CM | POA: Diagnosis not present

## 2023-05-12 DIAGNOSIS — I4430 Unspecified atrioventricular block: Secondary | ICD-10-CM | POA: Diagnosis not present

## 2023-05-12 DIAGNOSIS — E785 Hyperlipidemia, unspecified: Secondary | ICD-10-CM | POA: Diagnosis not present

## 2023-05-12 DIAGNOSIS — Z951 Presence of aortocoronary bypass graft: Secondary | ICD-10-CM | POA: Diagnosis not present

## 2023-05-12 DIAGNOSIS — Z48812 Encounter for surgical aftercare following surgery on the circulatory system: Secondary | ICD-10-CM | POA: Diagnosis not present

## 2023-05-12 DIAGNOSIS — I2510 Atherosclerotic heart disease of native coronary artery without angina pectoris: Secondary | ICD-10-CM | POA: Diagnosis not present

## 2023-05-12 DIAGNOSIS — I110 Hypertensive heart disease with heart failure: Secondary | ICD-10-CM | POA: Diagnosis not present

## 2023-05-12 DIAGNOSIS — Z7982 Long term (current) use of aspirin: Secondary | ICD-10-CM | POA: Diagnosis not present

## 2023-05-12 DIAGNOSIS — R739 Hyperglycemia, unspecified: Secondary | ICD-10-CM | POA: Diagnosis not present

## 2023-05-12 DIAGNOSIS — Z9011 Acquired absence of right breast and nipple: Secondary | ICD-10-CM | POA: Diagnosis not present

## 2023-05-12 DIAGNOSIS — K219 Gastro-esophageal reflux disease without esophagitis: Secondary | ICD-10-CM | POA: Diagnosis not present

## 2023-05-12 DIAGNOSIS — I509 Heart failure, unspecified: Secondary | ICD-10-CM | POA: Diagnosis not present

## 2023-05-12 DIAGNOSIS — R7303 Prediabetes: Secondary | ICD-10-CM | POA: Diagnosis not present

## 2023-05-12 DIAGNOSIS — Z950 Presence of cardiac pacemaker: Secondary | ICD-10-CM | POA: Diagnosis not present

## 2023-05-12 DIAGNOSIS — F419 Anxiety disorder, unspecified: Secondary | ICD-10-CM | POA: Diagnosis not present

## 2023-05-12 DIAGNOSIS — I051 Rheumatic mitral insufficiency: Secondary | ICD-10-CM | POA: Diagnosis not present

## 2023-05-12 MED FILL — Heparin Sodium (Porcine) Inj 1000 Unit/ML: Qty: 1000 | Status: AC

## 2023-05-12 MED FILL — Potassium Chloride Inj 2 mEq/ML: INTRAVENOUS | Qty: 40 | Status: AC

## 2023-05-12 MED FILL — Magnesium Sulfate Inj 50%: INTRAMUSCULAR | Qty: 10 | Status: AC

## 2023-05-13 ENCOUNTER — Other Ambulatory Visit: Payer: Self-pay | Admitting: Physician Assistant

## 2023-05-13 ENCOUNTER — Telehealth: Payer: Self-pay | Admitting: *Deleted

## 2023-05-13 DIAGNOSIS — I509 Heart failure, unspecified: Secondary | ICD-10-CM | POA: Diagnosis not present

## 2023-05-13 DIAGNOSIS — I2510 Atherosclerotic heart disease of native coronary artery without angina pectoris: Secondary | ICD-10-CM | POA: Diagnosis not present

## 2023-05-13 DIAGNOSIS — Z951 Presence of aortocoronary bypass graft: Secondary | ICD-10-CM | POA: Diagnosis not present

## 2023-05-13 DIAGNOSIS — Z48812 Encounter for surgical aftercare following surgery on the circulatory system: Secondary | ICD-10-CM | POA: Diagnosis not present

## 2023-05-13 DIAGNOSIS — D696 Thrombocytopenia, unspecified: Secondary | ICD-10-CM | POA: Diagnosis not present

## 2023-05-13 DIAGNOSIS — E785 Hyperlipidemia, unspecified: Secondary | ICD-10-CM | POA: Diagnosis not present

## 2023-05-13 DIAGNOSIS — F419 Anxiety disorder, unspecified: Secondary | ICD-10-CM | POA: Diagnosis not present

## 2023-05-13 DIAGNOSIS — I4430 Unspecified atrioventricular block: Secondary | ICD-10-CM | POA: Diagnosis not present

## 2023-05-13 DIAGNOSIS — R7303 Prediabetes: Secondary | ICD-10-CM | POA: Diagnosis not present

## 2023-05-13 DIAGNOSIS — I4581 Long QT syndrome: Secondary | ICD-10-CM | POA: Diagnosis not present

## 2023-05-13 DIAGNOSIS — Z9011 Acquired absence of right breast and nipple: Secondary | ICD-10-CM | POA: Diagnosis not present

## 2023-05-13 DIAGNOSIS — R739 Hyperglycemia, unspecified: Secondary | ICD-10-CM | POA: Diagnosis not present

## 2023-05-13 DIAGNOSIS — I110 Hypertensive heart disease with heart failure: Secondary | ICD-10-CM | POA: Diagnosis not present

## 2023-05-13 DIAGNOSIS — K219 Gastro-esophageal reflux disease without esophagitis: Secondary | ICD-10-CM | POA: Diagnosis not present

## 2023-05-13 DIAGNOSIS — I051 Rheumatic mitral insufficiency: Secondary | ICD-10-CM | POA: Diagnosis not present

## 2023-05-13 DIAGNOSIS — Z853 Personal history of malignant neoplasm of breast: Secondary | ICD-10-CM | POA: Diagnosis not present

## 2023-05-13 NOTE — Telephone Encounter (Signed)
Patient contacted the office stating she has swelling above sternal incision. Per patient, she just noticed swelling. Photo sent via mychart. Swelling at top of sternal incision. Patient denies redness, warmth, drainage, pain at area. Advised patient to monitor and to contact our office if swelling worsens. Patient verbalized understanding.

## 2023-05-14 ENCOUNTER — Telehealth (HOSPITAL_COMMUNITY): Payer: Self-pay

## 2023-05-14 MED FILL — Mannitol IV Soln 20%: INTRAVENOUS | Qty: 500 | Status: AC

## 2023-05-14 MED FILL — Sodium Chloride IV Soln 0.9%: INTRAVENOUS | Qty: 2000 | Status: AC

## 2023-05-14 MED FILL — Sodium Bicarbonate IV Soln 8.4%: INTRAVENOUS | Qty: 100 | Status: AC

## 2023-05-14 MED FILL — Albumin, Human Inj 5%: INTRAVENOUS | Qty: 250 | Status: AC

## 2023-05-14 MED FILL — Electrolyte-R (PH 7.4) Solution: INTRAVENOUS | Qty: 3000 | Status: AC

## 2023-05-14 MED FILL — Lidocaine HCl Local Preservative Free (PF) Inj 2%: INTRAMUSCULAR | Qty: 14 | Status: AC

## 2023-05-14 MED FILL — Calcium Chloride Inj 10%: INTRAVENOUS | Qty: 10 | Status: AC

## 2023-05-14 MED FILL — Heparin Sodium (Porcine) Inj 1000 Unit/ML: INTRAMUSCULAR | Qty: 20 | Status: AC

## 2023-05-14 NOTE — Progress Notes (Signed)
[address]       [name] [address]             [phone]       HPI: Patient returns for routine postoperative follow-up having undergone a bioprosthetic AVR and CABG x 1 by Dr. Leafy Ro on 05/06/23. The patient's early postoperative recovery while in the hospital was notable for expected postoperative acute blood loss anemia that improved with iron supplement. She otherwise had a routine postoperative course. Since hospital discharge the patient reports some sternal incisional pain especially at night but she is only taking 1 Tramadol per night as well as overall fatigue, lack of appetite and lack of sleep. She denies chest pain, chest tightness, SOB, dizziness, lower extremity edema and LOC. Her cardiology appointment is 11/06.   Current Outpatient Medications  Medication Sig Dispense Refill   acetaminophen (TYLENOL) 650 MG CR tablet Take 650 mg by mouth every 8 (eight) hours as needed for pain.     amiodarone (PACERONE) 200 MG tablet Take 200 mg bid for one week then take 200 mg daily thereafter 60 tablet 1   aspirin EC 325 MG tablet Take 1 tablet (325 mg total) by mouth daily.     Calcium Carbonate (CALCI-CHEW PO) Take 1 tablet by mouth in the morning.     calcium carbonate (TUMS EX) 750 MG chewable tablet Chew 2 tablets by mouth daily as needed for heartburn.     cholecalciferol (VITAMIN D3) 25 MCG (1000 UNIT) tablet Take 1,000 Units by mouth daily.     escitalopram (LEXAPRO) 10 MG tablet Take 10 mg by mouth daily.     ferrous sulfate 325 (65 FE) MG EC tablet Take 1 tablet (325 mg total) by mouth daily with breakfast. For one month then stop. If develops constipation, may take laxative or stop iron.     furosemide (LASIX) 40 MG tablet Take 1 tablet (40 mg total) by mouth daily. For 5 days then stop. 5 tablet 0   metoprolol tartrate (LOPRESSOR) 25 MG tablet Take 0.5 tablets (12.5 mg total) by mouth 2 (two) times daily. 60 tablet 1   Polyethyl Glycol-Propyl  Glycol (LUBRICATING EYE DROPS OP) Place 1 drop into both eyes daily as needed (dry eyes).     potassium chloride SA (KLOR-CON M) 20 MEQ tablet Take 1 tablet (20 mEq total) by mouth daily. For 5 days then stop. 5 tablet 0   rosuvastatin (CRESTOR) 10 MG tablet Take 10 mg by mouth daily.     traMADol (ULTRAM) 50 MG tablet Take 1 tablet (50 mg total) by mouth every 6 (six) hours as needed for moderate pain (pain score 4-6). 28 tablet 0   No current facility-administered medications for this visit.   Vitals: Today's Vitals   05/19/23 1259  BP: 109/70  Pulse: 85  Resp: 20  SpO2: 95%  Weight: 155 lb (70.3 kg)   Body mass index is 27.03 kg/m.  Physical Exam: General: Alert and oriented, no acute distress Neuro: Grossly intact CV: Regular rate and rhythm, no murmur Pulm: Clear to auscultation bilaterally GI: + BS, no distension, nontender Extremities: No edema Wound: Sternal incision is clean and dry and healing well without sign of infection   Diagnostic Tests: Read pending. Low lung volumes, bibasilar atelectasis improving  Impression/Plan: S/P CABG x 1 and AVR: The patient is progressing well from surgery. She is able to walk regularly around her house and walks outside to her mailbox daily. She admits  to overall fatigue but denies shortness of breath. CXR shows continued bibasilar atelectasis that looks to be improving. We reviewed continuing ambulation and using her incentive spirometer. She does admit to some sternal soreness and she is not sleeping well due to this. She is taking 1 Tramadol per night to help her sleep. We reviewed this was normal and should improve with time. Her incisions are healing well without sign of infection. She is tolerating her medications well, I will not make any medication changes at this time. We reviewed continued sternal precautions, no driving until cleared by our office and endocarditis prophylaxis. Plan to have her return to the clinic with Dr.  Leafy Ro in 2-3 weeks.   [name], PA-C [facility] [phone]

## 2023-05-14 NOTE — Telephone Encounter (Signed)
Pt insurance is active and benefits verified through [facility] Co-pay 0, DED 0/0 met, out of pocket 3500/402.79 met, co-insurance 0. no pre-authorization required. Passport, 05/14/2023@1141 , REF# [id number]    How many CR sessions are covered? (36 visits for TCR, 72 visits for ICR)72 Is this a lifetime maximum or an annual maximum? lifetime Has the member used any of these services to date? no Is there a time limit (weeks/months) on start of program and/or program completion? no

## 2023-05-15 DIAGNOSIS — R739 Hyperglycemia, unspecified: Secondary | ICD-10-CM | POA: Diagnosis not present

## 2023-05-15 DIAGNOSIS — I051 Rheumatic mitral insufficiency: Secondary | ICD-10-CM | POA: Diagnosis not present

## 2023-05-15 DIAGNOSIS — I2510 Atherosclerotic heart disease of native coronary artery without angina pectoris: Secondary | ICD-10-CM | POA: Diagnosis not present

## 2023-05-15 DIAGNOSIS — Z48812 Encounter for surgical aftercare following surgery on the circulatory system: Secondary | ICD-10-CM | POA: Diagnosis not present

## 2023-05-15 DIAGNOSIS — Z853 Personal history of malignant neoplasm of breast: Secondary | ICD-10-CM | POA: Diagnosis not present

## 2023-05-15 DIAGNOSIS — D696 Thrombocytopenia, unspecified: Secondary | ICD-10-CM | POA: Diagnosis not present

## 2023-05-15 DIAGNOSIS — R7303 Prediabetes: Secondary | ICD-10-CM | POA: Diagnosis not present

## 2023-05-15 DIAGNOSIS — E785 Hyperlipidemia, unspecified: Secondary | ICD-10-CM | POA: Diagnosis not present

## 2023-05-15 DIAGNOSIS — Z9011 Acquired absence of right breast and nipple: Secondary | ICD-10-CM | POA: Diagnosis not present

## 2023-05-15 DIAGNOSIS — I4430 Unspecified atrioventricular block: Secondary | ICD-10-CM | POA: Diagnosis not present

## 2023-05-15 DIAGNOSIS — K219 Gastro-esophageal reflux disease without esophagitis: Secondary | ICD-10-CM | POA: Diagnosis not present

## 2023-05-15 DIAGNOSIS — I110 Hypertensive heart disease with heart failure: Secondary | ICD-10-CM | POA: Diagnosis not present

## 2023-05-15 DIAGNOSIS — I509 Heart failure, unspecified: Secondary | ICD-10-CM | POA: Diagnosis not present

## 2023-05-15 DIAGNOSIS — F419 Anxiety disorder, unspecified: Secondary | ICD-10-CM | POA: Diagnosis not present

## 2023-05-15 DIAGNOSIS — I4581 Long QT syndrome: Secondary | ICD-10-CM | POA: Diagnosis not present

## 2023-05-15 DIAGNOSIS — Z951 Presence of aortocoronary bypass graft: Secondary | ICD-10-CM | POA: Diagnosis not present

## 2023-05-18 ENCOUNTER — Telehealth (HOSPITAL_COMMUNITY): Payer: Self-pay

## 2023-05-18 ENCOUNTER — Other Ambulatory Visit: Payer: Self-pay | Admitting: Thoracic Surgery (Cardiothoracic Vascular Surgery)

## 2023-05-18 DIAGNOSIS — I110 Hypertensive heart disease with heart failure: Secondary | ICD-10-CM | POA: Diagnosis not present

## 2023-05-18 DIAGNOSIS — Z48812 Encounter for surgical aftercare following surgery on the circulatory system: Secondary | ICD-10-CM | POA: Diagnosis not present

## 2023-05-18 DIAGNOSIS — F419 Anxiety disorder, unspecified: Secondary | ICD-10-CM | POA: Diagnosis not present

## 2023-05-18 DIAGNOSIS — Z853 Personal history of malignant neoplasm of breast: Secondary | ICD-10-CM | POA: Diagnosis not present

## 2023-05-18 DIAGNOSIS — I4430 Unspecified atrioventricular block: Secondary | ICD-10-CM | POA: Diagnosis not present

## 2023-05-18 DIAGNOSIS — Z9011 Acquired absence of right breast and nipple: Secondary | ICD-10-CM | POA: Diagnosis not present

## 2023-05-18 DIAGNOSIS — I350 Nonrheumatic aortic (valve) stenosis: Secondary | ICD-10-CM

## 2023-05-18 DIAGNOSIS — K219 Gastro-esophageal reflux disease without esophagitis: Secondary | ICD-10-CM | POA: Diagnosis not present

## 2023-05-18 DIAGNOSIS — R739 Hyperglycemia, unspecified: Secondary | ICD-10-CM | POA: Diagnosis not present

## 2023-05-18 DIAGNOSIS — D696 Thrombocytopenia, unspecified: Secondary | ICD-10-CM | POA: Diagnosis not present

## 2023-05-18 DIAGNOSIS — R7303 Prediabetes: Secondary | ICD-10-CM | POA: Diagnosis not present

## 2023-05-18 DIAGNOSIS — Z951 Presence of aortocoronary bypass graft: Secondary | ICD-10-CM | POA: Diagnosis not present

## 2023-05-18 DIAGNOSIS — I4581 Long QT syndrome: Secondary | ICD-10-CM | POA: Diagnosis not present

## 2023-05-18 DIAGNOSIS — E785 Hyperlipidemia, unspecified: Secondary | ICD-10-CM | POA: Diagnosis not present

## 2023-05-18 DIAGNOSIS — I051 Rheumatic mitral insufficiency: Secondary | ICD-10-CM | POA: Diagnosis not present

## 2023-05-18 DIAGNOSIS — I2510 Atherosclerotic heart disease of native coronary artery without angina pectoris: Secondary | ICD-10-CM | POA: Diagnosis not present

## 2023-05-18 DIAGNOSIS — I509 Heart failure, unspecified: Secondary | ICD-10-CM | POA: Diagnosis not present

## 2023-05-18 NOTE — Telephone Encounter (Signed)
Pt called and left a vm in regards to her cardiac rehab referral. Attempted to call pt in regards to Cardiac rehab. LM on VM

## 2023-05-19 ENCOUNTER — Encounter: Payer: Self-pay | Admitting: Physician Assistant

## 2023-05-19 ENCOUNTER — Ambulatory Visit
Admission: RE | Admit: 2023-05-19 | Discharge: 2023-05-19 | Disposition: A | Discharge status: Home / Self Care | Payer: Medicare Other | Source: Ambulatory Visit | Attending: Thoracic Surgery (Cardiothoracic Vascular Surgery) | Admitting: Thoracic Surgery (Cardiothoracic Vascular Surgery)

## 2023-05-19 ENCOUNTER — Ambulatory Visit (INDEPENDENT_AMBULATORY_CARE_PROVIDER_SITE_OTHER): Payer: Self-pay | Admitting: Physician Assistant

## 2023-05-19 VITALS — BP 109/70 | HR 85 | Resp 20 | Wt 155.0 lb

## 2023-05-19 DIAGNOSIS — J9811 Atelectasis: Secondary | ICD-10-CM | POA: Diagnosis not present

## 2023-05-19 DIAGNOSIS — Z952 Presence of prosthetic heart valve: Secondary | ICD-10-CM

## 2023-05-19 DIAGNOSIS — I350 Nonrheumatic aortic (valve) stenosis: Secondary | ICD-10-CM

## 2023-05-19 DIAGNOSIS — Z951 Presence of aortocoronary bypass graft: Secondary | ICD-10-CM | POA: Diagnosis not present

## 2023-05-19 NOTE — Patient Instructions (Signed)
Continue to avoid any heavy lifting or strenuous use of your arms or shoulders for at least a total of three months from the time of surgery.  After three months you may gradually increase how much you lift or otherwise use your arms or chest as tolerated, with limits based upon whether or not activities lead to the return of significant discomfort.  Endocarditis is a potentially serious infection of heart valves or inside lining of the heart.  It occurs more commonly in patients with diseased heart valves (such as patient's with aortic or mitral valve disease) and in patients who have undergone heart valve repair or replacement.  Certain surgical and dental procedures may put you at risk, such as dental cleaning, other dental procedures, or any surgery involving the respiratory, urinary, gastrointestinal tract, gallbladder or prostate gland.   To minimize your chances for develooping endocarditis, maintain good oral health and seek prompt medical attention for any infections involving the mouth, teeth, gums, skin or urinary tract.    Always notify your doctor or dentist about your underlying heart valve condition before having any invasive procedures. You will need to take antibiotics before certain procedures, including all routine dental cleanings or other dental procedures.  Your cardiologist or dentist should prescribe these antibiotics for you to be taken ahead of time.      

## 2023-05-20 ENCOUNTER — Ambulatory Visit: Payer: Medicare Other | Admitting: Cardiology

## 2023-05-20 DIAGNOSIS — Z48812 Encounter for surgical aftercare following surgery on the circulatory system: Secondary | ICD-10-CM | POA: Diagnosis not present

## 2023-05-20 DIAGNOSIS — D696 Thrombocytopenia, unspecified: Secondary | ICD-10-CM | POA: Diagnosis not present

## 2023-05-20 DIAGNOSIS — I4581 Long QT syndrome: Secondary | ICD-10-CM | POA: Diagnosis not present

## 2023-05-20 DIAGNOSIS — K219 Gastro-esophageal reflux disease without esophagitis: Secondary | ICD-10-CM | POA: Diagnosis not present

## 2023-05-20 DIAGNOSIS — I110 Hypertensive heart disease with heart failure: Secondary | ICD-10-CM | POA: Diagnosis not present

## 2023-05-20 DIAGNOSIS — F419 Anxiety disorder, unspecified: Secondary | ICD-10-CM | POA: Diagnosis not present

## 2023-05-20 DIAGNOSIS — Z853 Personal history of malignant neoplasm of breast: Secondary | ICD-10-CM | POA: Diagnosis not present

## 2023-05-20 DIAGNOSIS — R7303 Prediabetes: Secondary | ICD-10-CM | POA: Diagnosis not present

## 2023-05-20 DIAGNOSIS — R739 Hyperglycemia, unspecified: Secondary | ICD-10-CM | POA: Diagnosis not present

## 2023-05-20 DIAGNOSIS — E785 Hyperlipidemia, unspecified: Secondary | ICD-10-CM | POA: Diagnosis not present

## 2023-05-20 DIAGNOSIS — I4430 Unspecified atrioventricular block: Secondary | ICD-10-CM | POA: Diagnosis not present

## 2023-05-20 DIAGNOSIS — Z950 Presence of cardiac pacemaker: Secondary | ICD-10-CM | POA: Insufficient documentation

## 2023-05-20 DIAGNOSIS — Z9011 Acquired absence of right breast and nipple: Secondary | ICD-10-CM | POA: Diagnosis not present

## 2023-05-20 DIAGNOSIS — Z951 Presence of aortocoronary bypass graft: Secondary | ICD-10-CM | POA: Diagnosis not present

## 2023-05-20 DIAGNOSIS — I2510 Atherosclerotic heart disease of native coronary artery without angina pectoris: Secondary | ICD-10-CM | POA: Diagnosis not present

## 2023-05-20 DIAGNOSIS — I509 Heart failure, unspecified: Secondary | ICD-10-CM | POA: Diagnosis not present

## 2023-05-20 DIAGNOSIS — I051 Rheumatic mitral insufficiency: Secondary | ICD-10-CM | POA: Diagnosis not present

## 2023-05-22 ENCOUNTER — Encounter: Payer: Self-pay | Admitting: Internal Medicine

## 2023-05-22 ENCOUNTER — Ambulatory Visit: Payer: Medicare Other | Attending: Internal Medicine | Admitting: Internal Medicine

## 2023-05-22 VITALS — BP 112/68 | HR 80 | Ht 63.5 in | Wt 157.6 lb

## 2023-05-22 DIAGNOSIS — R7303 Prediabetes: Secondary | ICD-10-CM | POA: Diagnosis not present

## 2023-05-22 DIAGNOSIS — Z853 Personal history of malignant neoplasm of breast: Secondary | ICD-10-CM | POA: Diagnosis not present

## 2023-05-22 DIAGNOSIS — I4430 Unspecified atrioventricular block: Secondary | ICD-10-CM | POA: Diagnosis not present

## 2023-05-22 DIAGNOSIS — E785 Hyperlipidemia, unspecified: Secondary | ICD-10-CM | POA: Diagnosis not present

## 2023-05-22 DIAGNOSIS — I509 Heart failure, unspecified: Secondary | ICD-10-CM | POA: Diagnosis not present

## 2023-05-22 DIAGNOSIS — I441 Atrioventricular block, second degree: Secondary | ICD-10-CM

## 2023-05-22 DIAGNOSIS — I110 Hypertensive heart disease with heart failure: Secondary | ICD-10-CM | POA: Diagnosis not present

## 2023-05-22 DIAGNOSIS — K219 Gastro-esophageal reflux disease without esophagitis: Secondary | ICD-10-CM | POA: Diagnosis not present

## 2023-05-22 DIAGNOSIS — Z950 Presence of cardiac pacemaker: Secondary | ICD-10-CM

## 2023-05-22 DIAGNOSIS — D696 Thrombocytopenia, unspecified: Secondary | ICD-10-CM | POA: Diagnosis not present

## 2023-05-22 DIAGNOSIS — I2510 Atherosclerotic heart disease of native coronary artery without angina pectoris: Secondary | ICD-10-CM | POA: Diagnosis not present

## 2023-05-22 DIAGNOSIS — Z9011 Acquired absence of right breast and nipple: Secondary | ICD-10-CM | POA: Diagnosis not present

## 2023-05-22 DIAGNOSIS — Z951 Presence of aortocoronary bypass graft: Secondary | ICD-10-CM | POA: Diagnosis not present

## 2023-05-22 DIAGNOSIS — F419 Anxiety disorder, unspecified: Secondary | ICD-10-CM | POA: Diagnosis not present

## 2023-05-22 DIAGNOSIS — Z48812 Encounter for surgical aftercare following surgery on the circulatory system: Secondary | ICD-10-CM | POA: Diagnosis not present

## 2023-05-22 DIAGNOSIS — R739 Hyperglycemia, unspecified: Secondary | ICD-10-CM | POA: Diagnosis not present

## 2023-05-22 DIAGNOSIS — I4581 Long QT syndrome: Secondary | ICD-10-CM | POA: Diagnosis not present

## 2023-05-22 DIAGNOSIS — I051 Rheumatic mitral insufficiency: Secondary | ICD-10-CM | POA: Diagnosis not present

## 2023-05-22 LAB — CUP PACEART INCLINIC DEVICE CHECK
Battery Remaining Longevity: 148 mo
Battery Voltage: 3.13 V
Brady Statistic AP VP Percent: 56.17 %
Brady Statistic AP VS Percent: 0 %
Brady Statistic AS VP Percent: 43.78 %
Brady Statistic AS VS Percent: 0.05 %
Brady Statistic RA Percent Paced: 56.18 %
Brady Statistic RV Percent Paced: 99.95 %
Date Time Interrogation Session: 20241101115949
Implantable Lead Connection Status: 753985
Implantable Lead Connection Status: 753985
Implantable Lead Implant Date: 20240703
Implantable Lead Implant Date: 20240703
Implantable Lead Location: 753859
Implantable Lead Location: 753860
Implantable Lead Model: 3830
Implantable Lead Model: 5076
Implantable Pulse Generator Implant Date: 20240703
Lead Channel Impedance Value: 285 Ohm
Lead Channel Impedance Value: 418 Ohm
Lead Channel Impedance Value: 456 Ohm
Lead Channel Impedance Value: 665 Ohm
Lead Channel Pacing Threshold Amplitude: 1 V
Lead Channel Pacing Threshold Amplitude: 1 V
Lead Channel Pacing Threshold Amplitude: 1.25 V
Lead Channel Pacing Threshold Pulse Width: 0.4 ms
Lead Channel Pacing Threshold Pulse Width: 0.4 ms
Lead Channel Pacing Threshold Pulse Width: 0.4 ms
Lead Channel Sensing Intrinsic Amplitude: 1.125 mV
Lead Channel Sensing Intrinsic Amplitude: 1.25 mV
Lead Channel Sensing Intrinsic Amplitude: 10.5 mV
Lead Channel Sensing Intrinsic Amplitude: 13.125 mV
Lead Channel Setting Pacing Amplitude: 2 V
Lead Channel Setting Pacing Amplitude: 2.5 V
Lead Channel Setting Pacing Pulse Width: 0.4 ms
Lead Channel Setting Sensing Sensitivity: 1.2 mV
Zone Setting Status: 755011

## 2023-05-22 NOTE — Progress Notes (Unsigned)
Patient Care Team: Aliene Beams, MD as PCP - General (Family Medicine) Little Ishikawa, MD as PCP - Cardiology (Cardiology) Little Ishikawa, MD as Consulting Physician (Cardiology)   HPI  [name] is a 67 y.o. female seen in follow-up for a pacemaker implanted 7/24 for bradycardia with 2: 1 AV block in the setting of left bundle branch block.  Known largely asymptomatic severe aortic stenosis which was being followed clinically she then underwent surgery and CABG x 110/24  Developed shortness of breath while walking in [place] following pacemaker implantation.  Decision was made to proceed with surgery.  Preprocedure repeat catheterization had demonstrated a high-grade new LAD lesion  Now 2 weeks afterwards.  Has had no tachypalpitations.  Feeling better daily.  Pain and some dyspnea.   DATE TEST EF    7/22/ [facility]   % No obstructive CAD  8/22 cMRI  64% Bicuspid AoV Calcium  12 8   /23 Echo   60-65 % AVA 0.8    4/24 Echo  55-60% Grad mean 26 Low Flow Severe AS    Date Cr K Hgb  8/22 1.15 4.8 12.2  10/24  0.93 3.5 .3     Records and Results Reviewed   Past Medical History:  Diagnosis Date   Anxiety    Aortic valve stenosis    Arthritis    Breast cancer (HCC) 2004   right breast; ER/PR+, Her2+   Cancer (HCC) 10/2002   right breast lumpectomy   Coronary artery disease    Dysrhythmia    2nd Degree AV block   GERD (gastroesophageal reflux disease)    Heart murmur    History of kidney stones    HX   HTN (hypertension)    Hyperlipidemia    Morton neuroma    Personal history of chemotherapy 11/19/2002   Personal history of radiation therapy 02/19/2003   Pneumonia    Pre-diabetes    Presence of permanent cardiac pacemaker 01/21/2023   Medtronic    Past Surgical History:  Procedure Laterality Date   AORTIC VALVE REPLACEMENT N/A 05/06/2023   Procedure: AORTIC VALVE REPLACEMENT (AVR) USING 21 MM INSPIRIS RESILIA AORTIC VALVE;   Surgeon: Eugenio Hoes, MD;  Location: [facility] OR;  Service: Open Heart Surgery;  Laterality: N/A;   BREAST BIOPSY Right 2019   BREAST LUMPECTOMY Right    BREAST SURGERY  10/2002   lumpectomy / right side   COLONOSCOPY  2016   CORONARY ARTERY BYPASS GRAFT N/A 05/06/2023   Procedure: CORONARY ARTERY BYPASS GRAFTING (CABG) X ONE USING LEFT INTERNAL MAMMARY ARTERY;  Surgeon: Eugenio Hoes, MD;  Location: [facility] OR;  Service: Open Heart Surgery;  Laterality: N/A;   PACEMAKER IMPLANT N/A 01/21/2023   Procedure: PACEMAKER IMPLANT;  Surgeon: Duke Salvia, MD;  Location: [facility] INVASIVE CV LAB;  Service: Cardiovascular;  Laterality: N/A;   RIGHT/LEFT HEART CATH AND CORONARY ANGIOGRAPHY N/A 02/13/2021   Procedure: RIGHT/LEFT HEART CATH AND CORONARY ANGIOGRAPHY;  Surgeon: Kathleene Hazel, MD;  Location: [facility] INVASIVE CV LAB;  Service: Cardiovascular;  Laterality: N/A;   RIGHT/LEFT HEART CATH AND CORONARY ANGIOGRAPHY N/A 04/14/2023   Procedure: RIGHT/LEFT HEART CATH AND CORONARY ANGIOGRAPHY;  Surgeon: [place], Peter M, MD;  Location: [facility] INVASIVE CV LAB;  Service: Cardiovascular;  Laterality: N/A;   TEE WITHOUT CARDIOVERSION N/A 05/06/2023   Procedure: TRANSESOPHAGEAL ECHOCARDIOGRAM;  Surgeon: Eugenio Hoes, MD;  Location: [facility] OR;  Service: Open Heart Surgery;  Laterality: N/A;  Current Meds  Medication Sig   acetaminophen (TYLENOL) 650 MG CR tablet Take 650 mg by mouth every 8 (eight) hours as needed for pain.   amiodarone (PACERONE) 200 MG tablet Take 200 mg bid for one week then take 200 mg daily thereafter   aspirin EC 325 MG tablet Take 1 tablet (325 mg total) by mouth daily.   Calcium Carbonate (CALCI-CHEW PO) Take 1 tablet by mouth in the morning.   calcium carbonate (TUMS EX) 750 MG chewable tablet Chew 2 tablets by mouth daily as needed for heartburn.   cholecalciferol (VITAMIN D3) 25 MCG (1000 UNIT) tablet Take 1,000 Units by mouth daily.   escitalopram (LEXAPRO) 10 MG tablet Take 10 mg by mouth  daily.   ferrous sulfate 325 (65 FE) MG EC tablet Take 1 tablet (325 mg total) by mouth daily with breakfast. For one month then stop. If develops constipation, may take laxative or stop iron.   metoprolol tartrate (LOPRESSOR) 25 MG tablet Take 0.5 tablets (12.5 mg total) by mouth 2 (two) times daily.   Polyethyl Glycol-Propyl Glycol (LUBRICATING EYE DROPS OP) Place 1 drop into both eyes daily as needed (dry eyes).   rosuvastatin (CRESTOR) 10 MG tablet Take 10 mg by mouth daily.   traMADol (ULTRAM) 50 MG tablet Take 1 tablet (50 mg total) by mouth every 6 (six) hours as needed for moderate pain (pain score 4-6).    Allergies  Allergen Reactions   Wound Dressing Adhesive     Blisters from bandages      Review of Systems negative except from HPI and PMH  Physical Exam BP 112/68   Pulse 80   Ht 5' 3.5" (1.613 m)   Wt 157 lb 9.6 oz (71.5 kg)   SpO2 97%   BMI 27.48 kg/m  Well developed and well nourished in no acute distress HENT normal E scleral and icterus clear Neck Supple JVP flat; carotids brisk and full Clear to ausculation Device pocket well healed; without hematoma or erythema.  There is no tethering  Regular rate and rhythm, no murmurs gallops or rub Soft with active bowel sounds No clubbing cyanosis  Edema Alert and oriented, grossly normal motor and sensory function Skin Warm and Dry  ECG AV pacing with a negative QRS lead I and a negative QRS lead I  intervals 21/13/42   Estimated Creatinine Clearance: 56.3 mL/min (by C-G formula based on SCr of 0.93 mg/dL).   Assessment and  Plan 2:1 AVBlock with LBBB   AoStenosis --CABG/AVR 10/24  Pacemaker-Medtronic (DOI 7/24)  Coronary artery disease  Patient's device is functioning normally.  I suspect that the increased heart rate was sufficient to trigger ischemia causing her dyspnea and the symptoms that were ultimately addressed by CABG/AVR.  On amiodarone for postoperative atrial fibrillation prophylaxis.   Will have her stop it in 2 weeks.  Device function is normal with a relatively narrow QRS secondary left bundle branch area pacing    Current medicines are reviewed at length with the patient today .  The patient does not  have concerns regarding medicines.

## 2023-05-22 NOTE — Patient Instructions (Signed)
Medication Instructions:  Your physician has recommended you make the following change in your medication:   ** Stop Amiodarone in 2 weeks   *If you need a refill on your cardiac medications before your next appointment, please call your pharmacy*   Lab Work: None ordered.  If you have labs (blood work) drawn today and your tests are completely normal, you will receive your results only by: MyChart Message (if you have MyChart) OR A paper copy in the mail If you have any lab test that is abnormal or we need to change your treatment, we will call you to review the results.   Testing/Procedures: None ordered.    Follow-Up: At [facility], you and your health needs are our priority.  As part of our continuing mission to provide you with exceptional heart care, we have created designated Provider Care Teams.  These Care Teams include your primary Cardiologist (physician) and Advanced Practice Providers (APPs -  Physician Assistants and Nurse Practitioners) who all work together to provide you with the care you need, when you need it.  We recommend signing up for the patient portal called "MyChart".  Sign up information is provided on this After Visit Summary.  MyChart is used to connect with patients for Virtual Visits (Telemedicine).  Patients are able to view lab/test results, encounter notes, upcoming appointments, etc.  Non-urgent messages can be sent to your provider as well.   To learn more about what you can do with MyChart, go to [email or web address].    Your next appointment:   9 months with Dr Graciela Husbands

## 2023-05-23 DIAGNOSIS — I051 Rheumatic mitral insufficiency: Secondary | ICD-10-CM | POA: Diagnosis not present

## 2023-05-23 DIAGNOSIS — Z48812 Encounter for surgical aftercare following surgery on the circulatory system: Secondary | ICD-10-CM | POA: Diagnosis not present

## 2023-05-23 DIAGNOSIS — K219 Gastro-esophageal reflux disease without esophagitis: Secondary | ICD-10-CM | POA: Diagnosis not present

## 2023-05-23 DIAGNOSIS — I4581 Long QT syndrome: Secondary | ICD-10-CM | POA: Diagnosis not present

## 2023-05-23 DIAGNOSIS — Z9011 Acquired absence of right breast and nipple: Secondary | ICD-10-CM | POA: Diagnosis not present

## 2023-05-23 DIAGNOSIS — D696 Thrombocytopenia, unspecified: Secondary | ICD-10-CM | POA: Diagnosis not present

## 2023-05-23 DIAGNOSIS — I4430 Unspecified atrioventricular block: Secondary | ICD-10-CM | POA: Diagnosis not present

## 2023-05-23 DIAGNOSIS — I2510 Atherosclerotic heart disease of native coronary artery without angina pectoris: Secondary | ICD-10-CM | POA: Diagnosis not present

## 2023-05-23 DIAGNOSIS — I509 Heart failure, unspecified: Secondary | ICD-10-CM | POA: Diagnosis not present

## 2023-05-23 DIAGNOSIS — R7303 Prediabetes: Secondary | ICD-10-CM | POA: Diagnosis not present

## 2023-05-23 DIAGNOSIS — Z951 Presence of aortocoronary bypass graft: Secondary | ICD-10-CM | POA: Diagnosis not present

## 2023-05-23 DIAGNOSIS — I110 Hypertensive heart disease with heart failure: Secondary | ICD-10-CM | POA: Diagnosis not present

## 2023-05-23 DIAGNOSIS — Z853 Personal history of malignant neoplasm of breast: Secondary | ICD-10-CM | POA: Diagnosis not present

## 2023-05-23 DIAGNOSIS — F419 Anxiety disorder, unspecified: Secondary | ICD-10-CM | POA: Diagnosis not present

## 2023-05-23 DIAGNOSIS — E785 Hyperlipidemia, unspecified: Secondary | ICD-10-CM | POA: Diagnosis not present

## 2023-05-23 DIAGNOSIS — R739 Hyperglycemia, unspecified: Secondary | ICD-10-CM | POA: Diagnosis not present

## 2023-05-24 NOTE — Progress Notes (Unsigned)
Cardiology Office Note:    Date:  05/27/2023   ID:  Tasha, Cox 03-Sep-1955, MRN [id number]  PCP:  Aliene Beams, MD   [facility] Providers Cardiologist:  Little Ishikawa, MD     Referring MD: Aliene Beams, MD   Chief Complaint  Patient presents with   Follow-up    Post sternotomy    History of Present Illness:    [name] is a 67 y.o. female with a hx of low flow low gradient AS, CAD now s/p CABG x 1 and AVR 05/06/23, AV block s/p PPM (01/21/23), HLD.  Echocardiogram 2008 with preserved EF and mild MR with mild AI.  Echocardiogram 2021 showed EF 65 to 70%, normal RV, mild AAS, mild AI, mild MR.  Echocardiogram 12/27/2020 showed an LVEF 40-45% with mild RV dysfunction, moderate to severe aortic stenosis.  She underwent right and left heart catheterization 02/13/2021 that showed no evidence of CAD and confirmed moderate to severe aortic stenosis with a mean gradient of 27 mmHg, AVA 1.1 cm.  CTA showed tricuspid aortic valve with partial fusion of the right coronary cusp and left coronary cusp.  Fortunately EF improved to normal, , however follow-up echocardiograms show progression of aortic stenosis.  She was found to have heart block and underwent heart monitor that showed 2030 episodes of AV block including second-degree Mobitz 2 and third-degree heart block.  She is now status post PPM placement with Dr. Graciela Husbands on 01/21/2023.  Due to new dyspnea on exertion while walking in [place], she was referred to CT surgery and structural heart team for evaluation of aortic valve replacement.  She was felt to be a good candidate for SAVR.  Repeat right and left heart catheterization showed new single-vessel obstructive CAD with 90% in the mid LAD, and severe aortic stenosis with a mean gradient 927 mmHg and AVA 0.9 cm.  She had mild pulmonary hypertension with PAP 39/22 and mean 31 mmHg.  She underwent CABG x 1 with LIMA-LAD and AVR on 05/06/2023.  She was treated  with prophylactic amiodarone for prevention of atrial fibrillation, decreased to 200 mg twice daily at discharge.  She was discharged on 05/11/2023.  She was doing well at follow-up with Dr. Graciela Husbands on 05/22/2023.  She presents today for routine cardiology follow-up. She was advised to stop amiodarone by Dr. Graciela Husbands, something I echo, but she is not going to make any changes until she sees the surgeon next week. She is walking every day about 30 min per day.    Past Medical History:  Diagnosis Date   Anxiety    Aortic valve stenosis    Arthritis    Breast cancer (HCC) 2004   right breast; ER/PR+, Her2+   Cancer (HCC) 10/2002   right breast lumpectomy   Coronary artery disease    Dysrhythmia    2nd Degree AV block   GERD (gastroesophageal reflux disease)    Heart murmur    History of kidney stones    HX   HTN (hypertension)    Hyperlipidemia    Morton neuroma    Personal history of chemotherapy 11/19/2002   Personal history of radiation therapy 02/19/2003   Pneumonia    Pre-diabetes    Presence of permanent cardiac pacemaker 01/21/2023   [employer]tronic    Past Surgical History:  Procedure Laterality Date   AORTIC VALVE REPLACEMENT N/A 05/06/2023   Procedure: AORTIC VALVE REPLACEMENT (AVR) USING 21 MM INSPIRIS RESILIA AORTIC VALVE;  Surgeon: Eugenio Hoes,  MD;  Location: [facility] OR;  Service: Open Heart Surgery;  Laterality: N/A;   BREAST BIOPSY Right 2019   BREAST LUMPECTOMY Right    BREAST SURGERY  10/2002   lumpectomy / right side   COLONOSCOPY  2016   CORONARY ARTERY BYPASS GRAFT N/A 05/06/2023   Procedure: CORONARY ARTERY BYPASS GRAFTING (CABG) X ONE USING LEFT INTERNAL MAMMARY ARTERY;  Surgeon: Eugenio Hoes, MD;  Location: [facility] OR;  Service: Open Heart Surgery;  Laterality: N/A;   PACEMAKER IMPLANT N/A 01/21/2023   Procedure: PACEMAKER IMPLANT;  Surgeon: Kandiss Ihrig Salvia, MD;  Location: [facility] INVASIVE CV LAB;  Service: Cardiovascular;  Laterality: N/A;   RIGHT/LEFT HEART CATH AND  CORONARY ANGIOGRAPHY N/A 02/13/2021   Procedure: RIGHT/LEFT HEART CATH AND CORONARY ANGIOGRAPHY;  Surgeon: Kathleene Hazel, MD;  Location: [facility] INVASIVE CV LAB;  Service: Cardiovascular;  Laterality: N/A;   RIGHT/LEFT HEART CATH AND CORONARY ANGIOGRAPHY N/A 04/14/2023   Procedure: RIGHT/LEFT HEART CATH AND CORONARY ANGIOGRAPHY;  Surgeon: Swazil[place], Peter M, MD;  Location: [facility] INVASIVE CV LAB;  Service: Cardiovascular;  Laterality: N/A;   TEE WITHOUT CARDIOVERSION N/A 05/06/2023   Procedure: TRANSESOPHAGEAL ECHOCARDIOGRAM;  Surgeon: Eugenio Hoes, MD;  Location: [facility] OR;  Service: Open Heart Surgery;  Laterality: N/A;    Current Medications: Current Meds  Medication Sig   acetaminophen (TYLENOL) 650 MG CR tablet Take 650 mg by mouth every 8 (eight) hours as needed for pain.   amiodarone (PACERONE) 200 MG tablet Take 200 mg bid for one week then take 200 mg daily thereafter   aspirin EC 325 MG tablet Take 1 tablet (325 mg total) by mouth daily.   Calcium Carbonate (CALCI-CHEW PO) Take 1 tablet by mouth in the morning.   calcium carbonate (TUMS EX) 750 MG chewable tablet Chew 2 tablets by mouth daily as needed for heartburn.   cholecalciferol (VITAMIN D3) 25 MCG (1000 UNIT) tablet Take 1,000 Units by mouth daily.   escitalopram (LEXAPRO) 10 MG tablet Take 10 mg by mouth daily.   metoprolol tartrate (LOPRESSOR) 25 MG tablet Take 0.5 tablets (12.5 mg total) by mouth 2 (two) times daily.   Polyethyl Glycol-Propyl Glycol (LUBRICATING EYE DROPS OP) Place 1 drop into both eyes daily as needed (dry eyes).   rosuvastatin (CRESTOR) 10 MG tablet Take 10 mg by mouth daily.   traMADol (ULTRAM) 50 MG tablet Take 1 tablet (50 mg total) by mouth every 6 (six) hours as needed for moderate pain (pain score 4-6).     Allergies:   Wound dressing adhesive   Social History   Socioeconomic History   Marital status: Divorced    Spouse name: Not on file   Number of children: 2   Years of education: Not on file    Highest education level: Not on file  Occupational History   Occupation: Retired-Customer Financial planner  Tobacco Use   Smoking status: Never   Smokeless tobacco: Never  Vaping Use   Vaping status: Never Used  Substance and Sexual Activity   Alcohol use: Not Currently    Alcohol/week: 3.0 standard drinks of alcohol    Types: 3 Standard drinks or equivalent per week   Drug use: No   Sexual activity: Yes  Other Topics Concern   Not on file  Social History Narrative   Not on file   Social Determinants of Health   Financial Resource Strain: Not on file  Food Insecurity: Not on file  Transportation Needs: Not on file  Physical Activity: Not on  file  Stress: Not on file (09/01/2019)  Social Connections: Not on file     Family History: The patient's family history includes Alzheimer's disease in her paternal aunt and paternal uncle; Bladder Cancer in her brother; Diabetes in her brother, maternal aunt, maternal uncle, and mother; Heart Problems in her maternal uncle; Heart attack in her maternal grandfather, paternal grandfather, and paternal grandmother; Heart disease in her brother, maternal aunt, mother, and paternal aunt; Kidney failure in her brother and mother; Lung cancer (age of onset: 58) in her father; Skin cancer in her father and paternal uncle. There is no history of Breast cancer.  ROS:   Please see the history of present illness.     All other systems reviewed and are negative.  EKGs/Labs/Other Studies Reviewed:    The following studies were reviewed today:  Cardiac Studies & Procedures   CARDIAC CATHETERIZATION  CARDIAC CATHETERIZATION 04/14/2023  Narrative Single vessel obstructive CAD. New 90% stenosis in the mid LAD Severe aortic stenosis. Mean AV gradient 27 mm Hg. AVA 0.9 cm squared with index 0.5. Mild- moderately elevated LV filling pressures. PCWP 29/28 with mean 20 mm Hg. LVEDP 25 mm Hg Mild pulmonary HTN. PAP 39/22 with mean 31 mm Hg Normal  cardiac output 4.78 L/min index 2.7  Plan: proceed with AVR and LIMA to LAD  Findings Coronary Findings Diagnostic  Dominance: Right  Left Main Vessel was injected. Vessel is normal in caliber. Vessel is angiographically normal.  Left Anterior Descending Prox LAD to Mid LAD lesion is 90% stenosed. The lesion is eccentric.  Left Circumflex Vessel was injected. Vessel is normal in caliber. Vessel is angiographically normal.  Right Coronary Artery Vessel was injected. Vessel is large. Vessel is angiographically normal.  Intervention  No interventions have been documented.   CARDIAC CATHETERIZATION  CARDIAC CATHETERIZATION 02/13/2021  Narrative No angiographic evidence of CAD Moderate to severe aortic stenosis. (Peak to peak gradient 25 mmHg, mean gradient 27 mmHg, AVA 1.08 cm2). This could represent low flow, low gradient aortic stenosis given echo findings (low dimensionless index, low AVA, low SVI). The valve was easily crossed with a J wire. Normal right and left heart pressures  Recommendations: Will review with her primary cardiologist Dr. Bjorn Pippin. Planning for cardiac CT with TAVR protocol. I suspect that she may benefit from AVR or TAVR but will await CT findings.  Findings Coronary Findings Diagnostic  Dominance: Right  Left Anterior Descending Vessel is large.  Left Circumflex Vessel is large.  Right Coronary Artery Vessel is large.  Intervention  No interventions have been documented.     ECHOCARDIOGRAM  ECHOCARDIOGRAM COMPLETE 11/12/2022  Narrative ECHOCARDIOGRAM REPORT    Patient Name:   Ilhan Debenedetto Date of Exam: 11/12/2022 Medical Rec #:  [id number]          Height:       63.0 in Accession #:    [id number]         Weight:       153.0 lb Date of Birth:  13-Aug-1955          BSA:          1.726 m Patient Age:    67 years           BP:           100/68 mmHg Patient Gender: F                  HR:  59 bpm. Exam Location:  [address]  Procedure: 2D Echo, 3D Echo, Cardiac Doppler, Color Doppler and Strain Analysis  Indications:    I35.0 Aortic Stenosis  History:        Patient has prior history of Echocardiogram examinations, most recent 06/30/2022. Aortic Valve Disease, Signs/Symptoms:Dizziness/Lightheadedness and Shortness of Breath; Risk Factors:Family History of Coronary Artery Disease and Dyslipidemia. *New Symptoms (within last 2-3 months) Dizziness and Chest Pressure History of Right Breast Cancer (2004) status post Lumpectomy, Chemotherapy and Radiation.  Sonographer:    Farrel Conners RDCS Referring Phys: Bjorn Pippin, CHRISTOPHER, L  IMPRESSIONS   1. Severe AS (mean gradient 26 mmHg; AVA 0.79 cm2; DI 0.25). 2. Left ventricular ejection fraction, by estimation, is 55 to 60%. The left ventricle has normal function. The left ventricle has no regional wall motion abnormalities. There is mild left ventricular hypertrophy. Left ventricular diastolic parameters are consistent with Grade I diastolic dysfunction (impaired relaxation). The average left ventricular global longitudinal strain is -17.6 %. The global longitudinal strain is normal. 3. Right ventricular systolic function is normal. The right ventricular size is normal. 4. The mitral valve is normal in structure. Mild mitral valve regurgitation. No evidence of mitral stenosis. 5. The aortic valve is tricuspid. Aortic valve regurgitation is mild. Severe aortic valve stenosis. 6. The inferior vena cava is normal in size with greater than 50% respiratory variability, suggesting right atrial pressure of 3 mmHg.  Comparison(s): Aortic Stenosis, Prior mean gradient- .   FINDINGS Left Ventricle: Left ventricular ejection fraction, by estimation, is 55 to 60%. The left ventricle has normal function. The left ventricle has no regional wall motion abnormalities. The average left ventricular global longitudinal strain is -17.6 %. The global longitudinal  strain is normal. The left ventricular internal cavity size was normal in size. There is mild left ventricular hypertrophy. Left ventricular diastolic parameters are consistent with Grade I diastolic dysfunction (impaired relaxation).  Right Ventricle: The right ventricular size is normal. Right ventricular systolic function is normal.  Left Atrium: Left atrial size was normal in size.  Right Atrium: Right atrial size was normal in size.  Pericardium: There is no evidence of pericardial effusion.  Mitral Valve: The mitral valve is normal in structure. Mild mitral valve regurgitation. No evidence of mitral valve stenosis.  Tricuspid Valve: The tricuspid valve is normal in structure. Tricuspid valve regurgitation is mild . No evidence of tricuspid stenosis.  Aortic Valve: The aortic valve is tricuspid. Aortic valve regurgitation is mild. Aortic regurgitation PHT measures 579 msec. Severe aortic stenosis is present. Aortic valve mean gradient measures 26.5 mmHg. Aortic valve peak gradient measures 41.2 mmHg. Aortic valve area, by VTI measures 0.79 cm.  Pulmonic Valve: The pulmonic valve was normal in structure. Pulmonic valve regurgitation is trivial. No evidence of pulmonic stenosis.  Aorta: The aortic root is normal in size and structure.  Venous: The inferior vena cava is normal in size with greater than 50% respiratory variability, suggesting right atrial pressure of 3 mmHg.  IAS/Shunts: No atrial level shunt detected by color flow Doppler.  Additional Comments: Severe AS (mean gradient 26 mmHg; AVA 0.79 cm2; DI 0.25).   LEFT VENTRICLE PLAX 2D LVIDd:         3.50 cm   Diastology LVIDs:         1.90 cm   LV e' medial:    5.76 cm/s LV PW:         0.80 cm   LV E/e' medial:  14.2 LV IVS:  0.90 cm   LV e' lateral:   8.38 cm/s LVOT diam:     2.00 cm   LV E/e' lateral: 9.8 LV SV:         66 LV SV Index:   38        2D Longitudinal Strain LVOT Area:     3.14 cm  2D Strain GLS  (A2C):   -19.6 % 2D Strain GLS (A3C):   -23.2 % 2D Strain GLS (A4C):   -9.9 % 2D Strain GLS Avg:     -17.6 %  3D Volume EF: 3D EF:        68 % LV EDV:       120 ml LV ESV:       38 ml LV SV:        82 ml  RIGHT VENTRICLE RV Basal diam:  3.10 cm RV S prime:     9.14 cm/s TAPSE (M-mode): 1.4 cm RVSP:           23.2 mmHg  LEFT ATRIUM             Index        RIGHT ATRIUM           Index LA diam:        3.60 cm 2.09 cm/m   RA Pressure: 3.00 mmHg LA Vol (A2C):   55.5 ml 32.16 ml/m  RA Area:     11.30 cm LA Vol (A4C):   42.6 ml 24.69 ml/m  RA Volume:   26.30 ml  15.24 ml/m LA Biplane Vol: 50.1 ml 29.03 ml/m AORTIC VALVE AV Area (Vmax):    0.88 cm AV Area (Vmean):   0.84 cm AV Area (VTI):     0.79 cm AV Vmax:           321.00 cm/s AV Vmean:          226.800 cm/s AV VTI:            0.835 m AV Peak Grad:      41.2 mmHg AV Mean Grad:      26.5 mmHg LVOT Vmax:         89.96 cm/s LVOT Vmean:        60.520 cm/s LVOT VTI:          0.210 m LVOT/AV VTI ratio: 0.25 AI PHT:            579 msec  AORTA Ao Root diam: 2.70 cm Ao Asc diam:  3.40 cm  MITRAL VALVE                TRICUSPID VALVE MV Area (PHT)  cm          TR Peak grad:   20.2 mmHg MV Decel Time: 217 msec     TR Vmax:        225.00 cm/s MV E velocity: 82.00 cm/s   Estimated RAP:  3.00 mmHg MV A velocity: 105.50 cm/s  RVSP:           23.2 mmHg MV E/A ratio:  0.78 SHUNTS Systemic VTI:  0.21 m Systemic Diam: 2.00 cm  Olga Millers MD Electronically signed by Olga Millers MD Signature Date/Time: 11/12/2022/1:19:29 PM    Final   TEE  ECHO INTRAOPERATIVE TEE 05/06/2023  Narrative *INTRAOPERATIVE TRANSESOPHAGEAL REPORT *    Patient Name:   JANNELLE NOTARO Date of Exam: 05/06/2023 Medical Rec #:  [id number]          Height:  63.5 in Accession #:    [id number]         Weight:       160.9 lb Date of Birth:  1956/03/15          BSA:          1.77 m Patient Age:    67 years           BP:            125/67 mmHg Patient Gender: F                  HR:           80 bpm. Exam Location:  Anesthesiology  Transesophogeal exam was perform intraoperatively during surgical procedure. Patient was closely monitored under general anesthesia during the entirety of examination.  Indications:     CAD, AS Performing Phys: [id number] Eugenio Hoes  PROCEDURE: Intraoperative Transesophogeal TEE probe # [id number] used for procedure. Complications: No known complications during this procedure. POST-OP IMPRESSIONS s/p CABG x1, Aortic Valve Replacement _ Left Ventricle: The left ventricle is unchanged from pre-bypass. LV stable, no RWMA's. _ Right Ventricle: The right ventricle appears unchanged from pre-bypass. _ Aorta: The aorta appears unchanged from pre-bypass. No dissection noted after cannula removed. _ Left Atrium: The left atrium appears unchanged from pre-bypass. _ Left Atrial Appendage: The left atrial appendage appears unchanged from pre-bypass. _ Aortic Valve: s/p aortic valve replacement with 21mm inspiris resilia aortic valve, no leaking, no rocking, no AI, normal mean gradient. _ Mitral Valve: The mitral valve appears unchanged from pre-bypass. _ Tricuspid Valve: The tricuspid valve appears unchanged from pre-bypass. _ Pulmonic Valve: The pulmonic valve appears unchanged from pre-bypass. _ Interatrial Septum: The interatrial septum appears unchanged from pre-bypass. _ Interventricular Septum: The interventricular septum appears unchanged from pre-bypass. _ Pericardium: The pericardium appears unchanged from pre-bypass.  PRE-OP FINDINGS Left Ventricle: The left ventricle has low normal systolic function, with an ejection fraction of 50-55%. The cavity size was normal. No evidence of left ventricular regional wall motion abnormalities. There is mild concentric left ventricular hypertrophy. Left ventricular diastolic function was not evaluated.  Right Ventricle: The right ventricle has normal  systolic function. The cavity was normal. There is no increase in right ventricular wall thickness. Catheter present in the right ventricle. There is no aneurysm seen.  Left Atrium: Left atrial size was normal in size. No left atrial/left atrial appendage thrombus was detected. The left atrial appendage is well visualized and there is no evidence of thrombus present. Left atrial appendage velocity is normal at greater than 40 cm/s.  Right Atrium: Right atrial size was normal in size. Catheter present in the right atrium.  Interatrial Septum: No atrial level shunt detected by color flow Doppler. There is no evidence of a patent foramen ovale.  Pericardium: There is no evidence of pericardial effusion. There is no pleural effusion.  Mitral Valve: The mitral valve is normal in structure. Mitral valve regurgitation is mild by color flow Doppler. The MR jet is centrally-directed. There is no evidence of mitral valve vegetation. There is no evidence of mitral stenosis.  Tricuspid Valve: The tricuspid valve was normal in structure. Tricuspid valve regurgitation was not visualized by color flow Doppler. No evidence of tricuspid stenosis is present. There is no evidence of tricuspid valve vegetation.  Aortic Valve: The aortic valve is tricuspid. Aortic valve regurgitation is mild by color flow Doppler. The jet is centrally-directed. There is moderate to severe stenosis of the aortic  valve, with a mean gradient of . There is no evidence of aortic valve vegetation. There is severe thickening and moderate calcification present on the aortic valve right coronary and non-coronary cusps with mildly decreased mobility and there is severe thickening and moderate calcification present on the aortic valve left coronary cusp with severely decreased mobility.  Pulmonic Valve: The pulmonic valve was normal in structure. Pulmonic valve regurgitation is not visualized by color flow Doppler.   Aorta: The  ascending aorta, aortic root and aortic arch are normal in size and structure.  Pulmonary Artery: Theone Murdoch catheter present on the right. The pulmonary artery is of normal size.  Shunts: There is no evidence of an atrial septal defect.   Shona Simpson MD Electronically signed by Shona Simpson MD Signature Date/Time: 05/06/2023/2:55:02 PM    Final   MONITORS  LONG TERM MONITOR (3-14 DAYS) 01/30/2023  Narrative   2030 episodes of AV block (second-degree Mobitz 2, high-grade and third-degree).  Had third-degree AV block with heart rate down to 36 bpm   Patch Wear Time:  2 days and 21 hours (2024-06-24T20:25:46-0400 to 2024-06-27T17:30:28-0400)  Patient had a min HR of 26 bpm, max HR of 106 bpm, and avg HR of 42 bpm. Predominant underlying rhythm was Sinus Rhythm. Bundle Branch Block/IVCD was present. 2030 episode(s) of AV Block (2nd Mobitz II, High Grade and 3rd) occurred, lasting a total of 2 days 7 hours. AV Block (2nd Mobitz II and High Grade) and AV Block (3rd) were detected within +/- 45 seconds of symptomatic patient event(s). No Isolated SVEs, SVE Couplets, or SVE Triplets were present. Isolated VEs were rare (<1.0%), VE Couplets were rare (<1.0%), and no VE Triplets were present. Ventricular Bigeminy was present. MD notification criteria for Complete Heart Block and Symptomatic Second Degree AV Block, Mobitz II and High Grade AV Block met - report posted prior to notification per account request (TT).   CT SCANS  CT CORONARY MORPH W/CTA COR W/SCORE 02/25/2021  Addendum 02/25/2021  5:14 PM ADDENDUM REPORT: 02/25/2021 17:12  CLINICAL DATA:  Severe Aortic Stenosis.  EXAM: Cardiac TAVR CT  TECHNIQUE: A non-contrast, gated CT scan was obtained with axial slices of 3 mm through the heart for aortic valve calcium scoring. A 90 kV retrospective, gated, contrast cardiac scan was obtained. Gantry rotation speed was 250 msecs and collimation was 0.6 mm. Nitroglycerin was  not given. The 3D data set was reconstructed in 5% intervals of the 0-95% of the R-R cycle. Systolic and diastolic phases were analyzed on a dedicated workstation using MPR, MIP, and VRT modes. The patient received 100 cc of contrast.  FINDINGS: Image quality: Excellent.  Noise artifact is: Limited.  Valve Morphology: The aortic valve is tricuspid with partial fusion of the RCC/LCC. This possibly represents a forme fruste bicuspid valve. The leaflets are severely thickened with reduced opening in systole. There is mild to moderate calcification of the leaflets that is diffuse.  Aortic Valve Calcium score: 388  Phase assessed: 15%  Annular area: 354 mm2  Annular perimeter: 67.5 mm  Max diameter: 23.5 mm  Min diameter: 20.2 mm  Annular and subannular calcification: None.  Optimal coplanar projection: LAO 6 CAU 12  Coronary Artery Height above Annulus:  Left Main: 10.8 mm  Right Coronary: 12.8 mm  Sinus of Valsalva Measurements:  Non-coronary: 27 mm  Right-coronary: 26 mm  Left-coronary: 26 mm  Average: 26.7 mm  Sinus of Valsalva Height:  Non-coronary: 18.3 mm  Right-coronary: 16.5 mm  Left-coronary:  16.9 mm  Sinotubular Junction: 27 mm  Ascending Thoracic Aorta: 35 mm  Coronary Arteries: Normal coronary origin. Right dominance. The study was performed without use of NTG and is insufficient for plaque evaluation. Please refer to recent cardiac catheterization for coronary assessment.  Cardiac Morphology:  Right Atrium: Right atrial size is within normal limits.  Right Ventricle: The right ventricular cavity is within normal limits.  Left Atrium: Left atrial size is normal in size with no left atrial appendage filling defect.  Left Ventricle: The ventricular cavity size is within normal limits. There are no stigmata of prior infarction. There is no abnormal filling defect. Normal left ventricular function, LVEF=64%. No regional wall motion  abnormalities.  Pulmonary arteries: Normal in size without proximal filling defect.  Pulmonary veins: Normal pulmonary venous drainage.  Pericardium: Normal thickness with no significant effusion or calcium present.  Mitral Valve: The mitral valve is normal structure without significant calcification.  Extra-cardiac findings: See attached radiology report for non-cardiac structures.  IMPRESSION: 1. Tricuspid aortic valve with partial fusion of the RCC/LCC. This possibly represents a forme frust bicuspid aortic valve.  2. Annular measurements appropriate for 23 mm Edwards S3 TAVR (354 mm2).  3. No significant annular or subannular calcifications.  4. Shallow left main height (10.8 mm).  5. Optimal Fluoroscopic Angle for Delivery: LAO 6 CAU 12  [place] T. Flora Lipps, MD   Electronically Signed By: Lennie Odor On: 02/25/2021 17:12  Narrative EXAM: OVER-READ INTERPRETATION  CT CHEST  The following report is an over-read performed by radiologist Dr. Trudie Reed of [facility] on 02/25/2021. This over-read does not include interpretation of cardiac or coronary anatomy or pathology. The coronary calcium score/coronary CTA interpretation by the cardiologist is attached.  COMPARISON:  Cardiac CT 02/15/2021.  FINDINGS: Extracardiac findings will be described separately under dictation for contemporaneously obtained CTA chest, abdomen and pelvis.  IMPRESSION: Please see separate dictation for contemporaneously obtained CTA chest, abdomen and pelvis dated 02/25/2021 for full description of relevant extracardiac findings.  Electronically Signed: By: Trudie Reed M.D. On: 02/25/2021 11:12   CT SCANS  CT CARDIAC SCORING (SELF PAY ONLY) 01/16/2021  Addendum 01/16/2021 11:43 AM ADDENDUM REPORT: 01/16/2021 11:41  CLINICAL DATA:  Risk stratification:  Year-old 47 Caucasian Female  EXAM: Coronary Calcium Score  TECHNIQUE: The patient was scanned on  a [employer]. Axial non-contrast 3 mm slices were carried out through the heart. The data set was analyzed on a dedicated work station and scored using the Agatson method.  FINDINGS: Non-cardiac: See separate report from [facility].  Ascending Aorta: Normal caliber.  Aortic Valve: Non-severe aortic calcium with calcium score of 365  Pericardium: Normal.  Coronary arteries: Normal origins.  Coronary Calcium Score:  Left main: 0  Left anterior descending artery: 0  Left circumflex artery: 0  Right coronary artery: 0  Total: 0  Percentile: 1st for age, sex, and race matched control.  IMPRESSION: 1. Coronary calcium score of 0. This was 1st percentile for age, gender, and race matched controls.  2.  Non-severe range aortic calcium with calcium score of 365.  RECOMMENDATIONS:  The proposed cut-off value of 1,651 AU yielded a 93 % sensitivity and 75 % specificity in grading AS severity in patients with classical low-flow, low-gradient AS. Proposed different cut-off values to define severe AS for men and women as 2,065 AU and 1,274 AU, respectively. The joint European and American recommendations for the assessment of AS consider the aortic valve calcium score as  a continuum - a very high calcium score suggests severe AS and a low calcium score suggests severe AS is unlikely.  Sunday Shams, et al. 2017 ESC/EACTS Guidelines for the management of valvular heart disease. Eur Heart J 2017;38:2739-91.  Coronary artery calcium (CAC) score is a strong predictor of incident coronary heart disease (CHD) and provides predictive information beyond traditional risk factors. CAC scoring is reasonable to use in the decision to withhold, postpone, or initiate statin therapy in intermediate-risk or selected borderline-risk asymptomatic adults (age 13-75 years and LDL-C >=70 to <190 mg/dL) who do not have diabetes or established  atherosclerotic cardiovascular disease (ASCVD).* In intermediate-risk (10-year ASCVD risk >=7.5% to <20%) adults or selected borderline-risk (10-year ASCVD risk >=5% to <7.5%) adults in whom a CAC score is measured for the purpose of making a treatment decision the following recommendations have been made:  If CAC = 0, it is reasonable to withhold statin therapy and reassess in 5 to 10 years, as long as higher risk conditions are absent (diabetes mellitus, family history of premature CHD in first degree relatives (males <55 years; females <65 years), cigarette smoking, LDL >=190 mg/dL or other independent risk factors).  If CAC is 1 to 99, it is reasonable to initiate statin therapy for patients ?67 years of age.  If CAC is >=100 or >=75th percentile, it is reasonable to initiate statin therapy at any age.  Cardiology referral should be considered for patients with CAC scores =400 or >=75th percentile.  *2018 AHA/ACC/AACVPR/AAPA/ABC/ACPM/ADA/AGS/APhA/ASPC/NLA/PCNA Guideline on the Management of Blood Cholesterol: A Report of the American College of Cardiology/American Heart Association Task Force on Clinical Practice Guidelines. J Am Coll Cardiol. 2019;73(24):3168-3209.  Riley Lam, MD   Electronically Signed By: Riley Lam MD On: 01/16/2021 11:41  Narrative EXAM: OVER-READ INTERPRETATION  CT CHEST  The following report is an over-read performed by radiologist Dr. Irish Lack of [facility] on 01/16/2021. This over-read does not include interpretation of cardiac or coronary anatomy or pathology. The coronary calcium score interpretation by the cardiologist is attached.  COMPARISON:  None.  FINDINGS: Vascular: Calcifications are seen at the level of the aortic valve.  Mediastinum/Nodes: No lymphadenopathy identified in the visualized mediastinum or hilar regions. There is a small hiatal hernia.  Lungs/Pleura: Anterior subpleural  scarring and reticulation at the level of the inferior right upper lobe and right middle lobe likely relates to prior right breast radiation therapy. Visualized lungs show no evidence of pulmonary edema, consolidation, pneumothorax, nodule or pleural fluid.  Upper Abdomen: No acute abnormality.  Musculoskeletal: Clip or calcification seen in the right breast tissues. Bony structures are unremarkable.  IMPRESSION: 1. Calcifications of the aortic valve are consistent with valvular sclerosis/degenerative disease. 2. Small hiatal hernia. 3. Evidence of prior right breast surgery and radiation therapy.  Electronically Signed: By: Irish Lack M.D. On: 01/16/2021 09:14          EKG Interpretation Date/Time:  Wednesday May 27 2023 10:41:11 EST Ventricular Rate:  80 PR Interval:  212 QRS Duration:  122 QT Interval:  404 QTC Calculation: 465 R Axis:   116  Text Interpretation: AV dual-paced rhythm with prolonged AV conduction When compared with ECG of 22-May-2023 11:14, No significant change was found Confirmed by Micah Flesher ([id number]) on 05/27/2023 11:16:09 AM    Recent Labs: 05/04/2023: ALT 48 05/07/2023: Magnesium 2.0 05/09/2023: BUN 12; Creatinine, Ser 0.93; Potassium 3.5; Sodium 135 05/11/2023: Hemoglobin 7.9; Platelets 277  Recent Lipid Panel  Component Value Date/Time   CHOL 60 05/08/2023 0500   TRIG 92 05/08/2023 0500   HDL 27 (L) 05/08/2023 0500   CHOLHDL 2.2 05/08/2023 0500   VLDL 18 05/08/2023 0500   LDLCALC 15 05/08/2023 0500     Risk Assessment/Calculations:                Physical Exam:    VS:  BP 112/78   Pulse 80   Ht 5' 3.5" (1.613 m)   Wt 157 lb 3.2 oz (71.3 kg)   SpO2 97%   BMI 27.41 kg/m     Wt Readings from Last 3 Encounters:  05/27/23 157 lb 3.2 oz (71.3 kg)  05/22/23 157 lb 9.6 oz (71.5 kg)  05/19/23 155 lb (70.3 kg)     GEN:  Well nourished, well developed in no acute distress HEENT: Normal NECK: No JVD; No carotid  bruits LYMPHATICS: No lymphadenopathy CARDIAC: RRR, no murmurs, rubs, gallops RESPIRATORY:  Clear to auscultation without rales, wheezing or rhonchi  ABDOMEN: Soft, non-tender, non-distended MUSCULOSKELETAL:  No edema; No deformity  SKIN: Warm and dry NEUROLOGIC:  Alert and oriented x 3 PSYCHIATRIC:  Normal affect  Sternotomy C/D/I  ASSESSMENT:    1. Nonrheumatic aortic valve stenosis   2. Heart block AV second degree   3. S/P CABG x 1   4. S/P AVR   5. Pacemaker - MDT   6. Hyperlipidemia with target LDL less than 70    PLAN:    In order of problems listed above:  CAD s/p CABG x 1 with LIMA-LAD - on 325 mg ASA, amiodarone 200 mg daily for afib prophylaxis - continue 12.5 mg lopressor BID - should reduce to 81 mg ASA - she will discuss this with Dr. Leafy Ro   AS s/p AVR - follow up echo pending   Hyperlipidemia with LDL goal < 70 05/08/2023: Cholesterol 60; HDL 27; LDL Cholesterol 15; Triglycerides 92; VLDL 18 Continue 10 mg crestor Consider LP(a) with next blood draw   CHB now s/p PPM - following with Dr. Graciela Husbands   Keep follow up with Dr. Bjorn Pippin as scheduled.      Cardiac Rehabilitation Eligibility Assessment  The patient is ready to start cardiac rehabilitation pending clearance from the cardiac surgeon.          Medication Adjustments/Labs and Tests Ordered: Current medicines are reviewed at length with the patient today.  Concerns regarding medicines are outlined above.  Orders Placed This Encounter  Procedures   EKG 12-Lead   No orders of the defined types were placed in this encounter.   Patient Instructions  Medication Instructions:  Your physician recommends that you continue on your current medications as directed. Please refer to the Current Medication list given to you today.  *If you need a refill on your cardiac medications before your next appointment, please call your pharmacy*   Lab Work: NONE ordered at this time of appointment     Testing/Procedures: NONE ordered at this time of appointment     Follow-Up: At [facility], you and your health needs are our priority.  As part of our continuing mission to provide you with exceptional heart care, we have created designated Provider Care Teams.  These Care Teams include your primary Cardiologist (physician) and Advanced Practice Providers (APPs -  Physician Assistants and Nurse Practitioners) who all work together to provide you with the care you need, when you need it.  We recommend signing up for the patient portal called "  MyChart".  Sign up information is provided on this After Visit Summary.  MyChart is used to connect with patients for Virtual Visits (Telemedicine).  Patients are able to view lab/test results, encounter notes, upcoming appointments, etc.  Non-urgent messages can be sent to your provider as well.   To learn more about what you can do with MyChart, go to [email or web address].    Your next appointment:    Keep follow up   Provider:   Little Ishikawa, MD          Signed, [name], [place]  05/27/2023 11:54 AM    [facility]

## 2023-05-26 ENCOUNTER — Ambulatory Visit: Payer: Medicare Other

## 2023-05-27 ENCOUNTER — Ambulatory Visit: Payer: Medicare Other | Attending: Physician Assistant | Admitting: Physician Assistant

## 2023-05-27 ENCOUNTER — Encounter: Payer: Self-pay | Admitting: Physician Assistant

## 2023-05-27 VITALS — BP 112/78 | HR 80 | Ht 63.5 in | Wt 157.2 lb

## 2023-05-27 DIAGNOSIS — I441 Atrioventricular block, second degree: Secondary | ICD-10-CM | POA: Diagnosis not present

## 2023-05-27 DIAGNOSIS — Z952 Presence of prosthetic heart valve: Secondary | ICD-10-CM | POA: Diagnosis not present

## 2023-05-27 DIAGNOSIS — E785 Hyperlipidemia, unspecified: Secondary | ICD-10-CM

## 2023-05-27 DIAGNOSIS — Z951 Presence of aortocoronary bypass graft: Secondary | ICD-10-CM

## 2023-05-27 DIAGNOSIS — I350 Nonrheumatic aortic (valve) stenosis: Secondary | ICD-10-CM

## 2023-05-27 DIAGNOSIS — Z950 Presence of cardiac pacemaker: Secondary | ICD-10-CM

## 2023-05-27 NOTE — Patient Instructions (Signed)
Medication Instructions:  Your physician recommends that you continue on your current medications as directed. Please refer to the Current Medication list given to you today.  *If you need a refill on your cardiac medications before your next appointment, please call your pharmacy*   Lab Work: NONE ordered at this time of appointment    Testing/Procedures: NONE ordered at this time of appointment     Follow-Up: At [facility], you and your health needs are our priority.  As part of our continuing mission to provide you with exceptional heart care, we have created designated Provider Care Teams.  These Care Teams include your primary Cardiologist (physician) and Advanced Practice Providers (APPs -  Physician Assistants and Nurse Practitioners) who all work together to provide you with the care you need, when you need it.  We recommend signing up for the patient portal called "MyChart".  Sign up information is provided on this After Visit Summary.  MyChart is used to connect with patients for Virtual Visits (Telemedicine).  Patients are able to view lab/test results, encounter notes, upcoming appointments, etc.  Non-urgent messages can be sent to your provider as well.   To learn more about what you can do with MyChart, go to [email or web address].    Your next appointment:    Keep follow up   Provider:   Little Ishikawa, MD

## 2023-06-03 NOTE — Progress Notes (Unsigned)
[address]       [place] [address]             [phone]           [name] Medical Record #[id number] Date of Birth: 12/08/1955  [name], MD  Chief Complaint:  follow up avr/cabg   History of Present Illness:     Pt now 4 weeks post above surgery. She is doing well. Is walking 30 min a day outside. No SOB. Some sternal discomfort but overall improving. Has seen cardiology and plan for echo in place. Has heard from cardiac rehab.      Past Medical History:  Diagnosis Date   Anxiety    Aortic valve stenosis    Arthritis    Breast cancer (HCC) 2004   right breast; ER/PR+, Her2+   Cancer (HCC) 10/2002   right breast lumpectomy   Coronary artery disease    Dysrhythmia    2nd Degree AV block   GERD (gastroesophageal reflux disease)    Heart murmur    History of kidney stones    HX   HTN (hypertension)    Hyperlipidemia    Morton neuroma    Personal history of chemotherapy 11/19/2002   Personal history of radiation therapy 02/19/2003   Pneumonia    Pre-diabetes    Presence of permanent cardiac pacemaker 01/21/2023   [employer]    Past Surgical History:  Procedure Laterality Date   AORTIC VALVE REPLACEMENT N/A 05/06/2023   Procedure: AORTIC VALVE REPLACEMENT (AVR) USING 21 MM INSPIRIS RESILIA AORTIC VALVE;  Surgeon: Eugenio Hoes, MD;  Location: [facility] OR;  Service: Open Heart Surgery;  Laterality: N/A;   BREAST BIOPSY Right 2019   BREAST LUMPECTOMY Right    BREAST SURGERY  10/2002   lumpectomy / right side   COLONOSCOPY  2016   CORONARY ARTERY BYPASS GRAFT N/A 05/06/2023   Procedure: CORONARY ARTERY BYPASS GRAFTING (CABG) X ONE USING LEFT INTERNAL MAMMARY ARTERY;  Surgeon: Eugenio Hoes, MD;  Location: [facility] OR;  Service: Open Heart Surgery;  Laterality: N/A;   PACEMAKER IMPLANT N/A 01/21/2023   Procedure: PACEMAKER IMPLANT;  Surgeon: Duke Salvia, MD;  Location: [facility] INVASIVE CV LAB;   Service: Cardiovascular;  Laterality: N/A;   RIGHT/LEFT HEART CATH AND CORONARY ANGIOGRAPHY N/A 02/13/2021   Procedure: RIGHT/LEFT HEART CATH AND CORONARY ANGIOGRAPHY;  Surgeon: Kathleene Hazel, MD;  Location: [facility] INVASIVE CV LAB;  Service: Cardiovascular;  Laterality: N/A;   RIGHT/LEFT HEART CATH AND CORONARY ANGIOGRAPHY N/A 04/14/2023   Procedure: RIGHT/LEFT HEART CATH AND CORONARY ANGIOGRAPHY;  Surgeon: [place], Peter M, MD;  Location: [facility] INVASIVE CV LAB;  Service: Cardiovascular;  Laterality: N/A;   TEE WITHOUT CARDIOVERSION N/A 05/06/2023   Procedure: TRANSESOPHAGEAL ECHOCARDIOGRAM;  Surgeon: Eugenio Hoes, MD;  Location: [facility] OR;  Service: Open Heart Surgery;  Laterality: N/A;    Social History   Tobacco Use  Smoking Status Never  Smokeless Tobacco Never    Social History   Substance and Sexual Activity  Alcohol Use Not Currently   Alcohol/week: 3.0 standard drinks of alcohol   Types: 3 Standard drinks or equivalent per week    Social History   Socioeconomic History   Marital status: Divorced    Spouse name: Not on file   Number of children: 2   Years of education: Not on file   Highest education level: Not on file  Occupational History   Occupation: Retired-Customer  Service manager  Tobacco Use   Smoking status: Never   Smokeless tobacco: Never  Vaping Use   Vaping status: Never Used  Substance and Sexual Activity   Alcohol use: Not Currently    Alcohol/week: 3.0 standard drinks of alcohol    Types: 3 Standard drinks or equivalent per week   Drug use: No   Sexual activity: Yes  Other Topics Concern   Not on file  Social History Narrative   Not on file   Social Determinants of Health   Financial Resource Strain: Not on file  Food Insecurity: Not on file  Transportation Needs: Not on file  Physical Activity: Not on file  Stress: Not on file (09/01/2019)  Social Connections: Not on file  Intimate Partner Violence: Low Risk  (05/10/2020)   Received from  [facility]   Intimate Partner Violence    Insults You: Not on file    Threatens You: Not on file    Screams at You: Not on file    Physically Hurt: Not on file    Intimate Partner Violence Score: Not on file    Allergies  Allergen Reactions   Wound Dressing Adhesive     Blisters from bandages    Current Outpatient Medications  Medication Sig Dispense Refill   acetaminophen (TYLENOL) 650 MG CR tablet Take 650 mg by mouth every 8 (eight) hours as needed for pain.     amiodarone (PACERONE) 200 MG tablet Take 200 mg bid for one week then take 200 mg daily thereafter 60 tablet 1   aspirin EC 325 MG tablet Take 1 tablet (325 mg total) by mouth daily.     Calcium Carbonate (CALCI-CHEW PO) Take 1 tablet by mouth in the morning.     calcium carbonate (TUMS EX) 750 MG chewable tablet Chew 2 tablets by mouth daily as needed for heartburn.     cholecalciferol (VITAMIN D3) 25 MCG (1000 UNIT) tablet Take 1,000 Units by mouth daily.     escitalopram (LEXAPRO) 10 MG tablet Take 10 mg by mouth daily.     ferrous sulfate 325 (65 FE) MG EC tablet Take 1 tablet (325 mg total) by mouth daily with breakfast. For one month then stop. If develops constipation, may take laxative or stop iron. (Patient not taking: Reported on 05/27/2023)     metoprolol tartrate (LOPRESSOR) 25 MG tablet Take 0.5 tablets (12.5 mg total) by mouth 2 (two) times daily. 60 tablet 1   Polyethyl Glycol-Propyl Glycol (LUBRICATING EYE DROPS OP) Place 1 drop into both eyes daily as needed (dry eyes).     rosuvastatin (CRESTOR) 10 MG tablet Take 10 mg by mouth daily.     traMADol (ULTRAM) 50 MG tablet Take 1 tablet (50 mg total) by mouth every 6 (six) hours as needed for moderate pain (pain score 4-6). 28 tablet 0   No current facility-administered medications for this visit.     Family History  Problem Relation Age of Onset   Heart disease Mother    Diabetes Mother    Kidney failure Mother    Skin cancer Father         on ear; unknown type   Lung cancer Father 56       smoker; metastasis to bone and liver   Diabetes Brother    Heart disease Brother    Kidney failure Brother    Diabetes Maternal Aunt    Heart disease Maternal Aunt    Heart Problems Maternal Uncle  Diabetes Maternal Uncle    Skin cancer Paternal Uncle        on nose; unknown type   Heart attack Maternal Grandfather    Heart attack Paternal Grandmother    Heart attack Paternal Grandfather    Bladder Cancer Brother        dx. late 26s; smoker   Alzheimer's disease Paternal Aunt    Heart disease Paternal Aunt    Alzheimer's disease Paternal Uncle    Breast cancer Neg Hx        Physical Exam: Incision intact with eschar in place inferiorly . No drainage.  Card: RR without murmur Ext: no edema Lungs; Clear      Diagnostic Studies & Laboratory data: I have personally reviewed the following studies and agree with the findings     Recent Radiology Findings:       Recent Lab Findings: Lab Results  Component Value Date   WBC 6.8 05/11/2023   HGB 7.9 (L) 05/11/2023   HCT 24.6 (L) 05/11/2023   PLT 277 05/11/2023   GLUCOSE 115 (H) 05/09/2023   CHOL 60 05/08/2023   TRIG 92 05/08/2023   HDL 27 (L) 05/08/2023   LDLCALC 15 05/08/2023   ALT 48 (H) 05/04/2023   AST 41 05/04/2023   NA 135 05/09/2023   K 3.5 05/09/2023   CL 100 05/09/2023   CREATININE 0.93 05/09/2023   BUN 12 05/09/2023   CO2 27 05/09/2023   INR 1.6 (H) 05/06/2023   HGBA1C 5.7 (H) 05/04/2023      Assessment / Plan:     Doing well. May attend cardiac rehab. Restrictions reviewed with no lifting over 10lbs for two more weeks then may advance. May drive now. Will monitor lower sternal incision scab. Stop amiodarone and asa 81mg  moving forward. Follow up prn   I have spent 30 min in review of the records, viewing studies and in face to face with patient and in coordination of future care    Eugenio Hoes 06/03/2023 12:25 PM

## 2023-06-04 ENCOUNTER — Encounter: Payer: Self-pay | Admitting: Thoracic Surgery (Cardiothoracic Vascular Surgery)

## 2023-06-04 ENCOUNTER — Ambulatory Visit (INDEPENDENT_AMBULATORY_CARE_PROVIDER_SITE_OTHER): Payer: Self-pay | Admitting: Thoracic Surgery (Cardiothoracic Vascular Surgery)

## 2023-06-04 VITALS — BP 119/74 | HR 84 | Resp 20 | Ht 63.5 in | Wt 157.0 lb

## 2023-06-04 DIAGNOSIS — Z952 Presence of prosthetic heart valve: Secondary | ICD-10-CM

## 2023-06-04 MED ORDER — ASPIRIN 81 MG PO TBEC: 81.0000 mg | DELAYED_RELEASE_TABLET | Freq: Every day | ORAL | 2 refills | Status: AC

## 2023-06-08 ENCOUNTER — Telehealth (HOSPITAL_COMMUNITY): Payer: Self-pay

## 2023-06-08 ENCOUNTER — Encounter (HOSPITAL_COMMUNITY): Payer: Self-pay

## 2023-06-08 NOTE — Telephone Encounter (Signed)
Pt called in regards to her cardiac rehab referral. She is very interested in the program and would like to get scheduled as soon as possible.

## 2023-06-08 NOTE — Telephone Encounter (Signed)
Called patient to see if he was interested in participating in the Cardiac Rehab Program. Patient stated yes. Patient will come in for orientation on 11/20@1030  and will attend the 1015 exercise class.   Sent welcome letter to [employer].

## 2023-06-10 ENCOUNTER — Encounter (HOSPITAL_COMMUNITY)
Admission: RE | Admit: 2023-06-10 | Discharge: 2023-06-10 | Disposition: A | Discharge status: Home / Self Care | Payer: Medicare Other | Source: Ambulatory Visit | Attending: Cardiology | Admitting: Cardiology

## 2023-06-10 VITALS — BP 108/66 | HR 80 | Ht 63.5 in | Wt 160.7 lb

## 2023-06-10 DIAGNOSIS — Z950 Presence of cardiac pacemaker: Secondary | ICD-10-CM | POA: Diagnosis not present

## 2023-06-10 DIAGNOSIS — Z951 Presence of aortocoronary bypass graft: Secondary | ICD-10-CM | POA: Diagnosis not present

## 2023-06-10 DIAGNOSIS — I083 Combined rheumatic disorders of mitral, aortic and tricuspid valves: Secondary | ICD-10-CM | POA: Diagnosis not present

## 2023-06-10 DIAGNOSIS — Z952 Presence of prosthetic heart valve: Secondary | ICD-10-CM | POA: Insufficient documentation

## 2023-06-10 DIAGNOSIS — Z853 Personal history of malignant neoplasm of breast: Secondary | ICD-10-CM | POA: Insufficient documentation

## 2023-06-10 NOTE — Progress Notes (Signed)
Cardiac Individual Treatment Plan  Patient Details  Name: [name] MRN: [id number] Date of Birth: 1956/03/16 Referring Provider:   Flowsheet Row INTENSIVE CARDIAC REHAB ORIENT from 06/10/2023 in [facility]  Referring Provider Epifanio Lesches, MD       Initial Encounter Date:  Flowsheet Row INTENSIVE CARDIAC REHAB ORIENT from 06/10/2023 in [facility]  Date 06/10/23       Visit Diagnosis: 05/06/23 S/P CABG x 1  05/06/23 S/P aortic valve replacement  Patient's Home Medications on Admission:  Current Outpatient Medications:    acetaminophen (TYLENOL) 650 MG CR tablet, Take 650 mg by mouth every 8 (eight) hours as needed for pain., Disp: , Rfl:    aspirin EC 81 MG tablet, Take 1 tablet (81 mg total) by mouth daily. Swallow whole., Disp: 150 tablet, Rfl: 2   Calcium Carbonate (CALCI-CHEW PO), Take 1 tablet by mouth in the morning., Disp: , Rfl:    calcium carbonate (TUMS EX) 750 MG chewable tablet, Chew 2 tablets by mouth daily as needed for heartburn., Disp: , Rfl:    cholecalciferol (VITAMIN D3) 25 MCG (1000 UNIT) tablet, Take 1,000 Units by mouth daily., Disp: , Rfl:    escitalopram (LEXAPRO) 10 MG tablet, Take 10 mg by mouth daily., Disp: , Rfl:    metoprolol tartrate (LOPRESSOR) 25 MG tablet, Take 0.5 tablets (12.5 mg total) by mouth 2 (two) times daily., Disp: 60 tablet, Rfl: 1   Polyethyl Glycol-Propyl Glycol (LUBRICATING EYE DROPS OP), Place 1 drop into both eyes daily as needed (dry eyes)., Disp: , Rfl:    rosuvastatin (CRESTOR) 10 MG tablet, Take 10 mg by mouth daily., Disp: , Rfl:    traMADol (ULTRAM) 50 MG tablet, Take 1 tablet (50 mg total) by mouth every 6 (six) hours as needed for moderate pain (pain score 4-6)., Disp: 28 tablet, Rfl: 0   ferrous sulfate 325 (65 FE) MG EC tablet, Take 1 tablet (325 mg total) by mouth daily with breakfast.  For one month then stop. If develops constipation, may take laxative or stop iron., Disp: , Rfl:   Past Medical History: Past Medical History:  Diagnosis Date   Anxiety    Aortic valve stenosis    Arthritis    Breast cancer (HCC) 2004   right breast; ER/PR+, Her2+   Cancer (HCC) 10/2002   right breast lumpectomy   Coronary artery disease    Dysrhythmia    2nd Degree AV block   GERD (gastroesophageal reflux disease)    Heart murmur    History of kidney stones    HX   HTN (hypertension)    Hyperlipidemia    Morton neuroma    Personal history of chemotherapy 11/19/2002   Personal history of radiation therapy 02/19/2003   Pneumonia    Pre-diabetes    Presence of permanent cardiac pacemaker 01/21/2023   [employer]tronic    Tobacco Use: Social History   Tobacco Use  Smoking Status Never  Smokeless Tobacco Never    Labs: Review Flowsheet  More data may exist      Latest Ref Rng & Units 02/13/2021 04/14/2023 05/04/2023 05/06/2023 05/08/2023  Labs for ITP Cardiac and Pulmonary Rehab  Cholestrol 0 - 200 mg/dL - - - - 60   LDL (calc) 0 - 99 mg/dL - - - - 15   HDL-C >65 mg/dL - - - - 27   Trlycerides <150 mg/dL - - - -  92   Hemoglobin A1c 4.8 - 5.6 % - - 5.7  - -  PH, Arterial 7.35 - 7.45 7.379  7.323  7.43  7.365  7.285  7.275  7.337  7.375  -  PCO2 arterial 32 - 48 mmHg 41.7  45.9  36  44.7  41.3  46.4  38.1  34.8  -  Bicarbonate 20.0 - 28.0 mmol/L 25.8  24.6  24.3  24.1  23.8  23.9  25.5  19.7  21.8  20.4  21.9  20.4  -  TCO2 22 - 32 mmol/L 27  26  26  25  25   - 27  21  23  20  22  22  23  21  22   -  Acid-base deficit 0.0 - 2.0 mmol/L 1.0  2.0  2.0  2.0  0.1  7.0  5.0  5.0  3.0  4.0  -  O2 Saturation % 76.0  99.0  69  72  98  95.7  98  99  98  92  74  100  -    Details       Multiple values from one day are sorted in reverse-chronological order         Capillary Blood Glucose: Lab Results  Component Value Date   GLUCAP 116 (H) 05/11/2023   GLUCAP 123 (H) 05/10/2023    GLUCAP 87 05/10/2023   GLUCAP 92 05/10/2023   GLUCAP 125 (H) 05/09/2023     Exercise Target Goals: Exercise Program Goal: Individual exercise prescription set using results from initial 6 min walk test and THRR while considering  patient's activity barriers and safety.   Exercise Prescription Goal: Initial exercise prescription builds to 30-45 minutes a day of aerobic activity, 2-3 days per week.  Home exercise guidelines will be given to patient during program as part of exercise prescription that the participant will acknowledge.  Activity Barriers & Risk Stratification:  Activity Barriers & Cardiac Risk Stratification - 06/10/23 1108       Activity Barriers & Cardiac Risk Stratification   Activity Barriers Arthritis;Back Problems;Incisional Pain    Cardiac Risk Stratification High   <5 METs on            6 Minute Walk:  6 Minute Walk     Row Name 06/10/23 1208         6 Minute Walk   Phase Initial     Distance 1310 feet     Walk Time 6 minutes     # of Rest Breaks 0     MPH 2.48     METS 2.93     RPE 10     Perceived Dyspnea  0     VO2 Peak 10.26     Symptoms No     Resting HR 80 bpm     Resting BP 108/66     Resting Oxygen Saturation  94 %     Exercise Oxygen Saturation  during 6 min walk 95 %     Max Ex. HR 101 bpm     Max Ex. BP 122/68     2 Minute Post BP 112/70              Oxygen Initial Assessment:   Oxygen Re-Evaluation:   Oxygen Discharge (Final Oxygen Re-Evaluation):   Initial Exercise Prescription:  Initial Exercise Prescription - 06/10/23 1200       Date of Initial Exercise RX and Referring Provider   Date 06/10/23  Referring Provider Epifanio Lesches, MD    Expected Discharge Date 09/02/23      Treadmill   MPH 2    Grade 0    Minutes 15    METs 2.5      Recumbant Bike   Level 1    RPM 60    Watts 18    Minutes 15    METs 2.5      Prescription Details   Frequency (times per week) 3    Duration  Progress to 30 minutes of continuous aerobic without signs/symptoms of physical distress      Intensity   THRR 40-80% of Max Heartrate 61-122    Ratings of Perceived Exertion 11-13    Perceived Dyspnea 0-4      Progression   Progression Continue progressive overload as per policy without signs/symptoms or physical distress.      Resistance Training   Training Prescription Yes    Weight 2    Reps 10-15             Perform Capillary Blood Glucose checks as needed.  Exercise Prescription Changes:   Exercise Comments:   Exercise Goals and Review:   Exercise Goals     Row Name 06/10/23 1109             Exercise Goals   Increase Physical Activity Yes       Intervention Provide advice, education, support and counseling about physical activity/exercise needs.;Develop an individualized exercise prescription for aerobic and resistive training based on initial evaluation findings, risk stratification, comorbidities and participant's personal goals.       Expected Outcomes Short Term: Attend rehab on a regular basis to increase amount of physical activity.;Long Term: Add in home exercise to make exercise part of routine and to increase amount of physical activity.;Long Term: Exercising regularly at least 3-5 days a week.       Increase Strength and Stamina Yes       Intervention Provide advice, education, support and counseling about physical activity/exercise needs.;Develop an individualized exercise prescription for aerobic and resistive training based on initial evaluation findings, risk stratification, comorbidities and participant's personal goals.       Expected Outcomes Short Term: Perform resistance training exercises routinely during rehab and add in resistance training at home;Short Term: Increase workloads from initial exercise prescription for resistance, speed, and METs.;Long Term: Improve cardiorespiratory fitness, muscular endurance and strength as measured by  increased METs and functional capacity ( )       Able to understand and use rate of perceived exertion (RPE) scale Yes       Intervention Provide education and explanation on how to use RPE scale       Expected Outcomes Short Term: Able to use RPE daily in rehab to express subjective intensity level;Long Term:  Able to use RPE to guide intensity level when exercising independently       Knowledge and understanding of Target Heart Rate Range (THRR) Yes       Intervention Provide education and explanation of THRR including how the numbers were predicted and where they are located for reference       Expected Outcomes Short Term: Able to state/look up THRR;Short Term: Able to use daily as guideline for intensity in rehab;Long Term: Able to use THRR to govern intensity when exercising independently       Understanding of Exercise Prescription Yes       Intervention Provide education, explanation, and written materials on patient's individual  exercise prescription       Expected Outcomes Short Term: Able to explain program exercise prescription;Long Term: Able to explain home exercise prescription to exercise independently                Exercise Goals Re-Evaluation :   Discharge Exercise Prescription (Final Exercise Prescription Changes):   Nutrition:  Target Goals: Understanding of nutrition guidelines, daily intake of sodium 1500mg , cholesterol 200mg , calories 30% from fat and 7% or less from saturated fats, daily to have 5 or more servings of fruits and vegetables.  Biometrics:  Pre Biometrics - 06/10/23 1107       Pre Biometrics   Waist Circumference 34.5 inches    Hip Circumference 38.5 inches    Waist to Hip Ratio 0.9 %    Triceps Skinfold 35 mm    % Body Fat 40.2 %    Grip Strength 24 kg    Flexibility 14 in    Single Leg Stand 30 seconds              Nutrition Therapy Plan and Nutrition Goals:   Nutrition Assessments:  MEDIFICTS Score Key: >=70 Need to  make dietary changes  40-70 Heart Healthy Diet <= 40 Therapeutic Level Cholesterol Diet    Picture Your Plate Scores: <29 Unhealthy dietary pattern with much room for improvement. 41-50 Dietary pattern unlikely to meet recommendations for good health and room for improvement. 51-60 More healthful dietary pattern, with some room for improvement.  >60 Healthy dietary pattern, although there may be some specific behaviors that could be improved.    Nutrition Goals Re-Evaluation:   Nutrition Goals Re-Evaluation:   Nutrition Goals Discharge (Final Nutrition Goals Re-Evaluation):   Psychosocial: Target Goals: Acknowledge presence or absence of significant depression and/or stress, maximize coping skills, provide positive support system. Participant is able to verbalize types and ability to use techniques and skills needed for reducing stress and depression.  Initial Review & Psychosocial Screening:  Initial Psych Review & Screening - 06/10/23 1110       Initial Review   Current issues with Current Psychotropic Meds      Family Dynamics   Good Support System? Yes   Johari has her son and daughter for support   Comments Greisy takes Lexapro and feels it is working for her. She denies any current feelings of anxiety/depression/stress.      Barriers   Psychosocial barriers to participate in program There are no identifiable barriers or psychosocial needs.      Screening Interventions   Interventions Encouraged to exercise;Provide feedback about the scores to participant    Expected Outcomes Short Term goal: Identification and review with participant of any Quality of Life or Depression concerns found by scoring the questionnaire.;Long Term goal: The participant improves quality of Life and PHQ9 Scores as seen by post scores and/or verbalization of changes             Quality of Life Scores:  Quality of Life - 06/10/23 1124       Quality of Life   Select Quality of Life       Quality of Life Scores   Health/Function Pre 25 %    Socioeconomic Pre 30 %    Psych/Spiritual Pre 27.5 %    Family Pre 27 %    GLOBAL Pre 26.79 %            Scores of 19 and below usually indicate a poorer quality of life in these areas.  A difference of  2-3 points is a clinically meaningful difference.  A difference of 2-3 points in the total score of the Quality of Life Index has been associated with significant improvement in overall quality of life, self-image, physical symptoms, and general health in studies assessing change in quality of life.  PHQ-9: Review Flowsheet       06/10/2023 01/30/2014  Depression screen PHQ 2/9  Decreased Interest 0 -  Down, Depressed, Hopeless 0 0  PHQ - 2 Score 0 0  Altered sleeping 2 -  Tired, decreased energy 0 -  Change in appetite 0 -  Feeling bad or failure about yourself  0 -  Trouble concentrating 0 -  Moving slowly or fidgety/restless 0 -  Suicidal thoughts 0 -  PHQ-9 Score 2 -  Difficult doing work/chores Not difficult at all -    Details           Interpretation of Total Score  Total Score Depression Severity:  1-4 = Minimal depression, 5-9 = Mild depression, 10-14 = Moderate depression, 15-19 = Moderately severe depression, 20-27 = Severe depression   Psychosocial Evaluation and Intervention:   Psychosocial Re-Evaluation:   Psychosocial Discharge (Final Psychosocial Re-Evaluation):   Vocational Rehabilitation: Provide vocational rehab assistance to qualifying candidates.   Vocational Rehab Evaluation & Intervention:  Vocational Rehab - 06/10/23 1115       Initial Vocational Rehab Evaluation & Intervention   Assessment shows need for Vocational Rehabilitation No   Miyah is retired            Education: Education Goals: Education classes will be provided on a weekly basis, covering required topics. Participant will state understanding/return demonstration of topics presented.     Core  Videos: Exercise    Move It!  Clinical staff conducted group or individual video education with verbal and written material and guidebook.  Patient learns the recommended Pritikin exercise program. Exercise with the goal of living a long, healthy life. Some of the health benefits of exercise include controlled diabetes, healthier blood pressure levels, improved cholesterol levels, improved heart and lung capacity, improved sleep, and better body composition. Everyone should speak with their doctor before starting or changing an exercise routine.  Biomechanical Limitations Clinical staff conducted group or individual video education with verbal and written material and guidebook.  Patient learns how biomechanical limitations can impact exercise and how we can mitigate and possibly overcome limitations to have an impactful and balanced exercise routine.  Body Composition Clinical staff conducted group or individual video education with verbal and written material and guidebook.  Patient learns that body composition (ratio of muscle mass to fat mass) is a key component to assessing overall fitness, rather than body weight alone. Increased fat mass, especially visceral belly fat, can put [name] at increased risk for metabolic syndrome, type 2 diabetes, heart disease, and even death. It is recommended to combine diet and exercise (cardiovascular and resistance training) to improve your body composition. Seek guidance from your physician and exercise physiologist before implementing an exercise routine.  Exercise Action Plan Clinical staff conducted group or individual video education with verbal and written material and guidebook.  Patient learns the recommended strategies to achieve and enjoy long-term exercise adherence, including variety, self-motivation, self-efficacy, and positive decision making. Benefits of exercise include fitness, good health, weight management, more energy, better sleep, less  stress, and overall well-being.  Medical   Heart Disease Risk Reduction Clinical staff conducted group or individual video education with verbal and written  material and guidebook.  Patient learns our heart is our most vital organ as it circulates oxygen, nutrients, white blood cells, and hormones throughout the entire body, and carries waste away. Data supports a plant-based eating plan like the Pritikin Program for its effectiveness in slowing progression of and reversing heart disease. The video provides a number of recommendations to address heart disease.   Metabolic Syndrome and Belly Fat  Clinical staff conducted group or individual video education with verbal and written material and guidebook.  Patient learns what metabolic syndrome is, how it leads to heart disease, and how one can reverse it and keep it from coming back. You have metabolic syndrome if you have 3 of the following 5 criteria: abdominal obesity, high blood pressure, high triglycerides, low HDL cholesterol, and high blood sugar.  Hypertension and Heart Disease Clinical staff conducted group or individual video education with verbal and written material and guidebook.  Patient learns that high blood pressure, or hypertension, is very common in the [place]. Hypertension is largely due to excessive salt intake, but other important risk factors include being overweight, physical inactivity, drinking too much alcohol, smoking, and not eating enough potassium from fruits and vegetables. High blood pressure is a leading risk factor for heart attack, stroke, congestive heart failure, dementia, kidney failure, and premature death. Long-term effects of excessive salt intake include stiffening of the arteries and thickening of heart muscle and organ damage. Recommendations include ways to reduce hypertension and the risk of heart disease.  Diseases of Our Time - Focusing on Diabetes Clinical staff conducted group or individual  video education with verbal and written material and guidebook.  Patient learns why the best way to stop diseases of our time is prevention, through food and other lifestyle changes. Medicine (such as prescription pills and surgeries) is often only a Band-Aid on the problem, not a long-term solution. Most common diseases of our time include obesity, type 2 diabetes, hypertension, heart disease, and cancer. The Pritikin Program is recommended and has been proven to help reduce, reverse, and/or prevent the damaging effects of metabolic syndrome.  Nutrition   Overview of the Pritikin Eating Plan  Clinical staff conducted group or individual video education with verbal and written material and guidebook.  Patient learns about the Pritikin Eating Plan for disease risk reduction. The Pritikin Eating Plan emphasizes a wide variety of unrefined, minimally-processed carbohydrates, like fruits, vegetables, whole grains, and legumes. Go, Caution, and Stop food choices are explained. Plant-based and lean animal proteins are emphasized. Rationale provided for low sodium intake for blood pressure control, low added sugars for blood sugar stabilization, and low added fats and oils for coronary artery disease risk reduction and weight management.  Calorie Density  Clinical staff conducted group or individual video education with verbal and written material and guidebook.  Patient learns about calorie density and how it impacts the Pritikin Eating Plan. Knowing the characteristics of the food you choose will help you decide whether those foods will lead to weight gain or weight loss, and whether you want to consume more or less of them. Weight loss is usually a side effect of the Pritikin Eating Plan because of its focus on low calorie-dense foods.  Label Reading  Clinical staff conducted group or individual video education with verbal and written material and guidebook.  Patient learns about the Pritikin recommended  label reading guidelines and corresponding recommendations regarding calorie density, added sugars, sodium content, and whole grains.  Dining Out - Part 1  Clinical staff conducted group or individual video education with verbal and written material and guidebook.  Patient learns that restaurant meals can be sabotaging because they can be so high in calories, fat, sodium, and/or sugar. Patient learns recommended strategies on how to positively address this and avoid unhealthy pitfalls.  Facts on Fats  Clinical staff conducted group or individual video education with verbal and written material and guidebook.  Patient learns that lifestyle modifications can be just as effective, if not more so, as many medications for lowering your risk of heart disease. A Pritikin lifestyle can help to reduce your risk of inflammation and atherosclerosis (cholesterol build-up, or plaque, in the artery walls). Lifestyle interventions such as dietary choices and physical activity address the cause of atherosclerosis. A review of the types of fats and their impact on blood cholesterol levels, along with dietary recommendations to reduce fat intake is also included.  Nutrition Action Plan  Clinical staff conducted group or individual video education with verbal and written material and guidebook.  Patient learns how to incorporate Pritikin recommendations into their lifestyle. Recommendations include planning and keeping personal health goals in mind as an important part of their success.  Healthy Mind-Set    Healthy Minds, Bodies, Hearts  Clinical staff conducted group or individual video education with verbal and written material and guidebook.  Patient learns how to identify when they are stressed. Video will discuss the impact of that stress, as well as the many benefits of stress management. Patient will also be introduced to stress management techniques. The way we think, act, and feel has an impact on our  hearts.  How Our Thoughts Can Heal Our Hearts  Clinical staff conducted group or individual video education with verbal and written material and guidebook.  Patient learns that negative thoughts can cause depression and anxiety. This can result in negative lifestyle behavior and serious health problems. Cognitive behavioral therapy is an effective method to help control our thoughts in order to change and improve our emotional outlook.  Additional Videos:  Exercise    Improving Performance  Clinical staff conducted group or individual video education with verbal and written material and guidebook.  Patient learns to use a non-linear approach by alternating intensity levels and lengths of time spent exercising to help burn more calories and lose more body fat. Cardiovascular exercise helps improve heart health, metabolism, hormonal balance, blood sugar control, and recovery from fatigue. Resistance training improves strength, endurance, balance, coordination, reaction time, metabolism, and muscle mass. Flexibility exercise improves circulation, posture, and balance. Seek guidance from your physician and exercise physiologist before implementing an exercise routine and learn your capabilities and proper form for all exercise.  Introduction to Yoga  Clinical staff conducted group or individual video education with verbal and written material and guidebook.  Patient learns about yoga, a discipline of the coming together of mind, breath, and body. The benefits of yoga include improved flexibility, improved range of motion, better posture and core strength, increased lung function, weight loss, and positive self-image. Yoga's heart health benefits include lowered blood pressure, healthier heart rate, decreased cholesterol and triglyceride levels, improved immune function, and reduced stress. Seek guidance from your physician and exercise physiologist before implementing an exercise routine and learn your  capabilities and proper form for all exercise.  Medical   Aging: Enhancing Your Quality of Life  Clinical staff conducted group or individual video education with verbal and written material and guidebook.  Patient learns key strategies and recommendations to stay  in good physical health and enhance quality of life, such as prevention strategies, having an advocate, securing a Health Care Proxy and Power of Attorney, and keeping a list of medications and system for tracking them. It also discusses how to avoid risk for bone loss.  Biology of Weight Control  Clinical staff conducted group or individual video education with verbal and written material and guidebook.  Patient learns that weight gain occurs because we consume more calories than we burn (eating more, moving less). Even if your body weight is normal, you may have higher ratios of fat compared to muscle mass. Too much body fat puts you at increased risk for cardiovascular disease, heart attack, stroke, type 2 diabetes, and obesity-related cancers. In addition to exercise, following the Pritikin Eating Plan can help reduce your risk.  Decoding Lab Results  Clinical staff conducted group or individual video education with verbal and written material and guidebook.  Patient learns that lab test reflects one measurement whose values change over time and are influenced by many factors, including medication, stress, sleep, exercise, food, hydration, pre-existing medical conditions, and more. It is recommended to use the knowledge from this video to become more involved with your lab results and evaluate your numbers to speak with your doctor.   Diseases of Our Time - Overview  Clinical staff conducted group or individual video education with verbal and written material and guidebook.  Patient learns that according to the CDC, 50% to 70% of chronic diseases (such as obesity, type 2 diabetes, elevated lipids, hypertension, and heart disease) are  avoidable through lifestyle improvements including healthier food choices, listening to satiety cues, and increased physical activity.  Sleep Disorders Clinical staff conducted group or individual video education with verbal and written material and guidebook.  Patient learns how good quality and duration of sleep are important to overall health and well-being. Patient also learns about sleep disorders and how they impact health along with recommendations to address them, including discussing with a physician.  Nutrition  Dining Out - Part 2 Clinical staff conducted group or individual video education with verbal and written material and guidebook.  Patient learns how to plan ahead and communicate in order to maximize their dining experience in a healthy and nutritious manner. Included are recommended food choices based on the type of restaurant the patient is visiting.   Fueling a Banker conducted group or individual video education with verbal and written material and guidebook.  There is a strong connection between our food choices and our health. Diseases like obesity and type 2 diabetes are very prevalent and are in large-part due to lifestyle choices. The Pritikin Eating Plan provides plenty of food and hunger-curbing satisfaction. It is easy to follow, affordable, and helps reduce health risks.  Menu Workshop  Clinical staff conducted group or individual video education with verbal and written material and guidebook.  Patient learns that restaurant meals can sabotage health goals because they are often packed with calories, fat, sodium, and sugar. Recommendations include strategies to plan ahead and to communicate with the manager, chef, or server to help order a healthier meal.  Planning Your Eating Strategy  Clinical staff conducted group or individual video education with verbal and written material and guidebook.  Patient learns about the Pritikin Eating Plan and  its benefit of reducing the risk of disease. The Pritikin Eating Plan does not focus on calories. Instead, it emphasizes high-quality, nutrient-rich foods. By knowing the characteristics of the foods,  we choose, we can determine their calorie density and make informed decisions.  Targeting Your Nutrition Priorities  Clinical staff conducted group or individual video education with verbal and written material and guidebook.  Patient learns that lifestyle habits have a tremendous impact on disease risk and progression. This video provides eating and physical activity recommendations based on your personal health goals, such as reducing LDL cholesterol, losing weight, preventing or controlling type 2 diabetes, and reducing high blood pressure.  Vitamins and Minerals  Clinical staff conducted group or individual video education with verbal and written material and guidebook.  Patient learns different ways to obtain key vitamins and minerals, including through a recommended healthy diet. It is important to discuss all supplements you take with your doctor.   Healthy Mind-Set    Smoking Cessation  Clinical staff conducted group or individual video education with verbal and written material and guidebook.  Patient learns that cigarette smoking and tobacco addiction pose a serious health risk which affects millions of people. Stopping smoking will significantly reduce the risk of heart disease, lung disease, and many forms of cancer. Recommended strategies for quitting are covered, including working with your doctor to develop a successful plan.  Culinary   Becoming a Set designer conducted group or individual video education with verbal and written material and guidebook.  Patient learns that cooking at home can be healthy, cost-effective, quick, and puts them in control. Keys to cooking healthy recipes will include looking at your recipe, assessing your equipment needs, planning ahead,  making it simple, choosing cost-effective seasonal ingredients, and limiting the use of added fats, salts, and sugars.  Cooking - Breakfast and Snacks  Clinical staff conducted group or individual video education with verbal and written material and guidebook.  Patient learns how important breakfast is to satiety and nutrition through the entire day. Recommendations include key foods to eat during breakfast to help stabilize blood sugar levels and to prevent overeating at meals later in the day. Planning ahead is also a key component.  Cooking - Educational psychologist conducted group or individual video education with verbal and written material and guidebook.  Patient learns eating strategies to improve overall health, including an approach to cook more at home. Recommendations include thinking of animal protein as a side on your plate rather than center stage and focusing instead on lower calorie dense options like vegetables, fruits, whole grains, and plant-based proteins, such as beans. Making sauces in large quantities to freeze for later and leaving the skin on your vegetables are also recommended to maximize your experience.  Cooking - Healthy Salads and Dressing Clinical staff conducted group or individual video education with verbal and written material and guidebook.  Patient learns that vegetables, fruits, whole grains, and legumes are the foundations of the Pritikin Eating Plan. Recommendations include how to incorporate each of these in flavorful and healthy salads, and how to create homemade salad dressings. Proper handling of ingredients is also covered. Cooking - Soups and [employer] - Soups and Desserts Clinical staff conducted group or individual video education with verbal and written material and guidebook.  Patient learns that Pritikin soups and desserts make for easy, nutritious, and delicious snacks and meal components that are low in sodium, fat, sugar, and  calorie density, while high in vitamins, minerals, and filling fiber. Recommendations include simple and healthy ideas for soups and desserts.   Overview     The Pritikin Solution Program Overview Clinical  staff conducted group or individual video education with verbal and written material and guidebook.  Patient learns that the results of the Pritikin Program have been documented in more than 100 articles published in peer-reviewed journals, and the benefits include reducing risk factors for (and, in some cases, even reversing) high cholesterol, high blood pressure, type 2 diabetes, obesity, and more! An overview of the three key pillars of the Pritikin Program will be covered: eating well, doing regular exercise, and having a healthy mind-set.  WORKSHOPS  Exercise: Exercise Basics: Building Your Action Plan Clinical staff led group instruction and group discussion with PowerPoint presentation and patient guidebook. To enhance the learning environment the use of posters, models and videos may be added. At the conclusion of this workshop, patients will comprehend the difference between physical activity and exercise, as well as the benefits of incorporating both, into their routine. Patients will understand the FITT (Frequency, Intensity, Time, and Type) principle and how to use it to build an exercise action plan. In addition, safety concerns and other considerations for exercise and cardiac rehab will be addressed by the presenter. The purpose of this lesson is to promote a comprehensive and effective weekly exercise routine in order to improve patients' overall level of fitness.   Managing Heart Disease: Your Path to a Healthier Heart Clinical staff led group instruction and group discussion with PowerPoint presentation and patient guidebook. To enhance the learning environment the use of posters, models and videos may be added.At the conclusion of this workshop, patients will understand the  anatomy and physiology of the heart. Additionally, they will understand how Pritikin's three pillars impact the risk factors, the progression, and the management of heart disease.  The purpose of this lesson is to provide a high-level overview of the heart, heart disease, and how the Pritikin lifestyle positively impacts risk factors.  Exercise Biomechanics Clinical staff led group instruction and group discussion with PowerPoint presentation and patient guidebook. To enhance the learning environment the use of posters, models and videos may be added. Patients will learn how the structural parts of their bodies function and how these functions impact their daily activities, movement, and exercise. Patients will learn how to promote a neutral spine, learn how to manage pain, and identify ways to improve their physical movement in order to promote healthy living. The purpose of this lesson is to expose patients to common physical limitations that impact physical activity. Participants will learn practical ways to adapt and manage aches and pains, and to minimize their effect on regular exercise. Patients will learn how to maintain good posture while sitting, walking, and lifting.  Balance Training and Fall Prevention  Clinical staff led group instruction and group discussion with PowerPoint presentation and patient guidebook. To enhance the learning environment the use of posters, models and videos may be added. At the conclusion of this workshop, patients will understand the importance of their sensorimotor skills (vision, proprioception, and the vestibular system) in maintaining their ability to balance as they age. Patients will apply a variety of balancing exercises that are appropriate for their current level of function. Patients will understand the common causes for poor balance, possible solutions to these problems, and ways to modify their physical environment in order to minimize their  fall risk. The purpose of this lesson is to teach patients about the importance of maintaining balance as they age and ways to minimize their risk of falling.  WORKSHOPS   Nutrition:  Fueling a Ship broker led  group instruction and group discussion with PowerPoint presentation and patient guidebook. To enhance the learning environment the use of posters, models and videos may be added. Patients will review the foundational principles of the Pritikin Eating Plan and understand what constitutes a serving size in each of the food groups. Patients will also learn Pritikin-friendly foods that are better choices when away from home and review make-ahead meal and snack options. Calorie density will be reviewed and applied to three nutrition priorities: weight maintenance, weight loss, and weight gain. The purpose of this lesson is to reinforce (in a group setting) the key concepts around what patients are recommended to eat and how to apply these guidelines when away from home by planning and selecting Pritikin-friendly options. Patients will understand how calorie density may be adjusted for different weight management goals.  Mindful Eating  Clinical staff led group instruction and group discussion with PowerPoint presentation and patient guidebook. To enhance the learning environment the use of posters, models and videos may be added. Patients will briefly review the concepts of the Pritikin Eating Plan and the importance of low-calorie dense foods. The concept of mindful eating will be introduced as well as the importance of paying attention to internal hunger signals. Triggers for non-hunger eating and techniques for dealing with triggers will be explored. The purpose of this lesson is to provide patients with the opportunity to review the basic principles of the Pritikin Eating Plan, discuss the value of eating mindfully and how to measure internal cues of hunger and fullness using the  Hunger Scale. Patients will also discuss reasons for non-hunger eating and learn strategies to use for controlling emotional eating.  Targeting Your Nutrition Priorities Clinical staff led group instruction and group discussion with PowerPoint presentation and patient guidebook. To enhance the learning environment the use of posters, models and videos may be added. Patients will learn how to determine their genetic susceptibility to disease by reviewing their family history. Patients will gain insight into the importance of diet as part of an overall healthy lifestyle in mitigating the impact of genetics and other environmental insults. The purpose of this lesson is to provide patients with the opportunity to assess their personal nutrition priorities by looking at their family history, their own health history and current risk factors. Patients will also be able to discuss ways of prioritizing and modifying the Pritikin Eating Plan for their highest risk areas  Menu  Clinical staff led group instruction and group discussion with PowerPoint presentation and patient guidebook. To enhance the learning environment the use of posters, models and videos may be added. Using menus brought in from [employer], or printed from [employer], patients will apply the Pritikin dining out guidelines that were presented in the [employer] video. Patients will also be able to practice these guidelines in a variety of provided scenarios. The purpose of this lesson is to provide patients with the opportunity to practice hands-on learning of the Pritikin Dining Out guidelines with actual menus and practice scenarios.  Label Reading Clinical staff led group instruction and group discussion with PowerPoint presentation and patient guidebook. To enhance the learning environment the use of posters, models and videos may be added. Patients will review and discuss the Pritikin label reading guidelines  presented in Pritikin's Label Reading Educational series video. Using fool labels brought in from local grocery stores and markets, patients will apply the label reading guidelines and determine if the packaged food meet the Pritikin guidelines. The purpose  of this lesson is to provide patients with the opportunity to review, discuss, and practice hands-on learning of the Pritikin Label Reading guidelines with actual packaged food labels. Cooking School  Pritikin's [employer] are designed to teach patients ways to prepare quick, simple, and affordable recipes at home. The importance of nutrition's role in chronic disease risk reduction is reflected in its emphasis in the overall Pritikin program. By learning how to prepare essential core Pritikin Eating Plan recipes, patients will increase control over what they eat; be able to customize the flavor of foods without the use of added salt, sugar, or fat; and improve the quality of the food they consume. By learning a set of core recipes which are easily assembled, quickly prepared, and affordable, patients are more likely to prepare more healthy foods at home. These workshops focus on convenient breakfasts, simple entres, side dishes, and desserts which can be prepared with minimal effort and are consistent with nutrition recommendations for cardiovascular risk reduction. Cooking [employer] are taught by a Armed forces logistics/support/administrative officer (RD) who has been trained by the [employer]. The chef or RD has a clear understanding of the importance of minimizing - if not completely eliminating - added fat, sugar, and sodium in recipes. Throughout the series of Cooking School Workshop sessions, patients will learn about healthy ingredients and efficient methods of cooking to build confidence in their capability to prepare    Cooking School weekly topics:  Adding Flavor- Sodium-Free  Fast and Healthy Breakfasts  Powerhouse Plant-Based  Proteins  Satisfying Salads and Dressings  Simple Sides and Sauces  International Cuisine-Spotlight on the [employer] Zones  Delicious Desserts  Savory Soups  [employer] - Meals in a Astronomer Appetizers and Snacks  Comforting Weekend Breakfasts  One-Pot Wonders   Fast Evening Meals  Landscape architect Your Pritikin Plate  WORKSHOPS   Healthy Mindset (Psychosocial):  Focused Goals, Sustainable Changes Clinical staff led group instruction and group discussion with PowerPoint presentation and patient guidebook. To enhance the learning environment the use of posters, models and videos may be added. Patients will be able to apply effective goal setting strategies to establish at least one personal goal, and then take consistent, meaningful action toward that goal. They will learn to identify common barriers to achieving personal goals and develop strategies to overcome them. Patients will also gain an understanding of how our mind-set can impact our ability to achieve goals and the importance of cultivating a positive and growth-oriented mind-set. The purpose of this lesson is to provide patients with a deeper understanding of how to set and achieve personal goals, as well as the tools and strategies needed to overcome common obstacles which may arise along the way.  From Head to Heart: The Power of a Healthy Outlook  Clinical staff led group instruction and group discussion with PowerPoint presentation and patient guidebook. To enhance the learning environment the use of posters, models and videos may be added. Patients will be able to recognize and describe the impact of emotions and mood on physical health. They will discover the importance of self-care and explore self-care practices which may work for them. Patients will also learn how to utilize the 4 C's to cultivate a healthier outlook and better manage stress and challenges. The purpose of this lesson is to demonstrate  to patients how a healthy outlook is an essential part of maintaining good health, especially as they continue their cardiac rehab journey.  Healthy  Sleep for a Healthy Heart Clinical staff led group instruction and group discussion with PowerPoint presentation and patient guidebook. To enhance the learning environment the use of posters, models and videos may be added. At the conclusion of this workshop, patients will be able to demonstrate knowledge of the importance of sleep to overall health, well-being, and quality of life. They will understand the symptoms of, and treatments for, common sleep disorders. Patients will also be able to identify daytime and nighttime behaviors which impact sleep, and they will be able to apply these tools to help manage sleep-related challenges. The purpose of this lesson is to provide patients with a general overview of sleep and outline the importance of quality sleep. Patients will learn about a few of the most common sleep disorders. Patients will also be introduced to the concept of "sleep hygiene," and discover ways to self-manage certain sleeping problems through simple daily behavior changes. Finally, the workshop will motivate patients by clarifying the links between quality sleep and their goals of heart-healthy living.   Recognizing and Reducing Stress Clinical staff led group instruction and group discussion with PowerPoint presentation and patient guidebook. To enhance the learning environment the use of posters, models and videos may be added. At the conclusion of this workshop, patients will be able to understand the types of stress reactions, differentiate between acute and chronic stress, and recognize the impact that chronic stress has on their health. They will also be able to apply different coping mechanisms, such as reframing negative self-talk. Patients will have the opportunity to practice a variety of stress management techniques, such as deep  abdominal breathing, progressive muscle relaxation, and/or guided imagery.  The purpose of this lesson is to educate patients on the role of stress in their lives and to provide healthy techniques for coping with it.  Learning Barriers/Preferences:  Learning Barriers/Preferences - 06/10/23 1115       Learning Barriers/Preferences   Learning Barriers Sight   contacts/glasses   Learning Preferences Audio;Computer/Internet;Group Instruction;Individual Instruction;Pictoral;Skilled Demonstration;Verbal Instruction;Video;Written Material             Education Topics:  Knowledge Questionnaire Score:  Knowledge Questionnaire Score - 06/10/23 1115       Knowledge Questionnaire Score   Pre Score 22/24             Core Components/Risk Factors/Patient Goals at Admission:  Personal Goals and Risk Factors at Admission - 06/10/23 1115       Core Components/Risk Factors/Patient Goals on Admission    Weight Management Yes;Weight Loss    Intervention Weight Management: Develop a combined nutrition and exercise program designed to reach desired caloric intake, while maintaining appropriate intake of nutrient and fiber, sodium and fats, and appropriate energy expenditure required for the weight goal.;Weight Management: Provide education and appropriate resources to help participant work on and attain dietary goals.    Expected Outcomes Long Term: Adherence to nutrition and physical activity/exercise program aimed toward attainment of established weight goal;Short Term: Continue to assess and modify interventions until short term weight is achieved;Weight Loss: Understanding of general recommendations for a balanced deficit meal plan, which promotes 1-2 lb weight loss per week and includes a negative energy balance of [phone] kcal/d;Understanding recommendations for meals to include 15-35% energy as protein, 25-35% energy from fat, 35-60% energy from carbohydrates, less than 200mg  of dietary  cholesterol, 20-35 gm of total fiber daily;Understanding of distribution of calorie intake throughout the day with the consumption of 4-5 meals/snacks    Hypertension Yes  Intervention Provide education on lifestyle modifcations including regular physical activity/exercise, weight management, moderate sodium restriction and increased consumption of fresh fruit, vegetables, and low fat dairy, alcohol moderation, and smoking cessation.;Monitor prescription use compliance.    Expected Outcomes Short Term: Continued assessment and intervention until BP is < 140/44mm HG in hypertensive participants. < 130/67mm HG in hypertensive participants with diabetes, heart failure or chronic kidney disease.;Long Term: Maintenance of blood pressure at goal levels.    Lipids Yes    Intervention Provide education and support for participant on nutrition & aerobic/resistive exercise along with prescribed medications to achieve LDL 70mg , HDL >40mg .    Expected Outcomes Short Term: Participant states understanding of desired cholesterol values and is compliant with medications prescribed. Participant is following exercise prescription and nutrition guidelines.;Long Term: Cholesterol controlled with medications as prescribed, with individualized exercise RX and with personalized nutrition plan. Value goals: LDL < 70mg , HDL > 40 mg.    Stress Yes    Intervention Offer individual and/or small group education and counseling on adjustment to heart disease, stress management and health-related lifestyle change. Teach and support self-help strategies.;Refer participants experiencing significant psychosocial distress to appropriate mental health specialists for further evaluation and treatment. When possible, include family members and significant others in education/counseling sessions.    Expected Outcomes Short Term: Participant demonstrates changes in health-related behavior, relaxation and other stress management skills, ability  to obtain effective social support, and compliance with psychotropic medications if prescribed.;Long Term: Emotional wellbeing is indicated by absence of clinically significant psychosocial distress or social isolation.             Core Components/Risk Factors/Patient Goals Review:    Core Components/Risk Factors/Patient Goals at Discharge (Final Review):    ITP Comments:  ITP Comments     Row Name 06/10/23 1106           ITP Comments Dr. Armanda Magic medical director. Introduction to [facility] education/intensive cardiac rehab. Initial orientation packet reviewed with patient.                Comments:Participant attended orientation for the cardiac rehabilitation program on  06/10/2023  to perform initial intake and exercise walk test. Patient introduced to the [facility] education and orientation packet was reviewed. Completed 6-minute walk test, measurements, initial ITP, and exercise prescription. Vital signs stable. Telemetry AV-Paced NSR, asymptomatic.   Service time was from 1026 to 1158.  Jonna Coup, MS, ACSM-CEP 06/10/2023 12:11 PM

## 2023-06-10 NOTE — Progress Notes (Signed)
Cardiac Rehab Medication Review   Does the patient  feel that his/her medications are working for him/her? YES  Has the patient been experiencing any side effects to the medications prescribed?  NO  Does the patient measure his/her own blood pressure or blood glucose at home?   NO   Does the patient have any problems obtaining medications due to transportation or finances?  NO  Understanding of regimen: excellent Understanding of indications: excellent Potential of compliance: excellent    Comments: Gettie understands her medications and regime well. She has a BP cuff but does not check her BP regularly.     Jonna Coup, MS, ACSM-CEP 06/10/2023 11:16 AM

## 2023-06-11 ENCOUNTER — Telehealth: Payer: Self-pay

## 2023-06-11 NOTE — Telephone Encounter (Signed)
..     Pre-operative Risk Assessment    Patient Name: [name]  DOB: August 20, 1955 MRN: [id number]     Request for Surgical Clearance    Procedure:   dental cleaning and xray  Date of Surgery:  Clearance TBD                                 Surgeon:  dr Lorin Picket tucker Surgeon's Group or Practice Name:  [facility] Phone number:  [phone] Fax number:  [phone]   Type of Clearance Requested:   - Medical  - Pharmacy:  Hold Aspirin     Type of Anesthesia:  Not Indicated   Additional requests/questions:   last o/v 05/27/23, no f/u appt  Signed, Renee Ramus   06/11/2023, 12:55 PM

## 2023-06-12 ENCOUNTER — Other Ambulatory Visit: Payer: Self-pay

## 2023-06-12 ENCOUNTER — Encounter: Payer: Self-pay | Admitting: Cardiology

## 2023-06-12 MED ORDER — AMOXICILLIN 500 MG PO TABS
2000.0000 mg | ORAL_TABLET | Freq: Once | ORAL | 0 refills | Status: AC
Start: 2023-06-12 — End: 2023-06-12

## 2023-06-12 NOTE — Progress Notes (Signed)
Called patient to confirm she can tolerate amoxicillin.  She states she has taken it in the past without issues.  New script sent to pharm for 500 mg tabs take 4 tablets one hour before dental work per protocol for heart valve surgical patients.

## 2023-06-15 ENCOUNTER — Encounter (HOSPITAL_COMMUNITY)
Admission: RE | Admit: 2023-06-15 | Discharge: 2023-06-15 | Disposition: A | Discharge status: Home / Self Care | Payer: Medicare Other | Source: Ambulatory Visit | Attending: Cardiology | Admitting: Cardiology

## 2023-06-15 ENCOUNTER — Other Ambulatory Visit (HOSPITAL_COMMUNITY): Payer: Self-pay

## 2023-06-15 DIAGNOSIS — Z952 Presence of prosthetic heart valve: Secondary | ICD-10-CM | POA: Diagnosis not present

## 2023-06-15 DIAGNOSIS — I083 Combined rheumatic disorders of mitral, aortic and tricuspid valves: Secondary | ICD-10-CM | POA: Diagnosis not present

## 2023-06-15 DIAGNOSIS — Z950 Presence of cardiac pacemaker: Secondary | ICD-10-CM | POA: Diagnosis not present

## 2023-06-15 DIAGNOSIS — Z951 Presence of aortocoronary bypass graft: Secondary | ICD-10-CM | POA: Diagnosis not present

## 2023-06-15 DIAGNOSIS — Z853 Personal history of malignant neoplasm of breast: Secondary | ICD-10-CM | POA: Diagnosis not present

## 2023-06-15 MED ORDER — AMOXICILLIN 500 MG PO CAPS
2000.0000 mg | ORAL_CAPSULE | Freq: Once | ORAL | 0 refills | Status: AC
  Filled 2023-06-15: qty 4, 1d supply, fill #0

## 2023-06-15 NOTE — Telephone Encounter (Signed)
    Primary Cardiologist: Little Ishikawa, MD  Chart reviewed as part of pre-operative protocol coverage. Simple dental extractions are considered low risk procedures per guidelines and generally do not require any specific cardiac clearance. It is also generally accepted that for simple extractions and dental cleanings, there is no need to interrupt blood thinner therapy.   SBE prophylaxis is required for the patient.  Patient will take amoxicillin 2 g an hour before dental cleaning.  Prescription has been sent in.  I will route this recommendation to the requesting party via Epic fax function and remove from pre-op pool.  Please call with questions.  Sharlene Dory, PA-C 06/15/2023, 7:16 AM

## 2023-06-15 NOTE — Progress Notes (Signed)
Daily Session Note  Patient Details  Name: [name] MRN: [id number] Date of Birth: March 30, 1956 Referring Provider:   Flowsheet Row INTENSIVE CARDIAC REHAB ORIENT from 06/10/2023 in [facility]  Referring Provider Epifanio Lesches, MD       Encounter Date: 06/15/2023  Check In:  Session Check In - 06/15/23 1001       Check-In   Supervising physician immediately available to respond to emergencies [facility] MD immediately available    Physician(s) Jari Favre, PA-C    Location [facility]    Staff Present Lorin Picket, MS, ACSM-CEP, CCRP, Exercise Physiologist;Olinty Peggye Pitt, MS, ACSM-CEP, Exercise Physiologist;Johnny Hale Bogus, MS, Exercise Physiologist;Jetta Dan Humphreys BS, ACSM-CEP, Exercise Physiologist;Ferman Basilio, RN, BSN    Virtual Visit No    Medication changes reported     No    Fall or balance concerns reported    No    Tobacco Cessation No Change    Warm-up and Cool-down Performed as group-led instruction    Resistance Training Performed Yes    VAD Patient? No    PAD/SET Patient? No      Pain Assessment   Currently in Pain? No/denies    Pain Score 0-No pain    Multiple Pain Sites No             Capillary Blood Glucose: No results found for this or any previous visit (from the past 24 hour(s)).   Exercise Prescription Changes - 06/15/23 1017       Response to Exercise   Blood Pressure (Admit) 142/68    Blood Pressure (Exercise) 122/58    Blood Pressure (Exit) 120/68    Heart Rate (Admit) 79 bpm    Heart Rate (Exercise) 115 bpm    Heart Rate (Exit) 89 bpm    Rating of Perceived Exertion (Exercise) 11    Symptoms None    Comments Off to a good start with exercise.    Duration Continue with 30 min of aerobic exercise without signs/symptoms of physical distress.    Intensity THRR unchanged      Progression   Progression Continue to progress workloads to maintain  intensity without signs/symptoms of physical distress.    Average METs 2.4      Resistance Training   Training Prescription Yes    Weight 2    Reps 10-15    Time 10 Minutes      Interval Training   Interval Training No      Treadmill   MPH 2    Grade 0    Minutes 15    METs 2.53      Recumbant Bike   Level 1    RPM 74    Watts 18    Minutes 15    METs 2.3             Social History   Tobacco Use  Smoking Status Never  Smokeless Tobacco Never    Goals Met:  Exercise tolerated well No report of concerns or symptoms today Strength training completed today  Goals Unmet:  Not Applicable  Comments: Pt started cardiac rehab today.  Pt tolerated light exercise without difficulty. VSS, telemetry-Sinus Rhythm, asymptomatic.  Medication list reconciled. Pt denies barriers to medicaiton compliance.  PSYCHOSOCIAL ASSESSMENT:  PHQ-0. Pt exhibits positive coping skills, hopeful outlook with supportive family. No psychosocial needs identified at this time, no psychosocial interventions necessary.    Pt enjoys hiking, reading, gardening and going  to wineries.   Pt oriented to exercise equipment and routine.    Understanding verbalized. Thayer Headings RN BSN    Dr. Armanda Magic is Medical Director for Cardiac Rehab at [facility].

## 2023-06-16 NOTE — Telephone Encounter (Signed)
I spoke to patient, it is a routine dental cleaning, will hold off for now

## 2023-06-16 NOTE — Telephone Encounter (Signed)
Patient is only 1 month out from her AVR, it is recommended to postpone routine dental cleanings for 3 to 6 months after valve surgery.  Would recommend holding off on dental work for now

## 2023-06-17 ENCOUNTER — Ambulatory Visit (HOSPITAL_BASED_OUTPATIENT_CLINIC_OR_DEPARTMENT_OTHER): Payer: Medicare Other

## 2023-06-17 ENCOUNTER — Telehealth (HOSPITAL_COMMUNITY): Payer: Self-pay

## 2023-06-17 ENCOUNTER — Encounter (HOSPITAL_COMMUNITY)
Admission: RE | Admit: 2023-06-17 | Discharge: 2023-06-17 | Disposition: A | Discharge status: Home / Self Care | Payer: Medicare Other | Source: Ambulatory Visit | Attending: Cardiology | Admitting: Cardiology

## 2023-06-17 DIAGNOSIS — Z951 Presence of aortocoronary bypass graft: Secondary | ICD-10-CM | POA: Diagnosis not present

## 2023-06-17 DIAGNOSIS — I350 Nonrheumatic aortic (valve) stenosis: Secondary | ICD-10-CM

## 2023-06-17 DIAGNOSIS — Z952 Presence of prosthetic heart valve: Secondary | ICD-10-CM | POA: Diagnosis not present

## 2023-06-17 DIAGNOSIS — Z853 Personal history of malignant neoplasm of breast: Secondary | ICD-10-CM | POA: Diagnosis not present

## 2023-06-17 DIAGNOSIS — I083 Combined rheumatic disorders of mitral, aortic and tricuspid valves: Secondary | ICD-10-CM | POA: Diagnosis not present

## 2023-06-17 DIAGNOSIS — Z950 Presence of cardiac pacemaker: Secondary | ICD-10-CM | POA: Diagnosis not present

## 2023-06-17 LAB — ECHOCARDIOGRAM COMPLETE
AV Mean grad: 8.5 mm[Hg]
AV Peak grad: 14.4 mm[Hg]
Ao pk vel: 1.9 m/s
Area-P 1/2: 4.96 cm2
MV M vel: 4.99 m/s
MV Peak grad: 99.6 mm[Hg]
S' Lateral: 2.8 cm

## 2023-06-17 NOTE — Telephone Encounter (Signed)
Pt wanted to switch her cardiac rehab session to 6:45am to start on 07/24/23

## 2023-06-22 ENCOUNTER — Encounter (HOSPITAL_COMMUNITY)
Admission: RE | Admit: 2023-06-22 | Discharge: 2023-06-22 | Disposition: A | Discharge status: Home / Self Care | Payer: Medicare Other | Source: Ambulatory Visit | Attending: Cardiology | Admitting: Cardiology

## 2023-06-22 DIAGNOSIS — Z951 Presence of aortocoronary bypass graft: Secondary | ICD-10-CM | POA: Insufficient documentation

## 2023-06-22 DIAGNOSIS — Z952 Presence of prosthetic heart valve: Secondary | ICD-10-CM | POA: Insufficient documentation

## 2023-06-24 ENCOUNTER — Encounter (HOSPITAL_COMMUNITY)
Admission: RE | Admit: 2023-06-24 | Discharge: 2023-06-24 | Disposition: A | Discharge status: Home / Self Care | Payer: Medicare Other | Source: Ambulatory Visit | Attending: Cardiology | Admitting: Cardiology

## 2023-06-24 DIAGNOSIS — Z951 Presence of aortocoronary bypass graft: Secondary | ICD-10-CM

## 2023-06-24 DIAGNOSIS — Z952 Presence of prosthetic heart valve: Secondary | ICD-10-CM | POA: Diagnosis not present

## 2023-06-25 ENCOUNTER — Other Ambulatory Visit (HOSPITAL_COMMUNITY): Payer: Self-pay

## 2023-06-26 ENCOUNTER — Telehealth (HOSPITAL_COMMUNITY): Payer: Self-pay | Admitting: Cardiology

## 2023-06-26 ENCOUNTER — Encounter: Payer: Self-pay | Admitting: Thoracic Surgery (Cardiothoracic Vascular Surgery)

## 2023-06-26 ENCOUNTER — Encounter (HOSPITAL_COMMUNITY)
Admission: RE | Admit: 2023-06-26 | Discharge: 2023-06-26 | Disposition: A | Discharge status: Home / Self Care | Payer: Medicare Other | Source: Ambulatory Visit | Attending: Cardiology | Admitting: Cardiology

## 2023-06-26 DIAGNOSIS — Z951 Presence of aortocoronary bypass graft: Secondary | ICD-10-CM | POA: Diagnosis not present

## 2023-06-26 DIAGNOSIS — Z952 Presence of prosthetic heart valve: Secondary | ICD-10-CM | POA: Diagnosis not present

## 2023-06-26 NOTE — Progress Notes (Signed)
[name] received a call from Dr Campbell Lerner office about her echo results. Denishia says she is concerned about exercising at cardiac rehab. Will consult onsite provider Edd Fabian NP.  Per onsite provider Edd Fabian NP. ok, worsening tricuspid regurgitation was noted on her echocardiogram. She has an upcoming appointment with Dr. Bjorn Pippin on the 16th. This should not prevent her from continuing to be physically active and participating in cardiac rehab. Does she have any symptoms?  no symptoms   heart rate 79  6 mins JC Ronney Asters, NP I would recommend continued physical activity and cardiac rehab. Maintaining her physical activity will help [name] to have a early detection if she has more valvular impairment. If she were to become sedentary symptoms would not present as soon.  5 mins bp 116/78  thanks so much I will let her know we will proceed!   Patient aware will continue to monitor.  Thayer Headings RN BSN

## 2023-06-26 NOTE — Telephone Encounter (Signed)
Patient is scheduled for an echocardiogram in Dec. Order has expired and will need to be updated please. Thank you.

## 2023-06-28 NOTE — Telephone Encounter (Signed)
She had echocardiogram last month, does not need repeat echo

## 2023-06-29 ENCOUNTER — Encounter (HOSPITAL_COMMUNITY)
Admission: RE | Admit: 2023-06-29 | Discharge: 2023-06-29 | Disposition: A | Discharge status: Home / Self Care | Payer: Medicare Other | Source: Ambulatory Visit | Attending: Cardiology

## 2023-06-29 DIAGNOSIS — Z952 Presence of prosthetic heart valve: Secondary | ICD-10-CM

## 2023-06-29 DIAGNOSIS — Z951 Presence of aortocoronary bypass graft: Secondary | ICD-10-CM

## 2023-06-29 NOTE — Progress Notes (Signed)
Reviewed home exercise guidelines with Jozlyn including endpoints, temperature precautions, target heart rate and rate of perceived exertion. She is walking 30 minutes 2-3 days/week weather permitting as her mode of home exercise. She also has a stationary bike that she plans to use for her exercise. Charish voices understanding of instructions given.  Artist Pais, MS, ACSM CEP

## 2023-06-30 NOTE — Progress Notes (Signed)
Cardiac Individual Treatment Plan  Patient Details  Name: [name] MRN: [id number] Date of Birth: 05-Jan-1956 Referring Provider:   Flowsheet Row INTENSIVE CARDIAC REHAB ORIENT from 06/10/2023 in [facility] Heart, Vascular, & Lung Health  Referring Provider Epifanio Lesches, MD       Initial Encounter Date:  Flowsheet Row INTENSIVE CARDIAC REHAB ORIENT from 06/10/2023 in [facility] for Heart, Vascular, & Lung Health  Date 06/10/23       Visit Diagnosis: 05/06/23 S/P CABG x 1  05/06/23 S/P aortic valve replacement  Patient's Home Medications on Admission:  Current Outpatient Medications:    acetaminophen (TYLENOL) 650 MG CR tablet, Take 650 mg by mouth every 8 (eight) hours as needed for pain., Disp: , Rfl:    aspirin EC 81 MG tablet, Take 1 tablet (81 mg total) by mouth daily. Swallow whole., Disp: 150 tablet, Rfl: 2   Calcium Carbonate (CALCI-CHEW PO), Take 1 tablet by mouth in the morning., Disp: , Rfl:    calcium carbonate (TUMS EX) 750 MG chewable tablet, Chew 2 tablets by mouth daily as needed for heartburn., Disp: , Rfl:    cholecalciferol (VITAMIN D3) 25 MCG (1000 UNIT) tablet, Take 1,000 Units by mouth daily., Disp: , Rfl:    escitalopram (LEXAPRO) 10 MG tablet, Take 10 mg by mouth daily., Disp: , Rfl:    ferrous sulfate 325 (65 FE) MG EC tablet, Take 1 tablet (325 mg total) by mouth daily with breakfast. For one month then stop. If develops constipation, may take laxative or stop iron., Disp: , Rfl:    metoprolol tartrate (LOPRESSOR) 25 MG tablet, Take 0.5 tablets (12.5 mg total) by mouth 2 (two) times daily., Disp: 60 tablet, Rfl: 1   Polyethyl Glycol-Propyl Glycol (LUBRICATING EYE DROPS OP), Place 1 drop into both eyes daily as needed (dry eyes)., Disp: , Rfl:    rosuvastatin (CRESTOR) 10 MG tablet, Take 10 mg by mouth daily., Disp: , Rfl:    traMADol (ULTRAM) 50 MG tablet, Take 1 tablet (50 mg total) by  mouth every 6 (six) hours as needed for moderate pain (pain score 4-6)., Disp: 28 tablet, Rfl: 0  Past Medical History: Past Medical History:  Diagnosis Date   Anxiety    Aortic valve stenosis    Arthritis    Breast cancer (HCC) 2004   right breast; ER/PR+, Her2+   Cancer (HCC) 10/2002   right breast lumpectomy   Coronary artery disease    Dysrhythmia    2nd Degree AV block   GERD (gastroesophageal reflux disease)    Heart murmur    History of kidney stones    HX   HTN (hypertension)    Hyperlipidemia    Morton neuroma    Personal history of chemotherapy 11/19/2002   Personal history of radiation therapy 02/19/2003   Pneumonia    Pre-diabetes    Presence of permanent cardiac pacemaker 01/21/2023   Medtronic    Tobacco Use: Social History   Tobacco Use  Smoking Status Never  Smokeless Tobacco Never    Labs: Review Flowsheet  More data may exist      Latest Ref Rng & Units 02/13/2021 04/14/2023 05/04/2023 05/06/2023 05/08/2023  Labs for ITP Cardiac and Pulmonary Rehab  Cholestrol 0 - 200 mg/dL - - - - 60   LDL (calc) 0 - 99 mg/dL - - - - 15   HDL-C >32 mg/dL - - - - 27   Trlycerides <150 mg/dL - - - -  92   Hemoglobin A1c 4.8 - 5.6 % - - 5.7  - -  PH, Arterial 7.35 - 7.45 7.379  7.323  7.43  7.365  7.285  7.275  7.337  7.375  -  PCO2 arterial 32 - 48 mmHg 41.7  45.9  36  44.7  41.3  46.4  38.1  34.8  -  Bicarbonate 20.0 - 28.0 mmol/L 25.8  24.6  24.3  24.1  23.8  23.9  25.5  19.7  21.8  20.4  21.9  20.4  -  TCO2 22 - 32 mmol/L 27  26  26  25  25   - 27  21  23  20  22  22  23  21  22   -  Acid-base deficit 0.0 - 2.0 mmol/L 1.0  2.0  2.0  2.0  0.1  7.0  5.0  5.0  3.0  4.0  -  O2 Saturation % 76.0  99.0  69  72  98  95.7  98  99  98  92  74  100  -    Details       Multiple values from one day are sorted in reverse-chronological order         Capillary Blood Glucose: Lab Results  Component Value Date   GLUCAP 116 (H) 05/11/2023   GLUCAP 123 (H) 05/10/2023    GLUCAP 87 05/10/2023   GLUCAP 92 05/10/2023   GLUCAP 125 (H) 05/09/2023     Exercise Target Goals: Exercise Program Goal: Individual exercise prescription set using results from initial 6 min walk test and THRR while considering  patient's activity barriers and safety.   Exercise Prescription Goal: Initial exercise prescription builds to 30-45 minutes a day of aerobic activity, 2-3 days per week.  Home exercise guidelines will be given to patient during program as part of exercise prescription that the participant will acknowledge.  Activity Barriers & Risk Stratification:  Activity Barriers & Cardiac Risk Stratification - 06/10/23 1108       Activity Barriers & Cardiac Risk Stratification   Activity Barriers Arthritis;Back Problems;Incisional Pain    Cardiac Risk Stratification High   <5 METs on            6 Minute Walk:  6 Minute Walk     Row Name 06/10/23 1208         6 Minute Walk   Phase Initial     Distance 1310 feet     Walk Time 6 minutes     # of Rest Breaks 0     MPH 2.48     METS 2.93     RPE 10     Perceived Dyspnea  0     VO2 Peak 10.26     Symptoms No     Resting HR 80 bpm     Resting BP 108/66     Resting Oxygen Saturation  94 %     Exercise Oxygen Saturation  during 6 min walk 95 %     Max Ex. HR 101 bpm     Max Ex. BP 122/68     2 Minute Post BP 112/70              Oxygen Initial Assessment:   Oxygen Re-Evaluation:   Oxygen Discharge (Final Oxygen Re-Evaluation):   Initial Exercise Prescription:  Initial Exercise Prescription - 06/10/23 1200       Date of Initial Exercise RX and Referring Provider   Date 06/10/23  Referring Provider Epifanio Lesches, MD    Expected Discharge Date 09/02/23      Treadmill   MPH 2    Grade 0    Minutes 15    METs 2.5      Recumbant Bike   Level 1    RPM 60    Watts 18    Minutes 15    METs 2.5      Prescription Details   Frequency (times per week) 3    Duration  Progress to 30 minutes of continuous aerobic without signs/symptoms of physical distress      Intensity   THRR 40-80% of Max Heartrate 61-122    Ratings of Perceived Exertion 11-13    Perceived Dyspnea 0-4      Progression   Progression Continue progressive overload as per policy without signs/symptoms or physical distress.      Resistance Training   Training Prescription Yes    Weight 2    Reps 10-15             Perform Capillary Blood Glucose checks as needed.  Exercise Prescription Changes:   Exercise Prescription Changes     Row Name 06/15/23 1017 06/22/23 1030           Response to Exercise   Blood Pressure (Admit) 142/68 122/64      Blood Pressure (Exercise) 122/58 122/72      Blood Pressure (Exit) 120/68 108/70      Heart Rate (Admit) 79 bpm 79 bpm      Heart Rate (Exercise) 115 bpm 111 bpm      Heart Rate (Exit) 89 bpm 88 bpm      Rating of Perceived Exertion (Exercise) 11 11      Symptoms None None      Comments Off to a good start with exercise. Reviewed METs with Stanton Kidney.      Duration Continue with 30 min of aerobic exercise without signs/symptoms of physical distress. Continue with 30 min of aerobic exercise without signs/symptoms of physical distress.      Intensity THRR unchanged THRR unchanged        Progression   Progression Continue to progress workloads to maintain intensity without signs/symptoms of physical distress. Continue to progress workloads to maintain intensity without signs/symptoms of physical distress.      Average METs 2.4 2.4        Resistance Training   Training Prescription Yes Yes      Weight 2 2      Reps 10-15 10-15      Time 10 Minutes 10 Minutes        Interval Training   Interval Training No No        Treadmill   MPH 2 2      Grade 0 0      Minutes 15 15      METs 2.53 2.53        Recumbant Bike   Level 1 2      RPM 74 66      Watts 18 21      Minutes 15 15      METs 2.3 2.3               Exercise  Comments:   Exercise Comments     Row Name 06/15/23 1127 06/22/23 1122 06/29/23 1052       Exercise Comments Akila tolerated low intensity exercise well without symptoms. Oriented to the stretching and exercise routine. Reviewed  METs with Stanton Kidney. Reviewed home exercise guidelines and goals with Aba.              Exercise Goals and Review:   Exercise Goals     Row Name 06/10/23 1109             Exercise Goals   Increase Physical Activity Yes       Intervention Provide advice, education, support and counseling about physical activity/exercise needs.;Develop an individualized exercise prescription for aerobic and resistive training based on initial evaluation findings, risk stratification, comorbidities and participant's personal goals.       Expected Outcomes Short Term: Attend rehab on a regular basis to increase amount of physical activity.;Long Term: Add in home exercise to make exercise part of routine and to increase amount of physical activity.;Long Term: Exercising regularly at least 3-5 days a week.       Increase Strength and Stamina Yes       Intervention Provide advice, education, support and counseling about physical activity/exercise needs.;Develop an individualized exercise prescription for aerobic and resistive training based on initial evaluation findings, risk stratification, comorbidities and participant's personal goals.       Expected Outcomes Short Term: Perform resistance training exercises routinely during rehab and add in resistance training at home;Short Term: Increase workloads from initial exercise prescription for resistance, speed, and METs.;Long Term: Improve cardiorespiratory fitness, muscular endurance and strength as measured by increased METs and functional capacity ( )       Able to understand and use rate of perceived exertion (RPE) scale Yes       Intervention Provide education and explanation on how to use RPE scale       Expected Outcomes  Short Term: Able to use RPE daily in rehab to express subjective intensity level;Long Term:  Able to use RPE to guide intensity level when exercising independently       Knowledge and understanding of Target Heart Rate Range (THRR) Yes       Intervention Provide education and explanation of THRR including how the numbers were predicted and where they are located for reference       Expected Outcomes Short Term: Able to state/look up THRR;Short Term: Able to use daily as guideline for intensity in rehab;Long Term: Able to use THRR to govern intensity when exercising independently       Understanding of Exercise Prescription Yes       Intervention Provide education, explanation, and written materials on patient's individual exercise prescription       Expected Outcomes Short Term: Able to explain program exercise prescription;Long Term: Able to explain home exercise prescription to exercise independently                Exercise Goals Re-Evaluation :  Exercise Goals Re-Evaluation     Row Name 06/15/23 1127 06/29/23 1052           Exercise Goal Re-Evaluation   Exercise Goals Review Increase Physical Activity;Increase Strength and Stamina;Able to understand and use rate of perceived exertion (RPE) scale Increase Physical Activity;Increase Strength and Stamina;Able to understand and use rate of perceived exertion (RPE) scale;Understanding of Exercise Prescription;Knowledge and understanding of Target Heart Rate Range (THRR)      Comments Deloris was able to understand and use RPE scale appropriately. Reviewed exercise prescription with Joslyne. She is walking 30 minutes 2-3 days/week and plans to start riding her stationary bike.      Expected Outcomes Progress workloads as tolerated to help increase cardiorespiratory fitness.  Aunisty will walk or ride her stationary bike 30 minutes 2-3 days/week to achieve 150 minutes of aerobic exercise/week.               Discharge Exercise Prescription  (Final Exercise Prescription Changes):  Exercise Prescription Changes - 06/22/23 1030       Response to Exercise   Blood Pressure (Admit) 122/64    Blood Pressure (Exercise) 122/72    Blood Pressure (Exit) 108/70    Heart Rate (Admit) 79 bpm    Heart Rate (Exercise) 111 bpm    Heart Rate (Exit) 88 bpm    Rating of Perceived Exertion (Exercise) 11    Symptoms None    Comments Reviewed METs with Stanton Kidney.    Duration Continue with 30 min of aerobic exercise without signs/symptoms of physical distress.    Intensity THRR unchanged      Progression   Progression Continue to progress workloads to maintain intensity without signs/symptoms of physical distress.    Average METs 2.4      Resistance Training   Training Prescription Yes    Weight 2    Reps 10-15    Time 10 Minutes      Interval Training   Interval Training No      Treadmill   MPH 2    Grade 0    Minutes 15    METs 2.53      Recumbant Bike   Level 2    RPM 66    Watts 21    Minutes 15    METs 2.3             Nutrition:  Target Goals: Understanding of nutrition guidelines, daily intake of sodium 1500mg , cholesterol 200mg , calories 30% from fat and 7% or less from saturated fats, daily to have 5 or more servings of fruits and vegetables.  Biometrics:  Pre Biometrics - 06/10/23 1107       Pre Biometrics   Waist Circumference 34.5 inches    Hip Circumference 38.5 inches    Waist to Hip Ratio 0.9 %    Triceps Skinfold 35 mm    % Body Fat 40.2 %    Grip Strength 24 kg    Flexibility 14 in    Single Leg Stand 30 seconds              Nutrition Therapy Plan and Nutrition Goals:  Nutrition Therapy & Goals - 06/15/23 1151       Nutrition Therapy   Diet Heart Healthy Diet    Drug/Food Interactions Statins/Certain Fruits      Personal Nutrition Goals   Nutrition Goal Patient to identify strategies for reducing cardiovascular risk by attending the Pritikin education and nutrition series weekly.     Personal Goal #2 Patient to improve diet quality by using the plate method as a guide for meal planning to include lean protein/plant protein, fruits, vegetables, whole grains, nonfat dairy as part of a well-balanced diet.    Comments Alexxys has medical history of s/p AVR, CABG x1, breast cancer. Her lipids are well controlled at goal. Patient will benefit from participation in intensive cardiac rehab for nutrition, exercise, and lifestyle modification.      Intervention Plan   Intervention Prescribe, educate and counsel regarding individualized specific dietary modifications aiming towards targeted core components such as weight, hypertension, lipid management, diabetes, heart failure and other comorbidities.;Nutrition handout(s) given to patient.    Expected Outcomes Short Term Goal: Understand basic principles of dietary content, such  as calories, fat, sodium, cholesterol and nutrients.;Long Term Goal: Adherence to prescribed nutrition plan.             Nutrition Assessments:  Nutrition Assessments - 06/15/23 1454       Rate Your Plate Scores   Pre Score 68            MEDIFICTS Score Key: >=70 Need to make dietary changes  40-70 Heart Healthy Diet <= 40 Therapeutic Level Cholesterol Diet   Flowsheet Row INTENSIVE CARDIAC REHAB from 06/15/2023 in [facility] for Heart, Vascular, & Lung Health  Picture Your Plate Total Score on Admission 68      Picture Your Plate Scores: <69 Unhealthy dietary pattern with much room for improvement. 41-50 Dietary pattern unlikely to meet recommendations for good health and room for improvement. 51-60 More healthful dietary pattern, with some room for improvement.  >60 Healthy dietary pattern, although there may be some specific behaviors that could be improved.    Nutrition Goals Re-Evaluation:  Nutrition Goals Re-Evaluation     Row Name 06/15/23 1151             Goals   Current Weight 159 lb 13.3 oz  (72.5 kg)       Comment LDL 15, HDL 27, A1c 5.7       Expected Outcome Paulina has medical history of s/p AVR, CABG x1, breast cancer. Her lipids are well controlled at goal. Patient will benefit from participation in intensive cardiac rehab for nutrition, exercise, and lifestyle modification.                Nutrition Goals Re-Evaluation:  Nutrition Goals Re-Evaluation     Row Name 06/15/23 1151             Goals   Current Weight 159 lb 13.3 oz (72.5 kg)       Comment LDL 15, HDL 27, A1c 5.7       Expected Outcome Maryia has medical history of s/p AVR, CABG x1, breast cancer. Her lipids are well controlled at goal. Patient will benefit from participation in intensive cardiac rehab for nutrition, exercise, and lifestyle modification.                Nutrition Goals Discharge (Final Nutrition Goals Re-Evaluation):  Nutrition Goals Re-Evaluation - 06/15/23 1151       Goals   Current Weight 159 lb 13.3 oz (72.5 kg)    Comment LDL 15, HDL 27, A1c 5.7    Expected Outcome Lakima has medical history of s/p AVR, CABG x1, breast cancer. Her lipids are well controlled at goal. Patient will benefit from participation in intensive cardiac rehab for nutrition, exercise, and lifestyle modification.             Psychosocial: Target Goals: Acknowledge presence or absence of significant depression and/or stress, maximize coping skills, provide positive support system. Participant is able to verbalize types and ability to use techniques and skills needed for reducing stress and depression.  Initial Review & Psychosocial Screening:  Initial Psych Review & Screening - 06/10/23 1110       Initial Review   Current issues with Current Psychotropic Meds      Family Dynamics   Good Support System? Yes   Klaudia has her son and daughter for support   Comments Merari takes Lexapro and feels it is working for her. She denies any current feelings of anxiety/depression/stress.      Barriers    Psychosocial barriers  to participate in program There are no identifiable barriers or psychosocial needs.      Screening Interventions   Interventions Encouraged to exercise;Provide feedback about the scores to participant    Expected Outcomes Short Term goal: Identification and review with participant of any Quality of Life or Depression concerns found by scoring the questionnaire.;Long Term goal: The participant improves quality of Life and PHQ9 Scores as seen by post scores and/or verbalization of changes             Quality of Life Scores:  Quality of Life - 06/10/23 1124       Quality of Life   Select Quality of Life      Quality of Life Scores   Health/Function Pre 25 %    Socioeconomic Pre 30 %    Psych/Spiritual Pre 27.5 %    Family Pre 27 %    GLOBAL Pre 26.79 %            Scores of 19 and below usually indicate a poorer quality of life in these areas.  A difference of  2-3 points is a clinically meaningful difference.  A difference of 2-3 points in the total score of the Quality of Life Index has been associated with significant improvement in overall quality of life, self-image, physical symptoms, and general health in studies assessing change in quality of life.  PHQ-9: Review Flowsheet       06/10/2023 01/30/2014  Depression screen PHQ 2/9  Decreased Interest 0 -  Down, Depressed, Hopeless 0 0  PHQ - 2 Score 0 0  Altered sleeping 2 -  Tired, decreased energy 0 -  Change in appetite 0 -  Feeling bad or failure about yourself  0 -  Trouble concentrating 0 -  Moving slowly or fidgety/restless 0 -  Suicidal thoughts 0 -  PHQ-9 Score 2 -  Difficult doing work/chores Not difficult at all -    Details           Interpretation of Total Score  Total Score Depression Severity:  1-4 = Minimal depression, 5-9 = Mild depression, 10-14 = Moderate depression, 15-19 = Moderately severe depression, 20-27 = Severe depression   Psychosocial Evaluation and  Intervention:   Psychosocial Re-Evaluation:  Psychosocial Re-Evaluation     Row Name 06/15/23 1154 06/30/23 1258           Psychosocial Re-Evaluation   Current issues with Current Psychotropic Meds Current Psychotropic Meds;Current Stress Concerns      Comments Tanice did not voice any increased concerns or stressors on her first day of exercise. Aspynn voiced concerns about her recent echo report. Dilpreet will discuss with Dr Bjorn Pippin on 07/06/23. Sharee is switching to the 0645 class in January 2025.      Interventions Encouraged to attend Cardiac Rehabilitation for the exercise;Stress management education;Relaxation education Encouraged to attend Cardiac Rehabilitation for the exercise;Stress management education;Relaxation education      Continue Psychosocial Services  No Follow up required Follow up required by staff        Initial Review   Source of Stress Concerns -- Chronic Illness      Comments -- Will continue to monitor and offer support as needed.               Psychosocial Discharge (Final Psychosocial Re-Evaluation):  Psychosocial Re-Evaluation - 06/30/23 1258       Psychosocial Re-Evaluation   Current issues with Current Psychotropic Meds;Current Stress Concerns    Comments Anishia voiced  concerns about her recent echo report. Shandrea will discuss with Dr Bjorn Pippin on 07/06/23. Grizelda is switching to the 0645 class in January 2025.    Interventions Encouraged to attend Cardiac Rehabilitation for the exercise;Stress management education;Relaxation education    Continue Psychosocial Services  Follow up required by staff      Initial Review   Source of Stress Concerns Chronic Illness    Comments Will continue to monitor and offer support as needed.             Vocational Rehabilitation: Provide vocational rehab assistance to qualifying candidates.   Vocational Rehab Evaluation & Intervention:  Vocational Rehab - 06/10/23 1115       Initial Vocational Rehab  Evaluation & Intervention   Assessment shows need for Vocational Rehabilitation No   Jenny is retired            Education: Education Goals: Education classes will be provided on a weekly basis, covering required topics. Participant will state understanding/return demonstration of topics presented.    Education     Row Name 06/15/23 1200     Education   Cardiac Education Topics Pritikin   Nurse, children's Exercise Physiologist   Select Psychosocial   Psychosocial Healthy Minds, Bodies, Hearts   Instruction Review Code 1- Verbalizes Understanding   Class Start Time 1142   Class Stop Time 1218   Class Time Calculation (min) 36 min    Row Name 06/24/23 1100     Education   Cardiac Education Topics Pritikin   Customer service manager   Weekly Topic Tasty Appetizers and Snacks   Instruction Review Code 1- Verbalizes Understanding   Class Start Time 1145   Class Stop Time 1223   Class Time Calculation (min) 38 min    Row Name 06/26/23 1500     Education   Cardiac Education Topics Pritikin   Glass blower/designer Nutrition   Nutrition Workshop Label Reading   Instruction Review Code 1- Verbalizes Understanding   Class Start Time 1144   Class Stop Time 1219   Class Time Calculation (min) 35 min    Row Name 06/29/23 1600     Education   Cardiac Education Topics Pritikin   Acupuncturist     Workshops   Educator Exercise Physiologist   Select Psychosocial   Psychosocial Workshop Recognizing and Reducing Stress   Instruction Review Code 1- Verbalizes Understanding   Class Start Time 1142   Class Stop Time 1231   Class Time Calculation (min) 49 min            Core Videos: Exercise    Move It!  Clinical staff conducted group or individual video education with verbal and written material and guidebook.  Patient learns the recommended  Pritikin exercise program. Exercise with the goal of living a long, healthy life. Some of the health benefits of exercise include controlled diabetes, healthier blood pressure levels, improved cholesterol levels, improved heart and lung capacity, improved sleep, and better body composition. Everyone should speak with their doctor before starting or changing an exercise routine.  Biomechanical Limitations Clinical staff conducted group or individual video education with verbal and written material and guidebook.  Patient learns how biomechanical limitations can impact exercise and how we can mitigate and possibly overcome limitations to have an impactful and balanced  exercise routine.  Body Composition Clinical staff conducted group or individual video education with verbal and written material and guidebook.  Patient learns that body composition (ratio of muscle mass to fat mass) is a key component to assessing overall fitness, rather than body weight alone. Increased fat mass, especially visceral belly fat, can put [name] at increased risk for metabolic syndrome, type 2 diabetes, heart disease, and even death. It is recommended to combine diet and exercise (cardiovascular and resistance training) to improve your body composition. Seek guidance from your physician and exercise physiologist before implementing an exercise routine.  Exercise Action Plan Clinical staff conducted group or individual video education with verbal and written material and guidebook.  Patient learns the recommended strategies to achieve and enjoy long-term exercise adherence, including variety, self-motivation, self-efficacy, and positive decision making. Benefits of exercise include fitness, good health, weight management, more energy, better sleep, less stress, and overall well-being.  Medical   Heart Disease Risk Reduction Clinical staff conducted group or individual video education with verbal and written material and  guidebook.  Patient learns our heart is our most vital organ as it circulates oxygen, nutrients, white blood cells, and hormones throughout the entire body, and carries waste away. Data supports a plant-based eating plan like the Pritikin Program for its effectiveness in slowing progression of and reversing heart disease. The video provides a number of recommendations to address heart disease.   Metabolic Syndrome and Belly Fat  Clinical staff conducted group or individual video education with verbal and written material and guidebook.  Patient learns what metabolic syndrome is, how it leads to heart disease, and how one can reverse it and keep it from coming back. You have metabolic syndrome if you have 3 of the following 5 criteria: abdominal obesity, high blood pressure, high triglycerides, low HDL cholesterol, and high blood sugar.  Hypertension and Heart Disease Clinical staff conducted group or individual video education with verbal and written material and guidebook.  Patient learns that high blood pressure, or hypertension, is very common in the [place]. Hypertension is largely due to excessive salt intake, but other important risk factors include being overweight, physical inactivity, drinking too much alcohol, smoking, and not eating enough potassium from fruits and vegetables. High blood pressure is a leading risk factor for heart attack, stroke, congestive heart failure, dementia, kidney failure, and premature death. Long-term effects of excessive salt intake include stiffening of the arteries and thickening of heart muscle and organ damage. Recommendations include ways to reduce hypertension and the risk of heart disease.  Diseases of Our Time - Focusing on Diabetes Clinical staff conducted group or individual video education with verbal and written material and guidebook.  Patient learns why the best way to stop diseases of our time is prevention, through food and other lifestyle  changes. Medicine (such as prescription pills and surgeries) is often only a Band-Aid on the problem, not a long-term solution. Most common diseases of our time include obesity, type 2 diabetes, hypertension, heart disease, and cancer. The Pritikin Program is recommended and has been proven to help reduce, reverse, and/or prevent the damaging effects of metabolic syndrome.  Nutrition   Overview of the Pritikin Eating Plan  Clinical staff conducted group or individual video education with verbal and written material and guidebook.  Patient learns about the Pritikin Eating Plan for disease risk reduction. The Pritikin Eating Plan emphasizes a wide variety of unrefined, minimally-processed carbohydrates, like fruits, vegetables, whole grains, and legumes. Go, Caution, and Stop  food choices are explained. Plant-based and lean animal proteins are emphasized. Rationale provided for low sodium intake for blood pressure control, low added sugars for blood sugar stabilization, and low added fats and oils for coronary artery disease risk reduction and weight management.  Calorie Density  Clinical staff conducted group or individual video education with verbal and written material and guidebook.  Patient learns about calorie density and how it impacts the Pritikin Eating Plan. Knowing the characteristics of the food you choose will help you decide whether those foods will lead to weight gain or weight loss, and whether you want to consume more or less of them. Weight loss is usually a side effect of the Pritikin Eating Plan because of its focus on low calorie-dense foods.  Label Reading  Clinical staff conducted group or individual video education with verbal and written material and guidebook.  Patient learns about the Pritikin recommended label reading guidelines and corresponding recommendations regarding calorie density, added sugars, sodium content, and whole grains.  Dining Out - Part 1  Clinical staff  conducted group or individual video education with verbal and written material and guidebook.  Patient learns that restaurant meals can be sabotaging because they can be so high in calories, fat, sodium, and/or sugar. Patient learns recommended strategies on how to positively address this and avoid unhealthy pitfalls.  Facts on Fats  Clinical staff conducted group or individual video education with verbal and written material and guidebook.  Patient learns that lifestyle modifications can be just as effective, if not more so, as many medications for lowering your risk of heart disease. A Pritikin lifestyle can help to reduce your risk of inflammation and atherosclerosis (cholesterol build-up, or plaque, in the artery walls). Lifestyle interventions such as dietary choices and physical activity address the cause of atherosclerosis. A review of the types of fats and their impact on blood cholesterol levels, along with dietary recommendations to reduce fat intake is also included.  Nutrition Action Plan  Clinical staff conducted group or individual video education with verbal and written material and guidebook.  Patient learns how to incorporate Pritikin recommendations into their lifestyle. Recommendations include planning and keeping personal health goals in mind as an important part of their success.  Healthy Mind-Set    Healthy Minds, Bodies, Hearts  Clinical staff conducted group or individual video education with verbal and written material and guidebook.  Patient learns how to identify when they are stressed. Video will discuss the impact of that stress, as well as the many benefits of stress management. Patient will also be introduced to stress management techniques. The way we think, act, and feel has an impact on our hearts.  How Our Thoughts Can Heal Our Hearts  Clinical staff conducted group or individual video education with verbal and written material and guidebook.  Patient learns that  negative thoughts can cause depression and anxiety. This can result in negative lifestyle behavior and serious health problems. Cognitive behavioral therapy is an effective method to help control our thoughts in order to change and improve our emotional outlook.  Additional Videos:  Exercise    Improving Performance  Clinical staff conducted group or individual video education with verbal and written material and guidebook.  Patient learns to use a non-linear approach by alternating intensity levels and lengths of time spent exercising to help burn more calories and lose more body fat. Cardiovascular exercise helps improve heart health, metabolism, hormonal balance, blood sugar control, and recovery from fatigue. Resistance training improves strength,  endurance, balance, coordination, reaction time, metabolism, and muscle mass. Flexibility exercise improves circulation, posture, and balance. Seek guidance from your physician and exercise physiologist before implementing an exercise routine and learn your capabilities and proper form for all exercise.  Introduction to Yoga  Clinical staff conducted group or individual video education with verbal and written material and guidebook.  Patient learns about yoga, a discipline of the coming together of mind, breath, and body. The benefits of yoga include improved flexibility, improved range of motion, better posture and core strength, increased lung function, weight loss, and positive self-image. Yoga's heart health benefits include lowered blood pressure, healthier heart rate, decreased cholesterol and triglyceride levels, improved immune function, and reduced stress. Seek guidance from your physician and exercise physiologist before implementing an exercise routine and learn your capabilities and proper form for all exercise.  Medical   Aging: Enhancing Your Quality of Life  Clinical staff conducted group or individual video education with verbal and  written material and guidebook.  Patient learns key strategies and recommendations to stay in good physical health and enhance quality of life, such as prevention strategies, having an advocate, securing a Health Care Proxy and Power of Attorney, and keeping a list of medications and system for tracking them. It also discusses how to avoid risk for bone loss.  Biology of Weight Control  Clinical staff conducted group or individual video education with verbal and written material and guidebook.  Patient learns that weight gain occurs because we consume more calories than we burn (eating more, moving less). Even if your body weight is normal, you may have higher ratios of fat compared to muscle mass. Too much body fat puts you at increased risk for cardiovascular disease, heart attack, stroke, type 2 diabetes, and obesity-related cancers. In addition to exercise, following the Pritikin Eating Plan can help reduce your risk.  Decoding Lab Results  Clinical staff conducted group or individual video education with verbal and written material and guidebook.  Patient learns that lab test reflects one measurement whose values change over time and are influenced by many factors, including medication, stress, sleep, exercise, food, hydration, pre-existing medical conditions, and more. It is recommended to use the knowledge from this video to become more involved with your lab results and evaluate your numbers to speak with your doctor.   Diseases of Our Time - Overview  Clinical staff conducted group or individual video education with verbal and written material and guidebook.  Patient learns that according to the CDC, 50% to 70% of chronic diseases (such as obesity, type 2 diabetes, elevated lipids, hypertension, and heart disease) are avoidable through lifestyle improvements including healthier food choices, listening to satiety cues, and increased physical activity.  Sleep Disorders Clinical staff  conducted group or individual video education with verbal and written material and guidebook.  Patient learns how good quality and duration of sleep are important to overall health and well-being. Patient also learns about sleep disorders and how they impact health along with recommendations to address them, including discussing with a physician.  Nutrition  Dining Out - Part 2 Clinical staff conducted group or individual video education with verbal and written material and guidebook.  Patient learns how to plan ahead and communicate in order to maximize their dining experience in a healthy and nutritious manner. Included are recommended food choices based on the type of restaurant the patient is visiting.   Fueling a Banker conducted group or individual video education with verbal  and written material and guidebook.  There is a strong connection between our food choices and our health. Diseases like obesity and type 2 diabetes are very prevalent and are in large-part due to lifestyle choices. The Pritikin Eating Plan provides plenty of food and hunger-curbing satisfaction. It is easy to follow, affordable, and helps reduce health risks.  Menu Workshop  Clinical staff conducted group or individual video education with verbal and written material and guidebook.  Patient learns that restaurant meals can sabotage health goals because they are often packed with calories, fat, sodium, and sugar. Recommendations include strategies to plan ahead and to communicate with the manager, chef, or server to help order a healthier meal.  Planning Your Eating Strategy  Clinical staff conducted group or individual video education with verbal and written material and guidebook.  Patient learns about the Pritikin Eating Plan and its benefit of reducing the risk of disease. The Pritikin Eating Plan does not focus on calories. Instead, it emphasizes high-quality, nutrient-rich foods. By knowing  the characteristics of the foods, we choose, we can determine their calorie density and make informed decisions.  Targeting Your Nutrition Priorities  Clinical staff conducted group or individual video education with verbal and written material and guidebook.  Patient learns that lifestyle habits have a tremendous impact on disease risk and progression. This video provides eating and physical activity recommendations based on your personal health goals, such as reducing LDL cholesterol, losing weight, preventing or controlling type 2 diabetes, and reducing high blood pressure.  Vitamins and Minerals  Clinical staff conducted group or individual video education with verbal and written material and guidebook.  Patient learns different ways to obtain key vitamins and minerals, including through a recommended healthy diet. It is important to discuss all supplements you take with your doctor.   Healthy Mind-Set    Smoking Cessation  Clinical staff conducted group or individual video education with verbal and written material and guidebook.  Patient learns that cigarette smoking and tobacco addiction pose a serious health risk which affects millions of people. Stopping smoking will significantly reduce the risk of heart disease, lung disease, and many forms of cancer. Recommended strategies for quitting are covered, including working with your doctor to develop a successful plan.  Culinary   Becoming a Set designer conducted group or individual video education with verbal and written material and guidebook.  Patient learns that cooking at home can be healthy, cost-effective, quick, and puts them in control. Keys to cooking healthy recipes will include looking at your recipe, assessing your equipment needs, planning ahead, making it simple, choosing cost-effective seasonal ingredients, and limiting the use of added fats, salts, and sugars.  Cooking - Breakfast and Snacks  Clinical  staff conducted group or individual video education with verbal and written material and guidebook.  Patient learns how important breakfast is to satiety and nutrition through the entire day. Recommendations include key foods to eat during breakfast to help stabilize blood sugar levels and to prevent overeating at meals later in the day. Planning ahead is also a key component.  Cooking - Educational psychologist conducted group or individual video education with verbal and written material and guidebook.  Patient learns eating strategies to improve overall health, including an approach to cook more at home. Recommendations include thinking of animal protein as a side on your plate rather than center stage and focusing instead on lower calorie dense options like vegetables, fruits, whole grains, and plant-based  proteins, such as beans. Making sauces in large quantities to freeze for later and leaving the skin on your vegetables are also recommended to maximize your experience.  Cooking - Healthy Salads and Dressing Clinical staff conducted group or individual video education with verbal and written material and guidebook.  Patient learns that vegetables, fruits, whole grains, and legumes are the foundations of the Pritikin Eating Plan. Recommendations include how to incorporate each of these in flavorful and healthy salads, and how to create homemade salad dressings. Proper handling of ingredients is also covered. Cooking - Soups and [employer] - Soups and Desserts Clinical staff conducted group or individual video education with verbal and written material and guidebook.  Patient learns that Pritikin soups and desserts make for easy, nutritious, and delicious snacks and meal components that are low in sodium, fat, sugar, and calorie density, while high in vitamins, minerals, and filling fiber. Recommendations include simple and healthy ideas for soups and desserts.   Overview     The  Pritikin Solution Program Overview Clinical staff conducted group or individual video education with verbal and written material and guidebook.  Patient learns that the results of the Pritikin Program have been documented in more than 100 articles published in peer-reviewed journals, and the benefits include reducing risk factors for (and, in some cases, even reversing) high cholesterol, high blood pressure, type 2 diabetes, obesity, and more! An overview of the three key pillars of the Pritikin Program will be covered: eating well, doing regular exercise, and having a healthy mind-set.  WORKSHOPS  Exercise: Exercise Basics: Building Your Action Plan Clinical staff led group instruction and group discussion with PowerPoint presentation and patient guidebook. To enhance the learning environment the use of posters, models and videos may be added. At the conclusion of this workshop, patients will comprehend the difference between physical activity and exercise, as well as the benefits of incorporating both, into their routine. Patients will understand the FITT (Frequency, Intensity, Time, and Type) principle and how to use it to build an exercise action plan. In addition, safety concerns and other considerations for exercise and cardiac rehab will be addressed by the presenter. The purpose of this lesson is to promote a comprehensive and effective weekly exercise routine in order to improve patients' overall level of fitness.   Managing Heart Disease: Your Path to a Healthier Heart Clinical staff led group instruction and group discussion with PowerPoint presentation and patient guidebook. To enhance the learning environment the use of posters, models and videos may be added.At the conclusion of this workshop, patients will understand the anatomy and physiology of the heart. Additionally, they will understand how Pritikin's three pillars impact the risk factors, the progression, and the management  of heart disease.  The purpose of this lesson is to provide a high-level overview of the heart, heart disease, and how the Pritikin lifestyle positively impacts risk factors.  Exercise Biomechanics Clinical staff led group instruction and group discussion with PowerPoint presentation and patient guidebook. To enhance the learning environment the use of posters, models and videos may be added. Patients will learn how the structural parts of their bodies function and how these functions impact their daily activities, movement, and exercise. Patients will learn how to promote a neutral spine, learn how to manage pain, and identify ways to improve their physical movement in order to promote healthy living. The purpose of this lesson is to expose patients to common physical limitations that impact physical activity. Participants will learn practical  ways to adapt and manage aches and pains, and to minimize their effect on regular exercise. Patients will learn how to maintain good posture while sitting, walking, and lifting.  Balance Training and Fall Prevention  Clinical staff led group instruction and group discussion with PowerPoint presentation and patient guidebook. To enhance the learning environment the use of posters, models and videos may be added. At the conclusion of this workshop, patients will understand the importance of their sensorimotor skills (vision, proprioception, and the vestibular system) in maintaining their ability to balance as they age. Patients will apply a variety of balancing exercises that are appropriate for their current level of function. Patients will understand the common causes for poor balance, possible solutions to these problems, and ways to modify their physical environment in order to minimize their fall risk. The purpose of this lesson is to teach patients about the importance of maintaining balance as they age and ways to minimize their risk of  falling.  WORKSHOPS   Nutrition:  Fueling a Ship broker led group instruction and group discussion with PowerPoint presentation and patient guidebook. To enhance the learning environment the use of posters, models and videos may be added. Patients will review the foundational principles of the Pritikin Eating Plan and understand what constitutes a serving size in each of the food groups. Patients will also learn Pritikin-friendly foods that are better choices when away from home and review make-ahead meal and snack options. Calorie density will be reviewed and applied to three nutrition priorities: weight maintenance, weight loss, and weight gain. The purpose of this lesson is to reinforce (in a group setting) the key concepts around what patients are recommended to eat and how to apply these guidelines when away from home by planning and selecting Pritikin-friendly options. Patients will understand how calorie density may be adjusted for different weight management goals.  Mindful Eating  Clinical staff led group instruction and group discussion with PowerPoint presentation and patient guidebook. To enhance the learning environment the use of posters, models and videos may be added. Patients will briefly review the concepts of the Pritikin Eating Plan and the importance of low-calorie dense foods. The concept of mindful eating will be introduced as well as the importance of paying attention to internal hunger signals. Triggers for non-hunger eating and techniques for dealing with triggers will be explored. The purpose of this lesson is to provide patients with the opportunity to review the basic principles of the Pritikin Eating Plan, discuss the value of eating mindfully and how to measure internal cues of hunger and fullness using the Hunger Scale. Patients will also discuss reasons for non-hunger eating and learn strategies to use for controlling emotional eating.  Targeting Your  Nutrition Priorities Clinical staff led group instruction and group discussion with PowerPoint presentation and patient guidebook. To enhance the learning environment the use of posters, models and videos may be added. Patients will learn how to determine their genetic susceptibility to disease by reviewing their family history. Patients will gain insight into the importance of diet as part of an overall healthy lifestyle in mitigating the impact of genetics and other environmental insults. The purpose of this lesson is to provide patients with the opportunity to assess their personal nutrition priorities by looking at their family history, their own health history and current risk factors. Patients will also be able to discuss ways of prioritizing and modifying the Pritikin Eating Plan for their highest risk areas  Menu  Clinical staff led  group instruction and group discussion with PowerPoint presentation and patient guidebook. To enhance the learning environment the use of posters, models and videos may be added. Using menus brought in from [employer], or printed from [employer], patients will apply the Pritikin dining out guidelines that were presented in the [employer] video. Patients will also be able to practice these guidelines in a variety of provided scenarios. The purpose of this lesson is to provide patients with the opportunity to practice hands-on learning of the Pritikin Dining Out guidelines with actual menus and practice scenarios.  Label Reading Clinical staff led group instruction and group discussion with PowerPoint presentation and patient guidebook. To enhance the learning environment the use of posters, models and videos may be added. Patients will review and discuss the Pritikin label reading guidelines presented in Pritikin's Label Reading Educational series video. Using fool labels brought in from local grocery stores and markets, patients will apply the  label reading guidelines and determine if the packaged food meet the Pritikin guidelines. The purpose of this lesson is to provide patients with the opportunity to review, discuss, and practice hands-on learning of the Pritikin Label Reading guidelines with actual packaged food labels. Cooking School  Pritikin's [employer] are designed to teach patients ways to prepare quick, simple, and affordable recipes at home. The importance of nutrition's role in chronic disease risk reduction is reflected in its emphasis in the overall Pritikin program. By learning how to prepare essential core Pritikin Eating Plan recipes, patients will increase control over what they eat; be able to customize the flavor of foods without the use of added salt, sugar, or fat; and improve the quality of the food they consume. By learning a set of core recipes which are easily assembled, quickly prepared, and affordable, patients are more likely to prepare more healthy foods at home. These workshops focus on convenient breakfasts, simple entres, side dishes, and desserts which can be prepared with minimal effort and are consistent with nutrition recommendations for cardiovascular risk reduction. Cooking [employer] are taught by a Armed forces logistics/support/administrative officer (RD) who has been trained by the [employer]. The chef or RD has a clear understanding of the importance of minimizing - if not completely eliminating - added fat, sugar, and sodium in recipes. Throughout the series of Cooking School Workshop sessions, patients will learn about healthy ingredients and efficient methods of cooking to build confidence in their capability to prepare    Cooking School weekly topics:  Adding Flavor- Sodium-Free  Fast and Healthy Breakfasts  Powerhouse Plant-Based Proteins  Satisfying Salads and Dressings  Simple Sides and Sauces  International Cuisine-Spotlight on the [employer] Zones  Delicious Desserts  Savory  Soups  [employer] - Meals in a Astronomer Appetizers and Snacks  Comforting Weekend Breakfasts  One-Pot Wonders   Fast Evening Meals  Landscape architect Your Pritikin Plate  WORKSHOPS   Healthy Mindset (Psychosocial):  Focused Goals, Sustainable Changes Clinical staff led group instruction and group discussion with PowerPoint presentation and patient guidebook. To enhance the learning environment the use of posters, models and videos may be added. Patients will be able to apply effective goal setting strategies to establish at least one personal goal, and then take consistent, meaningful action toward that goal. They will learn to identify common barriers to achieving personal goals and develop strategies to overcome them. Patients will also gain an understanding of how our mind-set can impact our ability  to achieve goals and the importance of cultivating a positive and growth-oriented mind-set. The purpose of this lesson is to provide patients with a deeper understanding of how to set and achieve personal goals, as well as the tools and strategies needed to overcome common obstacles which may arise along the way.  From Head to Heart: The Power of a Healthy Outlook  Clinical staff led group instruction and group discussion with PowerPoint presentation and patient guidebook. To enhance the learning environment the use of posters, models and videos may be added. Patients will be able to recognize and describe the impact of emotions and mood on physical health. They will discover the importance of self-care and explore self-care practices which may work for them. Patients will also learn how to utilize the 4 C's to cultivate a healthier outlook and better manage stress and challenges. The purpose of this lesson is to demonstrate to patients how a healthy outlook is an essential part of maintaining good health, especially as they continue their cardiac rehab journey.  Healthy  Sleep for a Healthy Heart Clinical staff led group instruction and group discussion with PowerPoint presentation and patient guidebook. To enhance the learning environment the use of posters, models and videos may be added. At the conclusion of this workshop, patients will be able to demonstrate knowledge of the importance of sleep to overall health, well-being, and quality of life. They will understand the symptoms of, and treatments for, common sleep disorders. Patients will also be able to identify daytime and nighttime behaviors which impact sleep, and they will be able to apply these tools to help manage sleep-related challenges. The purpose of this lesson is to provide patients with a general overview of sleep and outline the importance of quality sleep. Patients will learn about a few of the most common sleep disorders. Patients will also be introduced to the concept of "sleep hygiene," and discover ways to self-manage certain sleeping problems through simple daily behavior changes. Finally, the workshop will motivate patients by clarifying the links between quality sleep and their goals of heart-healthy living.   Recognizing and Reducing Stress Clinical staff led group instruction and group discussion with PowerPoint presentation and patient guidebook. To enhance the learning environment the use of posters, models and videos may be added. At the conclusion of this workshop, patients will be able to understand the types of stress reactions, differentiate between acute and chronic stress, and recognize the impact that chronic stress has on their health. They will also be able to apply different coping mechanisms, such as reframing negative self-talk. Patients will have the opportunity to practice a variety of stress management techniques, such as deep abdominal breathing, progressive muscle relaxation, and/or guided imagery.  The purpose of this lesson is to educate patients on the role of stress in their  lives and to provide healthy techniques for coping with it.  Learning Barriers/Preferences:  Learning Barriers/Preferences - 06/10/23 1115       Learning Barriers/Preferences   Learning Barriers Sight   contacts/glasses   Learning Preferences Audio;Computer/Internet;Group Instruction;Individual Instruction;Pictoral;Skilled Demonstration;Verbal Instruction;Video;Written Material             Education Topics:  Knowledge Questionnaire Score:  Knowledge Questionnaire Score - 06/10/23 1115       Knowledge Questionnaire Score   Pre Score 22/24             Core Components/Risk Factors/Patient Goals at Admission:  Personal Goals and Risk Factors at Admission - 06/10/23 1115  Core Components/Risk Factors/Patient Goals on Admission    Weight Management Yes;Weight Loss    Intervention Weight Management: Develop a combined nutrition and exercise program designed to reach desired caloric intake, while maintaining appropriate intake of nutrient and fiber, sodium and fats, and appropriate energy expenditure required for the weight goal.;Weight Management: Provide education and appropriate resources to help participant work on and attain dietary goals.    Expected Outcomes Long Term: Adherence to nutrition and physical activity/exercise program aimed toward attainment of established weight goal;Short Term: Continue to assess and modify interventions until short term weight is achieved;Weight Loss: Understanding of general recommendations for a balanced deficit meal plan, which promotes 1-2 lb weight loss per week and includes a negative energy balance of [phone] kcal/d;Understanding recommendations for meals to include 15-35% energy as protein, 25-35% energy from fat, 35-60% energy from carbohydrates, less than 200mg  of dietary cholesterol, 20-35 gm of total fiber daily;Understanding of distribution of calorie intake throughout the day with the consumption of 4-5 meals/snacks     Hypertension Yes    Intervention Provide education on lifestyle modifcations including regular physical activity/exercise, weight management, moderate sodium restriction and increased consumption of fresh fruit, vegetables, and low fat dairy, alcohol moderation, and smoking cessation.;Monitor prescription use compliance.    Expected Outcomes Short Term: Continued assessment and intervention until BP is < 140/69mm HG in hypertensive participants. < 130/82mm HG in hypertensive participants with diabetes, heart failure or chronic kidney disease.;Long Term: Maintenance of blood pressure at goal levels.    Lipids Yes    Intervention Provide education and support for participant on nutrition & aerobic/resistive exercise along with prescribed medications to achieve LDL 70mg , HDL >40mg .    Expected Outcomes Short Term: Participant states understanding of desired cholesterol values and is compliant with medications prescribed. Participant is following exercise prescription and nutrition guidelines.;Long Term: Cholesterol controlled with medications as prescribed, with individualized exercise RX and with personalized nutrition plan. Value goals: LDL < 70mg , HDL > 40 mg.    Stress Yes    Intervention Offer individual and/or small group education and counseling on adjustment to heart disease, stress management and health-related lifestyle change. Teach and support self-help strategies.;Refer participants experiencing significant psychosocial distress to appropriate mental health specialists for further evaluation and treatment. When possible, include family members and significant others in education/counseling sessions.    Expected Outcomes Short Term: Participant demonstrates changes in health-related behavior, relaxation and other stress management skills, ability to obtain effective social support, and compliance with psychotropic medications if prescribed.;Long Term: Emotional wellbeing is indicated by absence of  clinically significant psychosocial distress or social isolation.             Core Components/Risk Factors/Patient Goals Review:   Goals and Risk Factor Review     Row Name 06/15/23 1156 06/30/23 1302           Core Components/Risk Factors/Patient Goals Review   Personal Goals Review Weight Management/Obesity;Lipids;Hypertension;Stress Weight Management/Obesity;Lipids;Hypertension;Stress      Review Nashea started cardiac rehab on 06/15/23. Ulah did well with exercise. Vital signs were stable. Heartlee is off to a good start to exercise. Vital signs have been  stable. Suraiya has gained 2.3 kg since starting cardiac rehab.      Expected Outcomes Tasi will continue to participate in cardiac rehab for exercise, nutrtion and lifestyle modifications Lakerria will continue to participate in cardiac rehab for exercise, nutrtion and lifestyle modifications               Core  Components/Risk Factors/Patient Goals at Discharge (Final Review):   Goals and Risk Factor Review - 06/30/23 1302       Core Components/Risk Factors/Patient Goals Review   Personal Goals Review Weight Management/Obesity;Lipids;Hypertension;Stress    Review Skylier is off to a good start to exercise. Vital signs have been  stable. Lyrae has gained 2.3 kg since starting cardiac rehab.    Expected Outcomes Azelin will continue to participate in cardiac rehab for exercise, nutrtion and lifestyle modifications             ITP Comments:  ITP Comments     Row Name 06/10/23 1106 06/15/23 1154 06/30/23 1255       ITP Comments Dr. Armanda Magic medical director. Introduction to pritikin education/intensive cardiac rehab. Initial orientation packet reviewed with patient. 30 Day ITP Review. Sheelah started cardiac rehab on 06/15/23. Miyanah did well with exercise. 30 Day ITP Review. Conita is off to a good start to exercise at cardiac rehab..              Comments: See ITP Comments

## 2023-07-01 ENCOUNTER — Encounter (HOSPITAL_COMMUNITY)
Admission: RE | Admit: 2023-07-01 | Discharge: 2023-07-01 | Disposition: A | Discharge status: Home / Self Care | Payer: Medicare Other | Source: Ambulatory Visit | Attending: Cardiology | Admitting: Cardiology

## 2023-07-01 DIAGNOSIS — Z951 Presence of aortocoronary bypass graft: Secondary | ICD-10-CM

## 2023-07-01 DIAGNOSIS — Z952 Presence of prosthetic heart valve: Secondary | ICD-10-CM

## 2023-07-03 ENCOUNTER — Encounter (HOSPITAL_COMMUNITY)
Admission: RE | Admit: 2023-07-03 | Discharge: 2023-07-03 | Disposition: A | Discharge status: Home / Self Care | Payer: Medicare Other | Source: Ambulatory Visit | Attending: Cardiology | Admitting: Cardiology

## 2023-07-03 DIAGNOSIS — Z951 Presence of aortocoronary bypass graft: Secondary | ICD-10-CM | POA: Diagnosis not present

## 2023-07-03 DIAGNOSIS — Z952 Presence of prosthetic heart valve: Secondary | ICD-10-CM | POA: Diagnosis not present

## 2023-07-05 NOTE — Progress Notes (Unsigned)
Cardiology Office Note:    Date:  07/08/2023   ID:  [name], [name] 11/05/1955, MRN [id number]  PCP:  Aliene Beams, MD  Cardiologist:  Little Ishikawa, MD  Electrophysiologist:  None   Referring MD: Aliene Beams, MD   Chief Complaint  Patient presents with   Congestive Heart Failure     History of Present Illness:    [name] is a 67 y.o. female with a hx of breast cancer, hyperlipidemia who presents for follow-up.  She was referred by Dr. Tracie Harrier for evaluation of heart murmur, initially seen on 10/24/2019.  She reports that she followed at [facility] as a child, was told she had an issue with her aortic valve.  She had a heart catheterization done in middle school.  States that she followed with cardiology there until college, has not been seen for 40 years.  Reports that she was told that everything resolved and she did not need further cardiology follow-up.  She denies any chest pain or dyspnea.  States that she walks about twice per week, usually from 45 minutes to an hour.  Prior to pandemic was walking for 1 to 2 hours.  She denies any lightheadedness, syncope, or palpitations.  Does report occasional lower extremity edema.  No history of smoking.  Family history includes mother died of MI at age 32, and brother had CABG/AVR and died at age 58.  TTE 2007/02/08 showed LVEF 55%, mild LV dilatation, mild MR, mild AI.  TTE on 11/14/2019 showed LVEF 65 to 70%, indeterminate diastolic filling, normal RV systolic function, mild aortic stenosis, mild aortic regurgitation, mild mitral regurgitation.  Echocardiogram on 01/16/2021 showed EF 40 to 45%, mild RV dysfunction, moderate to severe aortic stenosis (V-max 3.0 m/s, mean gradient 20 mmHg, AVA 0.8 cm, DI 0.24). LHC/RHC on 02/13/21 showed no evidence of CAD, moderate to severe aortic stenosis (mean gradient 27 mmHg, AVA 1.1 cm), RA 1, RV 19/0, PA 20/8/12, PW CP 8, LVEDP 8, CI 3.2.  CTA shows tricuspid aortic valve with  partial fusion of RCC/LCC, possibly a forme fruste bicuspid aortic valve.  Echocardiogram on 07/16/2021 showed EF had improved to 65 to 70% and aortic stenosis was mild to moderate (V-max 2.8 m/s, mean gradient 18 mmHg, AVA 1.3 cm).  Echocardiogram 06/2022 showed an EF 60 to 65%, normal RV function, moderate to severe aortic stenosis (Vmax 3.0 m/s, MG 19 mmHg, AVA 0.8 cm^2, DI 0.27).  Echocardiogram 10/2022 showed severe aortic stenosis (mean gradient 26 mmHg, AVA 0.8 cm, DI 0.25), EF 55 to 60%, normal RV function.  She was found to have heart block and underwent Zio patch x 3 days which showed 2030 episodes of AV block (second-degree Mobitz 2, high-grade and third-degree). Had third-degree AV block with heart rate down to 36 bpm.  Underwent PPM placement with Dr. Graciela Husbands on 01/21/2023.  LHC 04/14/2023 showed severe mid LAD stenosis.  On 05/06/2023 she underwent aortic valve replacement with 21 mm Inspiris pericardial valve and CABG x 1 with LIMA-LAD.  Echocardiogram 06/17/2023 shows EF 40 to 45%, moderate LV dilatation, normal RV function, mild to moderate mitral regurgitation, severe tricuspid regurgitation,, normal functioning prosthetic aortic valve.  Since last clinic visit, she reports she has been doing well.  She has been doing cardiac rehab.  She denies any chest pain.  Does report some discomfort with deep breathing.  She denies any lightheadedness or syncope.  Denies any lower extremity edema or palpitations.  BP Readings from Last 3  Encounters:  07/06/23 104/68  06/10/23 108/66  06/04/23 119/74     Past Medical History:  Diagnosis Date   Anxiety    Aortic valve stenosis    Arthritis    Breast cancer (HCC) 2004   right breast; ER/PR+, Her2+   Cancer (HCC) 10/2002   right breast lumpectomy   Coronary artery disease    Dysrhythmia    2nd Degree AV block   GERD (gastroesophageal reflux disease)    Heart murmur    History of kidney stones    HX   HTN (hypertension)    Hyperlipidemia     Morton neuroma    Personal history of chemotherapy 11/19/2002   Personal history of radiation therapy 02/19/2003   Pneumonia    Pre-diabetes    Presence of permanent cardiac pacemaker 01/21/2023   Medtronic    Past Surgical History:  Procedure Laterality Date   AORTIC VALVE REPLACEMENT N/A 05/06/2023   Procedure: AORTIC VALVE REPLACEMENT (AVR) USING 21 MM INSPIRIS RESILIA AORTIC VALVE;  Surgeon: Eugenio Hoes, MD;  Location: [facility] OR;  Service: Open Heart Surgery;  Laterality: N/A;   BREAST BIOPSY Right 2019   BREAST LUMPECTOMY Right    BREAST SURGERY  10/2002   lumpectomy / right side   COLONOSCOPY  2016   CORONARY ARTERY BYPASS GRAFT N/A 05/06/2023   Procedure: CORONARY ARTERY BYPASS GRAFTING (CABG) X ONE USING LEFT INTERNAL MAMMARY ARTERY;  Surgeon: Eugenio Hoes, MD;  Location: [facility] OR;  Service: Open Heart Surgery;  Laterality: N/A;   PACEMAKER IMPLANT N/A 01/21/2023   Procedure: PACEMAKER IMPLANT;  Surgeon: Duke Salvia, MD;  Location: [facility] INVASIVE CV LAB;  Service: Cardiovascular;  Laterality: N/A;   RIGHT/LEFT HEART CATH AND CORONARY ANGIOGRAPHY N/A 02/13/2021   Procedure: RIGHT/LEFT HEART CATH AND CORONARY ANGIOGRAPHY;  Surgeon: Kathleene Hazel, MD;  Location: [facility] INVASIVE CV LAB;  Service: Cardiovascular;  Laterality: N/A;   RIGHT/LEFT HEART CATH AND CORONARY ANGIOGRAPHY N/A 04/14/2023   Procedure: RIGHT/LEFT HEART CATH AND CORONARY ANGIOGRAPHY;  Surgeon: [place], Peter M, MD;  Location: [facility] INVASIVE CV LAB;  Service: Cardiovascular;  Laterality: N/A;   TEE WITHOUT CARDIOVERSION N/A 05/06/2023   Procedure: TRANSESOPHAGEAL ECHOCARDIOGRAM;  Surgeon: Eugenio Hoes, MD;  Location: [facility] OR;  Service: Open Heart Surgery;  Laterality: N/A;    Current Medications: Current Meds  Medication Sig   acetaminophen (TYLENOL) 650 MG CR tablet Take 650 mg by mouth every 8 (eight) hours as needed for pain.   aspirin EC 81 MG tablet Take 1 tablet (81 mg total) by mouth daily. Swallow  whole.   Calcium Carbonate (CALCI-CHEW PO) Take 1 tablet by mouth in the morning.   calcium carbonate (TUMS EX) 750 MG chewable tablet Chew 2 tablets by mouth daily as needed for heartburn.   cholecalciferol (VITAMIN D3) 25 MCG (1000 UNIT) tablet Take 1,000 Units by mouth daily.   escitalopram (LEXAPRO) 10 MG tablet Take 10 mg by mouth daily.   losartan (COZAAR) 25 MG tablet Take 0.5 tablets (12.5 mg total) by mouth daily.   metoprolol succinate (TOPROL XL) 25 MG 24 hr tablet Take 1 tablet (25 mg total) by mouth daily.   Polyethyl Glycol-Propyl Glycol (LUBRICATING EYE DROPS OP) Place 1 drop into both eyes daily as needed (dry eyes).   rosuvastatin (CRESTOR) 10 MG tablet Take 10 mg by mouth daily.   traMADol (ULTRAM) 50 MG tablet Take 1 tablet (50 mg total) by mouth every 6 (six) hours as needed for moderate pain (pain score  4-6).   [DISCONTINUED] metoprolol tartrate (LOPRESSOR) 25 MG tablet Take 0.5 tablets (12.5 mg total) by mouth 2 (two) times daily.     Allergies:   Wound dressing adhesive   Social History   Socioeconomic History   Marital status: Divorced    Spouse name: Not on file   Number of children: 2   Years of education: Not on file   Highest education level: Not on file  Occupational History   Occupation: Retired-Customer Financial planner  Tobacco Use   Smoking status: Never   Smokeless tobacco: Never  Vaping Use   Vaping status: Never Used  Substance and Sexual Activity   Alcohol use: Not Currently    Alcohol/week: 3.0 standard drinks of alcohol    Types: 3 Standard drinks or equivalent per week   Drug use: No   Sexual activity: Yes  Other Topics Concern   Not on file  Social History Narrative   Not on file   Social Drivers of Health   Financial Resource Strain: Not on file  Food Insecurity: Not on file  Transportation Needs: Not on file  Physical Activity: Not on file  Stress: Not on file (09/01/2019)  Social Connections: Not on file     Family  History: The patient's family history includes Alzheimer's disease in her paternal aunt and paternal uncle; Bladder Cancer in her brother; Diabetes in her brother, maternal aunt, maternal uncle, and mother; Heart Problems in her maternal uncle; Heart attack in her maternal grandfather, paternal grandfather, and paternal grandmother; Heart disease in her brother, maternal aunt, mother, and paternal aunt; Kidney failure in her brother and mother; Lung cancer (age of onset: 36) in her father; Skin cancer in her father and paternal uncle. There is no history of Breast cancer.  ROS:   Please see the history of present illness.  All other systems reviewed and are negative.  EKGs/Labs/Other Studies Reviewed:    The following studies were reviewed today:   EKG:   04/07/2023: Normal sinus rhythm, rate 77 02/13/23: A-sesned, V-paced, rate 63 11/04/2022: Normal sinus rhythm, rate 71, LVH 05/13/22: Normal sinus rhythm, rate 82, LVH, Q waves in V1-3 08/06/21: Normal sinus rhythm, rate 70, LVH, left axis deviation 07/22:  NSR, rate 69, LVH 11/27/2020 (PCP Office): NSR, rate 69 bpm, LBBB 11/29/2019: EKG was not ordered.   10/24/2019: normal sinus rhythm, rate 73, Q waves in V1/2, no ST/T abnormalities  TTE 11/14/19:  1. Left ventricular ejection fraction, by estimation, is 65 to 70%. The  left ventricle has normal function. The left ventricle has no regional  wall motion abnormalities. Indeterminate diastolic filling due to E-A  fusion.   2. Right ventricular systolic function is normal. The right ventricular  size is normal. There is normal pulmonary artery systolic pressure.   3. The mitral valve is normal in structure. Mild mitral valve  regurgitation.   4. AV is thickened, calcified Peak and mean gradients through the valve  are 29 and 17 mm Hg respectively consistent with mild aortic stenosis..  The aortic valve is tricuspid. Aortic valve regurgitation is mild. Mild  aortic valve stenosis.   5. The  inferior vena cava is normal in size with greater than 50%  respiratory variability, suggesting right atrial pressure of 3 mmHg.   Recent Labs: 05/07/2023: Magnesium 2.0 07/06/2023: ALT 22; BUN 14; Creatinine, Ser 1.12; Hemoglobin 11.5; Platelets 378; Potassium 4.6; Sodium 139  Recent Lipid Panel    Component Value Date/Time   CHOL 60 05/08/2023 0500  TRIG 92 05/08/2023 0500   HDL 27 (L) 05/08/2023 0500   CHOLHDL 2.2 05/08/2023 0500   VLDL 18 05/08/2023 0500   LDLCALC 15 05/08/2023 0500    Physical Exam:    VS:  BP 104/68   Pulse 86   Ht 5' 3.5" (1.613 m)   Wt 158 lb (71.7 kg)   SpO2 95%   BMI 27.55 kg/m     Wt Readings from Last 3 Encounters:  07/06/23 158 lb (71.7 kg)  06/10/23 160 lb 11.5 oz (72.9 kg)  06/04/23 157 lb (71.2 kg)     GEN:  Well nourished, well developed in no acute distress HEENT: Normal NECK: No JVD CARDIAC: RRR,  2/6 systolic heart murmur loudest at the RUSB RESPIRATORY:  Clear to auscultation without rales, wheezing or rhonchi  ABDOMEN: Soft, non-tender, non-distended MUSCULOSKELETAL:  No edema; No deformity  SKIN: Warm and dry NEUROLOGIC:  Alert and oriented x 3 PSYCHIATRIC:  Normal affect   ASSESSMENT:    1. Chronic combined systolic and diastolic heart failure (HCC)   2. S/P AVR   3. Essential hypertension   4. Coronary artery disease involving native coronary artery of native heart without angina pectoris   5. S/P CABG x 1      PLAN:    Aortic stenosis s/p AVR: Echocardiogram on 01/16/2021 showed EF 40 to 45%, mild RV dysfunction, moderate to severe aortic stenosis (V-max 3.0 m/s, mean gradient 20 mmHg, AVA 0.8 cm, DI 0.24).  LHC/RHC on 02/13/21 showed no evidence of CAD, moderate to severe aortic stenosis (mean gradient 27 mmHg, AVA 1.1 cm).  CTA shows tricuspid aortic valve with partial fusion of RCC/LCC, possibly a forme fruste bicuspid aortic valve.  Echocardiogram on 07/16/2021 showed EF had improved to 65 to 70% and aortic  stenosis was mild to moderate (V-max 2.8 m/s, mean gradient 18 mmHg, AVA 1.3 cm).  Echocardiogram 06/2022 showed an EF 60 to 65%, normal RV function, moderate to severe aortic stenosis (Vmax 3.0 m/s, MG 19 mmHg, AVA 0.8 cm^2, DI 0.27).  Echocardiogram 10/2022 showed severe aortic stenosis (mean gradient 26 mmHg, AVA 0.8 cm, DI 0.25), EF 55 to 60%, normal RV function.   LHC 04/14/2023 showed severe mid LAD stenosis.  On 05/06/2023 she underwent aortic valve replacement with 21 mm Inspiris pericardial valve and CABG x 1 with LIMA-LAD.  Echocardiogram 06/17/2023 shows EF 40 to 45%, moderate LV dilatation, normal RV function, mild to moderate mitral regurgitation, severe tricuspid regurgitation, normal functioning prosthetic aortic valve.  Chronic combined systolic and diastolic heart failure:  new diagnosis.  Echocardiogram on 01/16/2021 showed EF 40 to 45%, mild RV dysfunction, moderate to severe aortic stenosis (V-max 3.0 m/s, mean gradient 20 mmHg, AVA 0.8 cm, DI 0.24).  LHC/RHC on 02/13/21 showed no evidence of CAD, moderate to severe aortic stenosis (mean gradient 27 mmHg, AVA 1.1 cm), RA 1, RV 19/0, PA 20/8/12, PW CP 8, LVEDP 8, CI 3.2.  CTA shows tricuspid aortic valve with partial fusion of RCC/LCC, possibly a forme fruste bicuspid aortic valve.  Echocardiogram on 07/16/2021 showed EF had improved to 65 to 70% and aortic stenosis was mild to moderate (V-max 2.8 m/s, mean gradient 18 mmHg, AVA 1.3 cm).  Echocardiogram 06/17/2023 shows EF 40 to 45%, moderate LV dilatation, normal RV function, mild to moderate mitral regurgitation, severe tricuspid regurgitation, normal functioning prosthetic aortic valve. -Currently on Lopressor 12.5 mg twice daily, will consolidate to Toprol-XL 25 mg daily -Was on losartan prior to surgery but discontinued  following surgery, will start losartan 12.5 mg daily.  Check CMET  CAD: LHC 04/14/2023 showed severe mid LAD stenosis.  Status post CABG with LIMA-LAD  05/06/2023 -Continue aspirin 81 mg daily -Continue rosuvastatin 10 mg daily  Heart block: She was found to have heart block and underwent Zio patch x 3 days which showed 2030 episodes of AV block (second-degree Mobitz 2, high-grade and third-degree). Had third-degree AV block with heart rate down to 36 bpm.  Suspect due to her severe calcific aortic stenosis.  Underwent PPM placement with Dr. Graciela Husbands on 01/21/2023.  Hyperlipidemia: LDL 148 on 11/29/21.  Started rosuvastatin 10 mg daily, LDL 15 on 05/08/2023  Hypertension: On metoprolol as above, will consolidate to Toprol-XL and add losartan.  BP soft in clinic today, will monitor    RTC in 1 month   Medication Adjustments/Labs and Tests Ordered: Current medicines are reviewed at length with the patient today.  Concerns regarding medicines are outlined above.  Orders Placed This Encounter  Procedures   Comprehensive Metabolic Panel (CMET)   CBC w/Diff/Platelet   EKG 12-Lead     Meds ordered this encounter  Medications   metoprolol succinate (TOPROL XL) 25 MG 24 hr tablet    Sig: Take 1 tablet (25 mg total) by mouth daily.    Dispense:  90 tablet    Refill:  3   losartan (COZAAR) 25 MG tablet    Sig: Take 0.5 tablets (12.5 mg total) by mouth daily.    Dispense:  90 tablet    Refill:  3       Patient Instructions  Medication Instructions:  Stop Lopressor Start  Toprol XL 25 mg daily Start Losartan 12.5 mg daily *If you need a refill on your cardiac medications before your next appointment, please call your pharmacy*   Lab Work: CMET, CBC today If you have labs (blood work) drawn today and your tests are completely normal, you will receive your results only by: MyChart Message (if you have MyChart) OR A paper copy in the mail If you have any lab test that is abnormal or we need to change your treatment, we will call you to review the results.   Testing/Procedures: none   Follow-Up: At [facility], you and  your health needs are our priority.  As part of our continuing mission to provide you with exceptional heart care, we have created designated Provider Care Teams.  These Care Teams include your primary Cardiologist (physician) and Advanced Practice Providers (APPs -  Physician Assistants and Nurse Practitioners) who all work together to provide you with the care you need, when you need it.  We recommend signing up for the patient portal called "MyChart".  Sign up information is provided on this After Visit Summary.  MyChart is used to connect with patients for Virtual Visits (Telemedicine).  Patients are able to view lab/test results, encounter notes, upcoming appointments, etc.  Non-urgent messages can be sent to your provider as well.   To learn more about what you can do with MyChart, go to [email or web address].    Your next appointment:   Jul 27 2023 @ 4pm  Provider:   Little Ishikawa, MD     Other Instructions none     Signed, Little Ishikawa, MD  07/08/2023 11:46 AM    [facility]

## 2023-07-06 ENCOUNTER — Encounter: Payer: Self-pay | Admitting: Cardiology

## 2023-07-06 ENCOUNTER — Telehealth (HOSPITAL_COMMUNITY): Payer: Self-pay | Admitting: *Deleted

## 2023-07-06 ENCOUNTER — Encounter (HOSPITAL_COMMUNITY): Admission: RE | Admit: 2023-07-06 | Payer: Medicare Other | Source: Ambulatory Visit

## 2023-07-06 ENCOUNTER — Ambulatory Visit: Payer: Medicare Other | Attending: Cardiology | Admitting: Cardiology

## 2023-07-06 VITALS — BP 104/68 | HR 86 | Ht 63.5 in | Wt 158.0 lb

## 2023-07-06 DIAGNOSIS — I10 Essential (primary) hypertension: Secondary | ICD-10-CM | POA: Diagnosis not present

## 2023-07-06 DIAGNOSIS — I2510 Atherosclerotic heart disease of native coronary artery without angina pectoris: Secondary | ICD-10-CM

## 2023-07-06 DIAGNOSIS — I5042 Chronic combined systolic (congestive) and diastolic (congestive) heart failure: Secondary | ICD-10-CM

## 2023-07-06 DIAGNOSIS — Z952 Presence of prosthetic heart valve: Secondary | ICD-10-CM

## 2023-07-06 DIAGNOSIS — Z951 Presence of aortocoronary bypass graft: Secondary | ICD-10-CM

## 2023-07-06 MED ORDER — METOPROLOL SUCCINATE ER 25 MG PO TB24: 25.0000 mg | ORAL_TABLET | Freq: Every day | ORAL | 3 refills | Status: DC

## 2023-07-06 MED ORDER — LOSARTAN POTASSIUM 25 MG PO TABS: 12.5000 mg | ORAL_TABLET | Freq: Every day | ORAL | 3 refills | Status: DC

## 2023-07-06 NOTE — Patient Instructions (Addendum)
Medication Instructions:  Stop Lopressor Start  Toprol XL 25 mg daily Start Losartan 12.5 mg daily *If you need a refill on your cardiac medications before your next appointment, please call your pharmacy*   Lab Work: CMET, CBC today If you have labs (blood work) drawn today and your tests are completely normal, you will receive your results only by: MyChart Message (if you have MyChart) OR A paper copy in the mail If you have any lab test that is abnormal or we need to change your treatment, we will call you to review the results.   Testing/Procedures: none   Follow-Up: At [facility], you and your health needs are our priority.  As part of our continuing mission to provide you with exceptional heart care, we have created designated Provider Care Teams.  These Care Teams include your primary Cardiologist (physician) and Advanced Practice Providers (APPs -  Physician Assistants and Nurse Practitioners) who all work together to provide you with the care you need, when you need it.  We recommend signing up for the patient portal called "MyChart".  Sign up information is provided on this After Visit Summary.  MyChart is used to connect with patients for Virtual Visits (Telemedicine).  Patients are able to view lab/test results, encounter notes, upcoming appointments, etc.  Non-urgent messages can be sent to your provider as well.   To learn more about what you can do with MyChart, go to [email or web address].    Your next appointment:   Jul 27 2023 @ 4pm  Provider:   Little Ishikawa, MD     Other Instructions none

## 2023-07-06 NOTE — Telephone Encounter (Signed)
Pt called and left message on departmental voicemail.  Pt will be absent from cardiac rehab - 12/16 feeling sick. No elaboration regarding any symptoms.  [facility] staff made aware. Alanson Aly, BSN Cardiac and Emergency planning/management officer

## 2023-07-06 NOTE — Telephone Encounter (Signed)
Called spoke to patient. She states she has the answer in regards to dental cleaning.   Patient states she had a question about taking Lopressor  She said she is not taking.  RN informed patient this is the brand name for Metoprolol tartrate. RN asked if she has medication bottle that has this name on it "  Metoprolol  tartrate"  Patient reviewed her medication bottles . She states see the name on the bottle, she is taking medication   Patient  is aware to stop taking medication and start taking the new prescription for Metoprolol succinate 25 mg daily

## 2023-07-07 ENCOUNTER — Telehealth: Payer: Self-pay | Admitting: *Deleted

## 2023-07-07 DIAGNOSIS — I5042 Chronic combined systolic (congestive) and diastolic (congestive) heart failure: Secondary | ICD-10-CM

## 2023-07-07 DIAGNOSIS — I10 Essential (primary) hypertension: Secondary | ICD-10-CM

## 2023-07-07 LAB — COMPREHENSIVE METABOLIC PANEL
ALT: 22 [IU]/L (ref 0–32)
AST: 23 [IU]/L (ref 0–40)
Albumin: 4.8 g/dL (ref 3.9–4.9)
Alkaline Phosphatase: 68 [IU]/L (ref 44–121)
BUN/Creatinine Ratio: 13 (ref 12–28)
BUN: 14 mg/dL (ref 8–27)
Bilirubin Total: 0.7 mg/dL (ref 0.0–1.2)
CO2: 23 mmol/L (ref 20–29)
Calcium: 10 mg/dL (ref 8.7–10.3)
Chloride: 101 mmol/L (ref 96–106)
Creatinine, Ser: 1.12 mg/dL — ABNORMAL HIGH (ref 0.57–1.00)
Globulin, Total: 3.2 g/dL (ref 1.5–4.5)
Glucose: 94 mg/dL (ref 70–99)
Potassium: 4.6 mmol/L (ref 3.5–5.2)
Sodium: 139 mmol/L (ref 134–144)
Total Protein: 8 g/dL (ref 6.0–8.5)
eGFR: 54 mL/min/{1.73_m2} — ABNORMAL LOW (ref >59)

## 2023-07-07 LAB — CBC WITH DIFFERENTIAL/PLATELET
Basophils Absolute: 0.1 10*3/uL (ref 0.0–0.2)
Basos: 1 %
EOS (ABSOLUTE): 0.3 10*3/uL (ref 0.0–0.4)
Eos: 3 %
Hematocrit: 38 % (ref 34.0–46.6)
Hemoglobin: 11.5 g/dL (ref 11.1–15.9)
Immature Grans (Abs): 0 10*3/uL (ref 0.0–0.1)
Immature Granulocytes: 0 %
Lymphocytes Absolute: 1.5 10*3/uL (ref 0.7–3.1)
Lymphs: 16 %
MCH: 25.4 pg — ABNORMAL LOW (ref 26.6–33.0)
MCHC: 30.3 g/dL — ABNORMAL LOW (ref 31.5–35.7)
MCV: 84 fL (ref 79–97)
Monocytes Absolute: 0.8 10*3/uL (ref 0.1–0.9)
Monocytes: 8 %
Neutrophils Absolute: 6.7 10*3/uL (ref 1.4–7.0)
Neutrophils: 72 %
Platelets: 378 10*3/uL (ref 150–450)
RBC: 4.52 x10E6/uL (ref 3.77–5.28)
RDW: 14.1 % (ref 11.7–15.4)
WBC: 9.5 10*3/uL (ref 3.4–10.8)

## 2023-07-07 NOTE — Telephone Encounter (Signed)
Spoke to patient and lab work for [employer] in one week ordered and patient made aware and voices an understanding.

## 2023-07-08 ENCOUNTER — Encounter (HOSPITAL_COMMUNITY): Payer: Medicare Other

## 2023-07-08 ENCOUNTER — Other Ambulatory Visit (HOSPITAL_COMMUNITY): Payer: Medicare Other

## 2023-07-09 ENCOUNTER — Encounter (HOSPITAL_COMMUNITY): Payer: Self-pay

## 2023-07-09 ENCOUNTER — Ambulatory Visit (HOSPITAL_COMMUNITY): Payer: Medicare Other

## 2023-07-10 ENCOUNTER — Encounter (HOSPITAL_COMMUNITY)
Admission: RE | Admit: 2023-07-10 | Discharge: 2023-07-10 | Disposition: A | Discharge status: Home / Self Care | Payer: Medicare Other | Source: Ambulatory Visit | Attending: Cardiology | Admitting: Cardiology

## 2023-07-10 DIAGNOSIS — Z952 Presence of prosthetic heart valve: Secondary | ICD-10-CM

## 2023-07-10 DIAGNOSIS — Z951 Presence of aortocoronary bypass graft: Secondary | ICD-10-CM

## 2023-07-13 ENCOUNTER — Encounter (HOSPITAL_COMMUNITY)
Admission: RE | Admit: 2023-07-13 | Discharge: 2023-07-13 | Disposition: A | Discharge status: Home / Self Care | Payer: Medicare Other | Source: Ambulatory Visit | Attending: Cardiology | Admitting: Cardiology

## 2023-07-13 DIAGNOSIS — Z951 Presence of aortocoronary bypass graft: Secondary | ICD-10-CM

## 2023-07-13 DIAGNOSIS — Z952 Presence of prosthetic heart valve: Secondary | ICD-10-CM

## 2023-07-14 ENCOUNTER — Other Ambulatory Visit: Payer: Self-pay

## 2023-07-14 DIAGNOSIS — I5042 Chronic combined systolic (congestive) and diastolic (congestive) heart failure: Secondary | ICD-10-CM

## 2023-07-14 DIAGNOSIS — I10 Essential (primary) hypertension: Secondary | ICD-10-CM

## 2023-07-14 LAB — BASIC METABOLIC PANEL
BUN/Creatinine Ratio: 14 (ref 12–28)
BUN: 13 mg/dL (ref 8–27)
CO2: 23 mmol/L (ref 20–29)
Calcium: 9.9 mg/dL (ref 8.7–10.3)
Chloride: 102 mmol/L (ref 96–106)
Creatinine, Ser: 0.92 mg/dL (ref 0.57–1.00)
Glucose: 134 mg/dL — ABNORMAL HIGH (ref 70–99)
Potassium: 4.5 mmol/L (ref 3.5–5.2)
Sodium: 141 mmol/L (ref 134–144)
eGFR: 68 mL/min/{1.73_m2} (ref >59)

## 2023-07-17 ENCOUNTER — Encounter (HOSPITAL_COMMUNITY)
Admission: RE | Admit: 2023-07-17 | Discharge: 2023-07-17 | Disposition: A | Discharge status: Home / Self Care | Payer: Medicare Other | Source: Ambulatory Visit | Attending: Cardiology | Admitting: Cardiology

## 2023-07-17 DIAGNOSIS — Z952 Presence of prosthetic heart valve: Secondary | ICD-10-CM

## 2023-07-17 DIAGNOSIS — Z951 Presence of aortocoronary bypass graft: Secondary | ICD-10-CM | POA: Diagnosis not present

## 2023-07-20 ENCOUNTER — Encounter (HOSPITAL_COMMUNITY)
Admission: RE | Admit: 2023-07-20 | Discharge: 2023-07-20 | Disposition: A | Discharge status: Home / Self Care | Payer: Medicare Other | Source: Ambulatory Visit | Attending: Cardiology | Admitting: Cardiology

## 2023-07-20 DIAGNOSIS — Z952 Presence of prosthetic heart valve: Secondary | ICD-10-CM

## 2023-07-20 DIAGNOSIS — Z951 Presence of aortocoronary bypass graft: Secondary | ICD-10-CM | POA: Diagnosis not present

## 2023-07-21 ENCOUNTER — Encounter (INDEPENDENT_AMBULATORY_CARE_PROVIDER_SITE_OTHER): Payer: Medicare Other

## 2023-07-21 DIAGNOSIS — I441 Atrioventricular block, second degree: Secondary | ICD-10-CM

## 2023-07-22 LAB — CUP PACEART REMOTE DEVICE CHECK
Battery Remaining Longevity: 120 mo
Battery Voltage: 3.07 V
Brady Statistic AP VP Percent: 62.15 %
Brady Statistic AP VS Percent: 0.01 %
Brady Statistic AS VP Percent: 37.81 %
Brady Statistic AS VS Percent: 0.03 %
Brady Statistic RA Percent Paced: 62.14 %
Brady Statistic RV Percent Paced: 99.96 %
Date Time Interrogation Session: 20241231123246
Implantable Lead Connection Status: 753985
Implantable Lead Connection Status: 753985
Implantable Lead Implant Date: 20240703
Implantable Lead Implant Date: 20240703
Implantable Lead Location: 753859
Implantable Lead Location: 753860
Implantable Lead Model: 3830
Implantable Lead Model: 5076
Implantable Pulse Generator Implant Date: 20240703
Lead Channel Impedance Value: 266 Ohm
Lead Channel Impedance Value: 399 Ohm
Lead Channel Impedance Value: 399 Ohm
Lead Channel Impedance Value: 589 Ohm
Lead Channel Pacing Threshold Amplitude: 1.125 V
Lead Channel Pacing Threshold Amplitude: 1.25 V
Lead Channel Pacing Threshold Pulse Width: 0.4 ms
Lead Channel Pacing Threshold Pulse Width: 0.4 ms
Lead Channel Sensing Intrinsic Amplitude: 1.125 mV
Lead Channel Sensing Intrinsic Amplitude: 1.125 mV
Lead Channel Sensing Intrinsic Amplitude: 11.25 mV
Lead Channel Sensing Intrinsic Amplitude: 11.25 mV
Lead Channel Setting Pacing Amplitude: 2.5 V
Lead Channel Setting Pacing Amplitude: 2.75 V
Lead Channel Setting Pacing Pulse Width: 0.4 ms
Lead Channel Setting Sensing Sensitivity: 1.2 mV
Zone Setting Status: 755011

## 2023-07-24 ENCOUNTER — Encounter: Payer: Self-pay | Admitting: Internal Medicine

## 2023-07-24 ENCOUNTER — Encounter (HOSPITAL_COMMUNITY): Payer: Medicare Other

## 2023-07-24 ENCOUNTER — Encounter (HOSPITAL_COMMUNITY)
Admission: RE | Admit: 2023-07-24 | Discharge: 2023-07-24 | Disposition: A | Discharge status: Home / Self Care | Payer: Medicare Other | Source: Ambulatory Visit | Attending: Cardiology | Admitting: Cardiology

## 2023-07-24 DIAGNOSIS — Z951 Presence of aortocoronary bypass graft: Secondary | ICD-10-CM | POA: Diagnosis not present

## 2023-07-24 DIAGNOSIS — Z952 Presence of prosthetic heart valve: Secondary | ICD-10-CM | POA: Insufficient documentation

## 2023-07-24 DIAGNOSIS — Z48812 Encounter for surgical aftercare following surgery on the circulatory system: Secondary | ICD-10-CM | POA: Diagnosis not present

## 2023-07-26 NOTE — Progress Notes (Signed)
 Cardiology Office Note:    Date:  07/27/2023   ID:  Loryn, Haacke [birth year], MRN [id number]  PCP:  Rolinda Millman, MD  Cardiologist:  Lonni LITTIE Nanas, MD  Electrophysiologist:  None   Referring MD: Rolinda Millman, MD   Chief Complaint  Patient presents with   Congestive Heart Failure     History of Present Illness:    [name] is a 68 y.o. female with a hx of breast cancer, hyperlipidemia who presents for follow-up.  She was referred by Dr. Rolinda for evaluation of heart murmur, initially seen on 10/24/2019.  She reports that she followed at [facility] as a child, was told she had an issue with her aortic valve.  She had a heart catheterization done in middle school.  States that she followed with cardiology there until college, has not been seen for 40 years.  Reports that she was told that everything resolved and she did not need further cardiology follow-up.  She denies any chest pain or dyspnea.  States that she walks about twice per week, usually from 45 minutes to an hour.  Prior to pandemic was walking for 1 to 2 hours.  She denies any lightheadedness, syncope, or palpitations.  Does report occasional lower extremity edema.  No history of smoking.  Family history includes mother died of MI at age 38, and brother had CABG/AVR and died at age 5.  TTE 08-Feb-2007 showed LVEF 55%, mild LV dilatation, mild MR, mild AI.  TTE on 11/14/2019 showed LVEF 65 to 70%, indeterminate diastolic filling, normal RV systolic function, mild aortic stenosis, mild aortic regurgitation, mild mitral regurgitation.  Echocardiogram on 01/16/2021 showed EF 40 to 45%, mild RV dysfunction, moderate to severe aortic stenosis (V-max 3.0 m/s, mean gradient 20 mmHg, AVA 0.8 cm, DI 0.24). LHC/RHC on 02/13/21 showed no evidence of CAD, moderate to severe aortic stenosis (mean gradient 27 mmHg, AVA 1.1 cm), RA 1, RV 19/0, PA 20/8/12, PW CP 8, LVEDP 8, CI 3.2.  CTA shows tricuspid aortic valve with  partial fusion of RCC/LCC, possibly a forme fruste bicuspid aortic valve.  Echocardiogram on 07/16/2021 showed EF had improved to 65 to 70% and aortic stenosis was mild to moderate (V-max 2.8 m/s, mean gradient 18 mmHg, AVA 1.3 cm).  Echocardiogram 06/2022 showed an EF 60 to 65%, normal RV function, moderate to severe aortic stenosis (Vmax 3.0 m/s, MG 19 mmHg, AVA 0.8 cm^2, DI 0.27).  Echocardiogram 10/2022 showed severe aortic stenosis (mean gradient 26 mmHg, AVA 0.8 cm, DI 0.25), EF 55 to 60%, normal RV function.  She was found to have heart block and underwent Zio patch x 3 days which showed 2030 episodes of AV block (second-degree Mobitz 2, high-grade and third-degree). Had third-degree AV block with heart rate down to 36 bpm.  Underwent PPM placement with Dr. Fernande on 01/21/2023.  LHC 04/14/2023 showed severe mid LAD stenosis.  On 05/06/2023 she underwent aortic valve replacement with 21 mm Inspiris pericardial valve and CABG x 1 with LIMA-LAD.  Echocardiogram 06/17/2023 shows EF 40 to 45%, moderate LV dilatation, normal RV function, mild to moderate mitral regurgitation, severe tricuspid regurgitation,, normal functioning prosthetic aortic valve.  Since last clinic visit, she reports she is doing well.  Had some lightheadedness a few mornings but denies any syncope.  She is doing cardiac rehab.  She denies any chest pain, dyspnea, lower extremity edema, or palpitations.  BP Readings from Last 3 Encounters:  07/27/23 118/62  07/06/23 104/68  06/10/23 108/66     Past Medical History:  Diagnosis Date   Anxiety    Aortic valve stenosis    Arthritis    Breast cancer (HCC) 2004   right breast; ER/PR+, Her2+   Cancer (HCC) 10/2002   right breast lumpectomy   Coronary artery disease    Dysrhythmia    2nd Degree AV block   GERD (gastroesophageal reflux disease)    Heart murmur    History of kidney stones    HX   HTN (hypertension)    Hyperlipidemia    Morton neuroma    Personal history of  chemotherapy 11/19/2002   Personal history of radiation therapy 02/19/2003   Pneumonia    Pre-diabetes    Presence of permanent cardiac pacemaker 01/21/2023   Medtronic    Past Surgical History:  Procedure Laterality Date   AORTIC VALVE REPLACEMENT N/A 05/06/2023   Procedure: AORTIC VALVE REPLACEMENT (AVR) USING 21 MM INSPIRIS RESILIA AORTIC VALVE;  Surgeon: Maryjane Mt, MD;  Location: [facility] OR;  Service: Open Heart Surgery;  Laterality: N/A;   BREAST BIOPSY Right 2019   BREAST LUMPECTOMY Right    BREAST SURGERY  10/2002   lumpectomy / right side   COLONOSCOPY  2016   CORONARY ARTERY BYPASS GRAFT N/A 05/06/2023   Procedure: CORONARY ARTERY BYPASS GRAFTING (CABG) X ONE USING LEFT INTERNAL MAMMARY ARTERY;  Surgeon: Maryjane Mt, MD;  Location: [facility] OR;  Service: Open Heart Surgery;  Laterality: N/A;   PACEMAKER IMPLANT N/A 01/21/2023   Procedure: PACEMAKER IMPLANT;  Surgeon: Fernande Elspeth BROCKS, MD;  Location: [facility] INVASIVE CV LAB;  Service: Cardiovascular;  Laterality: N/A;   RIGHT/LEFT HEART CATH AND CORONARY ANGIOGRAPHY N/A 02/13/2021   Procedure: RIGHT/LEFT HEART CATH AND CORONARY ANGIOGRAPHY;  Surgeon: Verlin Lonni BIRCH, MD;  Location: [facility] INVASIVE CV LAB;  Service: Cardiovascular;  Laterality: N/A;   RIGHT/LEFT HEART CATH AND CORONARY ANGIOGRAPHY N/A 04/14/2023   Procedure: RIGHT/LEFT HEART CATH AND CORONARY ANGIOGRAPHY;  Surgeon: Jordan, Peter M, MD;  Location: [facility] INVASIVE CV LAB;  Service: Cardiovascular;  Laterality: N/A;   TEE WITHOUT CARDIOVERSION N/A 05/06/2023   Procedure: TRANSESOPHAGEAL ECHOCARDIOGRAM;  Surgeon: Maryjane Mt, MD;  Location: [facility] OR;  Service: Open Heart Surgery;  Laterality: N/A;    Current Medications: Current Meds  Medication Sig   acetaminophen  (TYLENOL ) 650 MG CR tablet Take 650 mg by mouth every 8 (eight) hours as needed for pain.   aspirin  EC 81 MG tablet Take 1 tablet (81 mg total) by mouth daily. Swallow whole.   Calcium  Carbonate (CALCI-CHEW PO)  Take 1 tablet by mouth in the morning.   calcium  carbonate (TUMS EX) 750 MG chewable tablet Chew 2 tablets by mouth daily as needed for heartburn.   cholecalciferol (VITAMIN D3) 25 MCG (1000 UNIT) tablet Take 1,000 Units by mouth daily.   empagliflozin  (JARDIANCE ) 10 MG TABS tablet Take 1 tablet (10 mg total) by mouth daily before breakfast.   escitalopram  (LEXAPRO ) 10 MG tablet Take 10 mg by mouth daily.   losartan  (COZAAR ) 25 MG tablet Take 1 tablet (25 mg total) by mouth daily.   metoprolol  succinate (TOPROL  XL) 25 MG 24 hr tablet Take 1 tablet (25 mg total) by mouth daily.   Polyethyl Glycol-Propyl Glycol (LUBRICATING EYE DROPS OP) Place 1 drop into both eyes daily as needed (dry eyes).   rosuvastatin  (CRESTOR ) 10 MG tablet Take 10 mg by mouth daily.   [DISCONTINUED] losartan  (COZAAR ) 25 MG tablet Take 0.5 tablets (12.5 mg total) by  mouth daily.     Allergies:   Wound dressing adhesive   Social History   Socioeconomic History   Marital status: Divorced    Spouse name: Not on file   Number of children: 2   Years of education: Not on file   Highest education level: Not on file  Occupational History   Occupation: Retired-Customer Financial planner  Tobacco Use   Smoking status: Never   Smokeless tobacco: Never  Vaping Use   Vaping status: Never Used  Substance and Sexual Activity   Alcohol  use: Not Currently    Alcohol /week: 3.0 standard drinks of alcohol     Types: 3 Standard drinks or equivalent per week   Drug use: No   Sexual activity: Yes  Other Topics Concern   Not on file  Social History Narrative   Not on file   Social Drivers of Health   Financial Resource Strain: Not on file  Food Insecurity: Not on file  Transportation Needs: Not on file  Physical Activity: Not on file  Stress: Not on file (09/01/2019)  Social Connections: Not on file     Family History: The patient's family history includes Alzheimer's disease in her paternal aunt and paternal uncle;  Bladder Cancer in her brother; Diabetes in her brother, maternal aunt, maternal uncle, and mother; Heart Problems in her maternal uncle; Heart attack in her maternal grandfather, paternal grandfather, and paternal grandmother; Heart disease in her brother, maternal aunt, mother, and paternal aunt; Kidney failure in her brother and mother; Lung cancer (age of onset: 66) in her father; Skin cancer in her father and paternal uncle. There is no history of Breast cancer.  ROS:   Please see the history of present illness.  All other systems reviewed and are negative.  EKGs/Labs/Other Studies Reviewed:    The following studies were reviewed today:   EKG:   04/07/2023: Normal sinus rhythm, rate 77 02/13/23: A-ssened, V-paced, rate 63 11/04/2022: Normal sinus rhythm, rate 71, LVH 05/13/22: Normal sinus rhythm, rate 82, LVH, Q waves in V1-3 08/06/21: Normal sinus rhythm, rate 70, LVH, left axis deviation 07/22:  NSR, rate 69, LVH 10/24/2019: normal sinus rhythm, rate 73, Q waves in V1/2, no ST/T abnormalities  TTE 11/14/19:  1. Left ventricular ejection fraction, by estimation, is 65 to 70%. The  left ventricle has normal function. The left ventricle has no regional  wall motion abnormalities. Indeterminate diastolic filling due to E-A  fusion.   2. Right ventricular systolic function is normal. The right ventricular  size is normal. There is normal pulmonary artery systolic pressure.   3. The mitral valve is normal in structure. Mild mitral valve  regurgitation.   4. AV is thickened, calcified Peak and mean gradients through the valve  are 29 and 17 mm Hg respectively consistent with mild aortic stenosis..  The aortic valve is tricuspid. Aortic valve regurgitation is mild. Mild  aortic valve stenosis.   5. The inferior vena cava is normal in size with greater than 50%  respiratory variability, suggesting right atrial pressure of 3 mmHg.   Recent Labs: 05/07/2023: Magnesium  2.0 07/06/2023: ALT  22; Hemoglobin 11.5; Platelets 378 07/14/2023: BUN 13; Creatinine, Ser 0.92; Potassium 4.5; Sodium 141  Recent Lipid Panel    Component Value Date/Time   CHOL 60 05/08/2023 0500   TRIG 92 05/08/2023 0500   HDL 27 (L) 05/08/2023 0500   CHOLHDL 2.2 05/08/2023 0500   VLDL 18 05/08/2023 0500   LDLCALC 15 05/08/2023 0500  Physical Exam:    VS:  BP 118/62 (BP Location: Right Arm, Patient Position: Sitting, Cuff Size: Normal)   Pulse 81   Ht 5' 3 (1.6 m)   Wt 160 lb 9.6 oz (72.8 kg)   SpO2 96%   BMI 28.45 kg/m     Wt Readings from Last 3 Encounters:  07/27/23 160 lb 9.6 oz (72.8 kg)  07/06/23 158 lb (71.7 kg)  06/10/23 160 lb 11.5 oz (72.9 kg)     GEN:  Well nourished, well developed in no acute distress HEENT: Normal NECK: No JVD CARDIAC: RRR,  2/6 systolic heart murmur loudest at the RUSB RESPIRATORY:  Clear to auscultation without rales, wheezing or rhonchi  ABDOMEN: Soft, non-tender, non-distended MUSCULOSKELETAL:  No edema; No deformity  SKIN: Warm and dry NEUROLOGIC:  Alert and oriented x 3 PSYCHIATRIC:  Normal affect   ASSESSMENT:    1. Chronic combined systolic and diastolic heart failure (HCC)   2. Hyperlipidemia with target LDL less than 70   3. S/P AVR   4. S/P CABG x 1   5. Coronary artery disease involving native coronary artery of native heart without angina pectoris       PLAN:    Aortic stenosis s/p AVR: Echocardiogram on 01/16/2021 showed EF 40 to 45%, mild RV dysfunction, moderate to severe aortic stenosis (V-max 3.0 m/s, mean gradient 20 mmHg, AVA 0.8 cm, DI 0.24).  LHC/RHC on 02/13/21 showed no evidence of CAD, moderate to severe aortic stenosis (mean gradient 27 mmHg, AVA 1.1 cm).  CTA shows tricuspid aortic valve with partial fusion of RCC/LCC, possibly a forme fruste bicuspid aortic valve.  Echocardiogram on 07/16/2021 showed EF had improved to 65 to 70% and aortic stenosis was mild to moderate (V-max 2.8 m/s, mean gradient 18 mmHg, AVA 1.3  cm).  Echocardiogram 06/2022 showed an EF 60 to 65%, normal RV function, moderate to severe aortic stenosis (Vmax 3.0 m/s, MG 19 mmHg, AVA 0.8 cm^2, DI 0.27).  Echocardiogram 10/2022 showed severe aortic stenosis (mean gradient 26 mmHg, AVA 0.8 cm, DI 0.25), EF 55 to 60%, normal RV function.   LHC 04/14/2023 showed severe mid LAD stenosis.  On 05/06/2023 she underwent aortic valve replacement with 21 mm Inspiris pericardial valve and CABG x 1 with LIMA-LAD.  Echocardiogram 06/17/2023 shows EF 40 to 45%, moderate LV dilatation, normal RV function, mild to moderate mitral regurgitation, severe tricuspid regurgitation, normal functioning prosthetic aortic valve.  Chronic combined systolic and diastolic heart failure:  new diagnosis.  Echocardiogram on 01/16/2021 showed EF 40 to 45%, mild RV dysfunction, moderate to severe aortic stenosis (V-max 3.0 m/s, mean gradient 20 mmHg, AVA 0.8 cm, DI 0.24).  LHC/RHC on 02/13/21 showed no evidence of CAD, moderate to severe aortic stenosis (mean gradient 27 mmHg, AVA 1.1 cm), RA 1, RV 19/0, PA 20/8/12, PW CP 8, LVEDP 8, CI 3.2.  CTA shows tricuspid aortic valve with partial fusion of RCC/LCC, possibly a forme fruste bicuspid aortic valve.  Echocardiogram on 07/16/2021 showed EF had improved to 65 to 70% and aortic stenosis was mild to moderate (V-max 2.8 m/s, mean gradient 18 mmHg, AVA 1.3 cm).  Echocardiogram 06/17/2023 shows EF 40 to 45%, moderate LV dilatation, normal RV function, mild to moderate mitral regurgitation, severe tricuspid regurgitation, normal functioning prosthetic aortic valve. -Continue Toprol -XL 25 mg daily -Continue losartan , increase to 25 mg daily -Add Jardiance  10 mg daily -Check BMET in 1 week -Follow-up in pharmacy heart failure clinic in 2 weeks.  Would add  spironolactone  if blood pressure and labs OK  CAD: LHC 04/14/2023 showed severe mid LAD stenosis.  Status post CABG with LIMA-LAD 05/06/2023 -Continue aspirin  81 mg daily -Continue  rosuvastatin  10 mg daily  Heart block: She was found to have heart block and underwent Zio patch x 3 days which showed 2030 episodes of AV block (second-degree Mobitz 2, high-grade and third-degree). Had third-degree AV block with heart rate down to 36 bpm.  Suspect due to her severe calcific aortic stenosis.  Underwent PPM placement with Dr. Fernande on 01/21/2023.  Hyperlipidemia: LDL 148 on 11/29/21.  Started rosuvastatin  10 mg daily, LDL 15 on 05/08/2023  Hypertension: On Toprol -XL and losartan  as above  RTC in 3 months   Medication Adjustments/Labs and Tests Ordered: Current medicines are reviewed at length with the patient today.  Concerns regarding medicines are outlined above.  Orders Placed This Encounter  Procedures   Basic Metabolic Panel (BMET)   Magnesium    AMB Referral to Heartcare Pharm-D     Meds ordered this encounter  Medications   losartan  (COZAAR ) 25 MG tablet    Sig: Take 1 tablet (25 mg total) by mouth daily.    Dispense:  90 tablet    Refill:  3   empagliflozin  (JARDIANCE ) 10 MG TABS tablet    Sig: Take 1 tablet (10 mg total) by mouth daily before breakfast.    Dispense:  90 tablet    Refill:  3       Patient Instructions  Medication Instructions:  Start Jardiance  10 mg daily Start losartan  25mg  DAILY *If you need a refill on your cardiac medications before your next appointment, please call your pharmacy*   Lab Work: Bmet, Mg in one week If you have labs (blood work) drawn today and your tests are completely normal, you will receive your results only by: MyChart Message (if you have MyChart) OR A paper copy in the mail If you have any lab test that is abnormal or we need to change your treatment, we will call you to review the results.   Testing/Procedures: none   Follow-Up: At [facility], you and your health needs are our priority.  As part of our continuing mission to provide you with exceptional heart care, we have created  designated Provider Care Teams.  These Care Teams include your primary Cardiologist (physician) and Advanced Practice Providers (APPs -  Physician Assistants and Nurse Practitioners) who all work together to provide you with the care you need, when you need it.  We recommend signing up for the patient portal called MyChart.  Sign up information is provided on this After Visit Summary.  MyChart is used to connect with patients for Virtual Visits (Telemedicine).  Patients are able to view lab/test results, encounter notes, upcoming appointments, etc.  Non-urgent messages can be sent to your provider as well.   To learn more about what you can do with MyChart, go to [email or web address].    Your next appointment:   3 month(s)  Provider:   Lonni LITTIE Nanas, MD     Other Instructions Referral to Pharm D          Signed, Lonni LITTIE Nanas, MD  07/27/2023 5:29 PM    [facility]

## 2023-07-27 ENCOUNTER — Ambulatory Visit: Payer: Medicare Other | Attending: Cardiology | Admitting: Cardiology

## 2023-07-27 ENCOUNTER — Encounter (HOSPITAL_COMMUNITY)
Admission: RE | Admit: 2023-07-27 | Discharge: 2023-07-27 | Disposition: A | Discharge status: Home / Self Care | Payer: Medicare Other | Source: Ambulatory Visit | Attending: Cardiology | Admitting: Cardiology

## 2023-07-27 ENCOUNTER — Encounter: Payer: Self-pay | Admitting: Cardiology

## 2023-07-27 ENCOUNTER — Encounter (HOSPITAL_COMMUNITY): Payer: Medicare Other

## 2023-07-27 VITALS — BP 118/62 | HR 81 | Ht 63.0 in | Wt 160.6 lb

## 2023-07-27 DIAGNOSIS — Z48812 Encounter for surgical aftercare following surgery on the circulatory system: Secondary | ICD-10-CM | POA: Diagnosis not present

## 2023-07-27 DIAGNOSIS — I2510 Atherosclerotic heart disease of native coronary artery without angina pectoris: Secondary | ICD-10-CM

## 2023-07-27 DIAGNOSIS — Z952 Presence of prosthetic heart valve: Secondary | ICD-10-CM | POA: Diagnosis not present

## 2023-07-27 DIAGNOSIS — I5042 Chronic combined systolic (congestive) and diastolic (congestive) heart failure: Secondary | ICD-10-CM

## 2023-07-27 DIAGNOSIS — Z951 Presence of aortocoronary bypass graft: Secondary | ICD-10-CM

## 2023-07-27 DIAGNOSIS — E785 Hyperlipidemia, unspecified: Secondary | ICD-10-CM

## 2023-07-27 MED ORDER — EMPAGLIFLOZIN 10 MG PO TABS: 10.0000 mg | ORAL_TABLET | Freq: Every day | ORAL | 3 refills | Status: DC

## 2023-07-27 MED ORDER — LOSARTAN POTASSIUM 25 MG PO TABS: 25.0000 mg | ORAL_TABLET | Freq: Every day | ORAL | 3 refills | Status: DC

## 2023-07-27 NOTE — Patient Instructions (Signed)
 Medication Instructions:  Start Jardiance  10 mg daily Start losartan  25mg  DAILY *If you need a refill on your cardiac medications before your next appointment, please call your pharmacy*   Lab Work: Bmet, Mg in one week If you have labs (blood work) drawn today and your tests are completely normal, you will receive your results only by: MyChart Message (if you have MyChart) OR A paper copy in the mail If you have any lab test that is abnormal or we need to change your treatment, we will call you to review the results.   Testing/Procedures: none   Follow-Up: At [facility], you and your health needs are our priority.  As part of our continuing mission to provide you with exceptional heart care, we have created designated Provider Care Teams.  These Care Teams include your primary Cardiologist (physician) and Advanced Practice Providers (APPs -  Physician Assistants and Nurse Practitioners) who all work together to provide you with the care you need, when you need it.  We recommend signing up for the patient portal called MyChart.  Sign up information is provided on this After Visit Summary.  MyChart is used to connect with patients for Virtual Visits (Telemedicine).  Patients are able to view lab/test results, encounter notes, upcoming appointments, etc.  Non-urgent messages can be sent to your provider as well.   To learn more about what you can do with MyChart, go to [email or web address].    Your next appointment:   3 month(s)  Provider:   Lonni LITTIE Nanas, MD     Other Instructions Referral to Pharm D

## 2023-07-27 NOTE — Progress Notes (Signed)
 Cardiac Individual Treatment Plan  Patient Details  Name: [name] MRN: [id number] Date of Birth: 1955/11/19 Referring Provider:   Flowsheet Row INTENSIVE CARDIAC REHAB ORIENT from 06/10/2023 in [facility]  Referring Provider Lonni Nanas, MD       Initial Encounter Date:  Flowsheet Row INTENSIVE CARDIAC REHAB ORIENT from 06/10/2023 in [facility]  Date 06/10/23       Visit Diagnosis: 05/06/23 S/P CABG x 1  05/06/23 S/P aortic valve replacement  Patient's Home Medications on Admission:  Current Outpatient Medications:    acetaminophen  (TYLENOL ) 650 MG CR tablet, Take 650 mg by mouth every 8 (eight) hours as needed for pain., Disp: , Rfl:    aspirin  EC 81 MG tablet, Take 1 tablet (81 mg total) by mouth daily. Swallow whole., Disp: 150 tablet, Rfl: 2   Calcium  Carbonate (CALCI-CHEW PO), Take 1 tablet by mouth in the morning., Disp: , Rfl:    calcium  carbonate (TUMS EX) 750 MG chewable tablet, Chew 2 tablets by mouth daily as needed for heartburn., Disp: , Rfl:    cholecalciferol (VITAMIN D3) 25 MCG (1000 UNIT) tablet, Take 1,000 Units by mouth daily., Disp: , Rfl:    escitalopram  (LEXAPRO ) 10 MG tablet, Take 10 mg by mouth daily., Disp: , Rfl:    ferrous sulfate  325 (65 FE) MG EC tablet, Take 1 tablet (325 mg total) by mouth daily with breakfast. For one month then stop. If develops constipation, may take laxative or stop iron., Disp: , Rfl:    losartan  (COZAAR ) 25 MG tablet, Take 0.5 tablets (12.5 mg total) by mouth daily., Disp: 90 tablet, Rfl: 3   metoprolol  succinate (TOPROL  XL) 25 MG 24 hr tablet, Take 1 tablet (25 mg total) by mouth daily., Disp: 90 tablet, Rfl: 3   Polyethyl Glycol-Propyl Glycol (LUBRICATING EYE DROPS OP), Place 1 drop into both eyes daily as needed (dry eyes)., Disp: , Rfl:    rosuvastatin  (CRESTOR ) 10 MG tablet, Take 10 mg  by mouth daily., Disp: , Rfl:    traMADol  (ULTRAM ) 50 MG tablet, Take 1 tablet (50 mg total) by mouth every 6 (six) hours as needed for moderate pain (pain score 4-6)., Disp: 28 tablet, Rfl: 0  Past Medical History: Past Medical History:  Diagnosis Date   Anxiety    Aortic valve stenosis    Arthritis    Breast cancer (HCC) 2004   right breast; ER/PR+, Her2+   Cancer (HCC) 10/2002   right breast lumpectomy   Coronary artery disease    Dysrhythmia    2nd Degree AV block   GERD (gastroesophageal reflux disease)    Heart murmur    History of kidney stones    HX   HTN (hypertension)    Hyperlipidemia    Morton neuroma    Personal history of chemotherapy 11/19/2002   Personal history of radiation therapy 02/19/2003   Pneumonia    Pre-diabetes    Presence of permanent cardiac pacemaker 01/21/2023   [employer]tronic    Tobacco Use: Social History   Tobacco Use  Smoking Status Never  Smokeless Tobacco Never    Labs: Review Flowsheet  More data may exist      Latest Ref Rng & Units 02/13/2021 04/14/2023 05/04/2023 05/06/2023 05/08/2023  Labs for ITP Cardiac and Pulmonary Rehab  Cholestrol 0 - 200 mg/dL - - - - 60   LDL (calc) 0 - 99  mg/dL - - - - 15   HDL-C >59 mg/dL - - - - 27   Trlycerides <150 mg/dL - - - - 92   Hemoglobin A1c 4.8 - 5.6 % - - 5.7  - -  PH, Arterial 7.35 - 7.45 7.379  7.323  7.43  7.365  7.285  7.275  7.337  7.375  -  PCO2 arterial 32 - 48 mmHg 41.7  45.9  36  44.7  41.3  46.4  38.1  34.8  -  Bicarbonate 20.0 - 28.0 mmol/L 25.8  24.6  24.3  24.1  23.8  23.9  25.5  19.7  21.8  20.4  21.9  20.4  -  TCO2 22 - 32 mmol/L 27  26  26  25  25   - 27  21  23  20  22  22  23  21  22   -  Acid-base deficit 0.0 - 2.0 mmol/L 1.0  2.0  2.0  2.0  0.1  7.0  5.0  5.0  3.0  4.0  -  O2 Saturation % 76.0  99.0  69  72  98  95.7  98  99  98  92  74  100  -    Details       Multiple values from one day are sorted in reverse-chronological order         Capillary Blood  Glucose: Lab Results  Component Value Date   GLUCAP 116 (H) 05/11/2023   GLUCAP 123 (H) 05/10/2023   GLUCAP 87 05/10/2023   GLUCAP 92 05/10/2023   GLUCAP 125 (H) 05/09/2023     Exercise Target Goals: Exercise Program Goal: Individual exercise prescription set using results from initial 6 min walk test and THRR while considering  patient's activity barriers and safety.   Exercise Prescription Goal: Initial exercise prescription builds to 30-45 minutes a day of aerobic activity, 2-3 days per week.  Home exercise guidelines will be given to patient during program as part of exercise prescription that the participant will acknowledge.  Activity Barriers & Risk Stratification:  Activity Barriers & Cardiac Risk Stratification - 06/10/23 1108       Activity Barriers & Cardiac Risk Stratification   Activity Barriers Arthritis;Back Problems;Incisional Pain    Cardiac Risk Stratification High   <5 METs on            6 Minute Walk:  6 Minute Walk     Row Name 06/10/23 1208         6 Minute Walk   Phase Initial     Distance 1310 feet     Walk Time 6 minutes     # of Rest Breaks 0     MPH 2.48     METS 2.93     RPE 10     Perceived Dyspnea  0     VO2 Peak 10.26     Symptoms No     Resting HR 80 bpm     Resting BP 108/66     Resting Oxygen Saturation  94 %     Exercise Oxygen Saturation  during 6 min walk 95 %     Max Ex. HR 101 bpm     Max Ex. BP 122/68     2 Minute Post BP 112/70              Oxygen Initial Assessment:   Oxygen Re-Evaluation:   Oxygen Discharge (Final Oxygen Re-Evaluation):   Initial Exercise Prescription:  Initial Exercise Prescription - 06/10/23 1200       Date of Initial Exercise RX and Referring Provider   Date 06/10/23    Referring Provider Lonni Nanas, MD    Expected Discharge Date 09/02/23      Treadmill   MPH 2    Grade 0    Minutes 15    METs 2.5      Recumbant Bike   Level 1    RPM 60    Watts 18     Minutes 15    METs 2.5      Prescription Details   Frequency (times per week) 3    Duration Progress to 30 minutes of continuous aerobic without signs/symptoms of physical distress      Intensity   THRR 40-80% of Max Heartrate 61-122    Ratings of Perceived Exertion 11-13    Perceived Dyspnea 0-4      Progression   Progression Continue progressive overload as per policy without signs/symptoms or physical distress.      Resistance Training   Training Prescription Yes    Weight 2    Reps 10-15             Perform Capillary Blood Glucose checks as needed.  Exercise Prescription Changes:   Exercise Prescription Changes     Row Name 06/15/23 1017 06/22/23 1030 07/13/23 1032 07/27/23 1024       Response to Exercise   Blood Pressure (Admit) 142/68 122/64 132/68 122/70    Blood Pressure (Exercise) 122/58 122/72 132/64 --    Blood Pressure (Exit) 120/68 108/70 124/80 110/62    Heart Rate (Admit) 79 bpm 79 bpm 79 bpm 85 bpm    Heart Rate (Exercise) 115 bpm 111 bpm 119 bpm 123 bpm    Heart Rate (Exit) 89 bpm 88 bpm 88 bpm 96 bpm    Rating of Perceived Exertion (Exercise) 11 11 12 12     Symptoms None None None None    Comments Off to a good start with exercise. Reviewed METs with Adrien. -- Reviewed goals with Toy.    Duration Continue with 30 min of aerobic exercise without signs/symptoms of physical distress. Continue with 30 min of aerobic exercise without signs/symptoms of physical distress. Continue with 30 min of aerobic exercise without signs/symptoms of physical distress. Continue with 30 min of aerobic exercise without signs/symptoms of physical distress.    Intensity THRR unchanged THRR unchanged THRR unchanged THRR unchanged      Progression   Progression Continue to progress workloads to maintain intensity without signs/symptoms of physical distress. Continue to progress workloads to maintain intensity without signs/symptoms of physical distress. Continue to  progress workloads to maintain intensity without signs/symptoms of physical distress. Continue to progress workloads to maintain intensity without signs/symptoms of physical distress.    Average METs 2.4 2.4 2.8 3      Resistance Training   Training Prescription Yes Yes Yes Yes    Weight 2 2 3  lbs 3 lbs    Reps 10-15 10-15 10-15 10-15    Time 10 Minutes 10 Minutes 10 Minutes 10 Minutes      Interval Training   Interval Training No No No No      Treadmill   MPH 2 2 2.5 2.8    Grade 0 0 0 0    Minutes 15 15 15 15     METs 2.53 2.53 2.91 3.14      Recumbant Bike   Level 1 2 3  3  RPM 74 66 54 56    Watts 18 21 28 30     Minutes 15 15 15 15     METs 2.3 2.3 2.8 2.9      Home Exercise Plan   Plans to continue exercise at -- -- Home (comment)  Walking, stationary bike [employer] (comment)  Water aerobics at gym.    Frequency -- -- Add 2 additional days to program exercise sessions. Add 2 additional days to program exercise sessions.    Initial Home Exercises Provided -- -- 06/29/23 06/29/23             Exercise Comments:   Exercise Comments     Row Name 06/15/23 1127 06/22/23 1122 06/29/23 1052 07/27/23 1043     Exercise Comments [name] tolerated low intensity exercise well without symptoms. Oriented to the stretching and exercise routine. Reviewed METs with Adrien. Reviewed home exercise guidelines and goals with [name]. Reviewed goals/ exercise action plan with [name].             Exercise Goals and Review:   Exercise Goals     Row Name 06/10/23 1109             Exercise Goals   Increase Physical Activity Yes       Intervention Provide advice, education, support and counseling about physical activity/exercise needs.;Develop an individualized exercise prescription for aerobic and resistive training based on initial evaluation findings, risk stratification, comorbidities and participant's personal goals.       Expected Outcomes Short Term: Attend rehab on a  regular basis to increase amount of physical activity.;Long Term: Add in home exercise to make exercise part of routine and to increase amount of physical activity.;Long Term: Exercising regularly at least 3-5 days a week.       Increase Strength and Stamina Yes       Intervention Provide advice, education, support and counseling about physical activity/exercise needs.;Develop an individualized exercise prescription for aerobic and resistive training based on initial evaluation findings, risk stratification, comorbidities and participant's personal goals.       Expected Outcomes Short Term: Perform resistance training exercises routinely during rehab and add in resistance training at home;Short Term: Increase workloads from initial exercise prescription for resistance, speed, and METs.;Long Term: Improve cardiorespiratory fitness, muscular endurance and strength as measured by increased METs and functional capacity ( )       Able to understand and use rate of perceived exertion (RPE) scale Yes       Intervention Provide education and explanation on how to use RPE scale       Expected Outcomes Short Term: Able to use RPE daily in rehab to express subjective intensity level;Long Term:  Able to use RPE to guide intensity level when exercising independently       Knowledge and understanding of Target Heart Rate Range (THRR) Yes       Intervention Provide education and explanation of THRR including how the numbers were predicted and where they are located for reference       Expected Outcomes Short Term: Able to state/look up THRR;Short Term: Able to use daily as guideline for intensity in rehab;Long Term: Able to use THRR to govern intensity when exercising independently       Understanding of Exercise Prescription Yes       Intervention Provide education, explanation, and written materials on patient's individual exercise prescription       Expected Outcomes Short Term: Able to explain program exercise  prescription;Long Term: Able  to explain home exercise prescription to exercise independently                Exercise Goals Re-Evaluation :  Exercise Goals Re-Evaluation     Row Name 06/15/23 1127 06/29/23 1052 07/27/23 1043         Exercise Goal Re-Evaluation   Exercise Goals Review Increase Physical Activity;Increase Strength and Stamina;Able to understand and use rate of perceived exertion (RPE) scale Increase Physical Activity;Increase Strength and Stamina;Able to understand and use rate of perceived exertion (RPE) scale;Understanding of Exercise Prescription;Knowledge and understanding of Target Heart Rate Range (THRR) Increase Physical Activity;Increase Strength and Stamina;Able to understand and use rate of perceived exertion (RPE) scale;Understanding of Exercise Prescription;Knowledge and understanding of Target Heart Rate Range (THRR)     Comments [name] was able to understand and use RPE scale appropriately. Reviewed exercise prescription with [name]. She is walking 30 minutes 2-3 days/week and plans to start riding her stationary bike. [name] states she's been cleared by her cardiologist to resumse water aerobics. She restart water aerobics at the gym tomorrow and plans to participate in water aerobics 60 minutes on Tuesdays and Thursdays. She also has a stationary bike at home that she plans to use for her home exercise. Her goal was to be able to return to water aerobics.     Expected Outcomes Progress workloads as tolerated to help increase cardiorespiratory fitness. [name] will walk or ride her stationary bike 30 minutes 2-3 days/week to achieve 150 minutes of aerobic exercise/week. [name] will participate in water aerobics 60 minutes, 2 days/week and ride her stationary bike as her mode of home exercise.              Discharge Exercise Prescription (Final Exercise Prescription Changes):  Exercise Prescription Changes - 07/27/23 1024       Response to Exercise   Blood Pressure  (Admit) 122/70    Blood Pressure (Exit) 110/62    Heart Rate (Admit) 85 bpm    Heart Rate (Exercise) 123 bpm    Heart Rate (Exit) 96 bpm    Rating of Perceived Exertion (Exercise) 12    Symptoms None    Comments Reviewed goals with Adrien.    Duration Continue with 30 min of aerobic exercise without signs/symptoms of physical distress.    Intensity THRR unchanged      Progression   Progression Continue to progress workloads to maintain intensity without signs/symptoms of physical distress.    Average METs 3      Resistance Training   Training Prescription Yes    Weight 3 lbs    Reps 10-15    Time 10 Minutes      Interval Training   Interval Training No      Treadmill   MPH 2.8    Grade 0    Minutes 15    METs 3.14      Recumbant Bike   Level 3    RPM 56    Watts 30    Minutes 15    METs 2.9      Home Exercise Plan   Plans to continue exercise at [employer] (comment)   Water aerobics at gym.   Frequency Add 2 additional days to program exercise sessions.    Initial Home Exercises Provided 06/29/23             Nutrition:  Target Goals: Understanding of nutrition guidelines, daily intake of sodium 1500mg , cholesterol 200mg , calories 30% from fat and 7% or less  from saturated fats, daily to have 5 or more servings of fruits and vegetables.  Biometrics:  Pre Biometrics - 06/10/23 1107       Pre Biometrics   Waist Circumference 34.5 inches    Hip Circumference 38.5 inches    Waist to Hip Ratio 0.9 %    Triceps Skinfold 35 mm    % Body Fat 40.2 %    Grip Strength 24 kg    Flexibility 14 in    Single Leg Stand 30 seconds              Nutrition Therapy Plan and Nutrition Goals:  Nutrition Therapy & Goals - 06/15/23 1151       Nutrition Therapy   Diet Heart Healthy Diet    Drug/Food Interactions Statins/Certain Fruits      Personal Nutrition Goals   Nutrition Goal Patient to identify strategies for reducing cardiovascular risk by  attending the [employer] education and nutrition series weekly.    Personal Goal #2 Patient to improve diet quality by using the plate method as a guide for meal planning to include lean protein/plant protein, fruits, vegetables, whole grains, nonfat dairy as part of a well-balanced diet.    Comments [name] has medical history of s/p AVR, CABG x1, breast cancer. Her lipids are well controlled at goal. Patient will benefit from participation in intensive cardiac rehab for nutrition, exercise, and lifestyle modification.      Intervention Plan   Intervention Prescribe, educate and counsel regarding individualized specific dietary modifications aiming towards targeted core components such as weight, hypertension, lipid management, diabetes, heart failure and other comorbidities.;Nutrition handout(s) given to patient.    Expected Outcomes Short Term Goal: Understand basic principles of dietary content, such as calories, fat, sodium, cholesterol and nutrients.;Long Term Goal: Adherence to prescribed nutrition plan.             Nutrition Assessments:  Nutrition Assessments - 06/15/23 1454       Rate Your Plate Scores   Pre Score 68            MEDIFICTS Score Key: >=70 Need to make dietary changes  40-70 Heart Healthy Diet <= 40 Therapeutic Level Cholesterol Diet   Flowsheet Row INTENSIVE CARDIAC REHAB from 06/15/2023 in [facility]  Picture Your Plate Total Score on Admission 68      Picture Your Plate Scores: <59 Unhealthy dietary pattern with much room for improvement. 41-50 Dietary pattern unlikely to meet recommendations for good health and room for improvement. 51-60 More healthful dietary pattern, with some room for improvement.  >60 Healthy dietary pattern, although there may be some specific behaviors that could be improved.    Nutrition Goals Re-Evaluation:  Nutrition Goals Re-Evaluation     Row Name 06/15/23  1151             Goals   Current Weight 159 lb 13.3 oz (72.5 kg)       Comment LDL 15, HDL 27, A1c 5.7       Expected Outcome [name] has medical history of s/p AVR, CABG x1, breast cancer. Her lipids are well controlled at goal. Patient will benefit from participation in intensive cardiac rehab for nutrition, exercise, and lifestyle modification.                Nutrition Goals Re-Evaluation:  Nutrition Goals Re-Evaluation     Row Name 06/15/23 1151  Goals   Current Weight 159 lb 13.3 oz (72.5 kg)       Comment LDL 15, HDL 27, A1c 5.7       Expected Outcome [name] has medical history of s/p AVR, CABG x1, breast cancer. Her lipids are well controlled at goal. Patient will benefit from participation in intensive cardiac rehab for nutrition, exercise, and lifestyle modification.                Nutrition Goals Discharge (Final Nutrition Goals Re-Evaluation):  Nutrition Goals Re-Evaluation - 06/15/23 1151       Goals   Current Weight 159 lb 13.3 oz (72.5 kg)    Comment LDL 15, HDL 27, A1c 5.7    Expected Outcome [name] has medical history of s/p AVR, CABG x1, breast cancer. Her lipids are well controlled at goal. Patient will benefit from participation in intensive cardiac rehab for nutrition, exercise, and lifestyle modification.             Psychosocial: Target Goals: Acknowledge presence or absence of significant depression and/or stress, maximize coping skills, provide positive support system. Participant is able to verbalize types and ability to use techniques and skills needed for reducing stress and depression.  Initial Review & Psychosocial Screening:  Initial Psych Review & Screening - 06/10/23 1110       Initial Review   Current issues with Current Psychotropic Meds      Family Dynamics   Good Support System? Yes   [name] has her son and daughter for support   Comments [name] takes Lexapro  and feels it is working for her. She denies any  current feelings of anxiety/depression/stress.      Barriers   Psychosocial barriers to participate in program There are no identifiable barriers or psychosocial needs.      Screening Interventions   Interventions Encouraged to exercise;Provide feedback about the scores to participant    Expected Outcomes Short Term goal: Identification and review with participant of any Quality of Life or Depression concerns found by scoring the questionnaire.;Long Term goal: The participant improves quality of Life and PHQ9 Scores as seen by post scores and/or verbalization of changes             Quality of Life Scores:  Quality of Life - 06/10/23 1124       Quality of Life   Select Quality of Life      Quality of Life Scores   Health/Function Pre 25 %    Socioeconomic Pre 30 %    Psych/Spiritual Pre 27.5 %    Family Pre 27 %    GLOBAL Pre 26.79 %            Scores of 19 and below usually indicate a poorer quality of life in these areas.  A difference of  2-3 points is a clinically meaningful difference.  A difference of 2-3 points in the total score of the Quality of Life Index has been associated with significant improvement in overall quality of life, self-image, physical symptoms, and general health in studies assessing change in quality of life.  PHQ-9: Review Flowsheet       06/10/2023 01/30/2014  Depression screen PHQ 2/9  Decreased Interest 0 -  Down, Depressed, Hopeless 0 0  PHQ - 2 Score 0 0  Altered sleeping 2 -  Tired, decreased energy 0 -  Change in appetite 0 -  Feeling bad or failure about yourself  0 -  Trouble concentrating 0 -  Moving  slowly or fidgety/restless 0 -  Suicidal thoughts 0 -  PHQ-9 Score 2 -  Difficult doing work/chores Not difficult at all -   Interpretation of Total Score  Total Score Depression Severity:  1-4 = Minimal depression, 5-9 = Mild depression, 10-14 = Moderate depression, 15-19 = Moderately severe depression, 20-27 = Severe  depression   Psychosocial Evaluation and Intervention:   Psychosocial Re-Evaluation:  Psychosocial Re-Evaluation     Row Name 06/15/23 1154 06/30/23 1258 07/23/23 1434         Psychosocial Re-Evaluation   Current issues with Current Psychotropic Meds Current Psychotropic Meds;Current Stress Concerns Current Psychotropic Meds;Current Stress Concerns     Comments [name] did not voice any increased concerns or stressors on her first day of exercise. [name] voiced concerns about her recent echo report. [name] will discuss with Dr Kate on 07/06/23. [name] is switching to the 0645 class in January 2025. [name] has not voiced any increased concerns or stressors during exercise at cardiac rehab. [name] says she has decided to stay in the 1015 class     Interventions Encouraged to attend Cardiac Rehabilitation for the exercise;Stress management education;Relaxation education Encouraged to attend Cardiac Rehabilitation for the exercise;Stress management education;Relaxation education Encouraged to attend Cardiac Rehabilitation for the exercise;Stress management education;Relaxation education     Continue Psychosocial Services  No Follow up required Follow up required by staff Follow up required by staff       Initial Review   Source of Stress Concerns -- Chronic Illness Chronic Illness     Comments -- Will continue to monitor and offer support as needed. Will continue to monitor and offer support as needed.              Psychosocial Discharge (Final Psychosocial Re-Evaluation):  Psychosocial Re-Evaluation - 07/23/23 1434       Psychosocial Re-Evaluation   Current issues with Current Psychotropic Meds;Current Stress Concerns    Comments [name] has not voiced any increased concerns or stressors during exercise at cardiac rehab. [name] says she has decided to stay in the 1015 class    Interventions Encouraged to attend Cardiac Rehabilitation for the exercise;Stress management education;Relaxation  education    Continue Psychosocial Services  Follow up required by staff      Initial Review   Source of Stress Concerns Chronic Illness    Comments Will continue to monitor and offer support as needed.             Vocational Rehabilitation: Provide vocational rehab assistance to qualifying candidates.   Vocational Rehab Evaluation & Intervention:  Vocational Rehab - 06/10/23 1115       Initial Vocational Rehab Evaluation & Intervention   Assessment shows need for Vocational Rehabilitation No   [name] is retired            Education: Education Goals: Education classes will be provided on a weekly basis, covering required topics. Participant will state understanding/return demonstration of topics presented.    Education     Row Name 06/15/23 1200     Education   Cardiac Education Topics [employer]   Nurse, Children's Exercise Physiologist   Select Psychosocial   Psychosocial Healthy Minds, Bodies, Hearts   Instruction Review Code 1- Verbalizes Understanding   Class Start Time 1142   Class Stop Time 1218   Class Time Calculation (min) 36 min    Row Name 06/24/23 1100     Education   Cardiac Education  Topics [employer]   Customer Service Manager   Weekly Topic Tasty Appetizers and Snacks   Instruction Review Code 1- Verbalizes Understanding   Class Start Time 1145   Class Stop Time 1223   Class Time Calculation (min) 38 min    Row Name 06/26/23 1500     Education   Cardiac Education Topics Pritikin   Glass Blower/designer Nutrition   Nutrition Workshop Label Reading   Instruction Review Code 1- Verbalizes Understanding   Class Start Time 1144   Class Stop Time 1219   Class Time Calculation (min) 35 min    Row Name 06/29/23 1600     Education   Cardiac Education Topics Pritikin   Acupuncturist     Workshops   Educator Exercise  Physiologist   Select Psychosocial   Psychosocial Workshop Recognizing and Reducing Stress   Instruction Review Code 1- Verbalizes Understanding   Class Start Time 1142   Class Stop Time 1231   Class Time Calculation (min) 49 min    Row Name 07/01/23 1400     Education   Cardiac Education Topics Pritikin   Customer Service Manager   Weekly Topic Efficiency Cooking - Meals in a Snap   Instruction Review Code 1- Verbalizes Understanding   Class Start Time 1143   Class Stop Time 1226   Class Time Calculation (min) 43 min    Row Name 07/03/23 1500     Education   Cardiac Education Topics Pritikin   Nurse, Children's   Educator Dietitian   Select Nutrition   Nutrition Calorie Density   Instruction Review Code 1- Verbalizes Understanding   Class Start Time 1149   Class Stop Time 1227   Class Time Calculation (min) 38 min    Row Name 07/13/23 1200     Education   Cardiac Education Topics Pritikin   Psychologist, Forensic General Education   General Education Hypertension and Heart Disease   Instruction Review Code 1- Verbalizes Understanding   Class Start Time 1147   Class Stop Time 1221   Class Time Calculation (min) 34 min    Row Name 07/17/23 1400     Education   Cardiac Education Topics Pritikin   Glass Blower/designer Nutrition   Nutrition Workshop Targeting Your Nutrition Priorities   Instruction Review Code 1- Verbalizes Understanding   Class Start Time 1145   Class Stop Time 1228   Class Time Calculation (min) 43 min    Row Name 07/24/23 1300     Education   Cardiac Education Topics Pritikin   Licensed Conveyancer Nutrition   Nutrition Dining Out - Part 1   Instruction Review Code 1- Verbalizes Understanding   Class Start Time 1146   Class Stop Time  1234   Class Time Calculation (min) 48 min    Row Name 07/27/23 1100     Education   Cardiac Education Topics Pritikin   Psychologist, Forensic Exercise Education   Exercise  Education Biomechanial Limitations   Instruction Review Code 1- Verbalizes Understanding   Class Start Time 1144   Class Stop Time 1217   Class Time Calculation (min) 33 min            Core Videos: Exercise    Move It!  Clinical staff conducted group or individual video education with verbal and written material and guidebook.  Patient learns the recommended Pritikin exercise program. Exercise with the goal of living a long, healthy life. Some of the health benefits of exercise include controlled diabetes, healthier blood pressure levels, improved cholesterol levels, improved heart and lung capacity, improved sleep, and better body composition. Everyone should speak with their doctor before starting or changing an exercise routine.  Biomechanical Limitations Clinical staff conducted group or individual video education with verbal and written material and guidebook.  Patient learns how biomechanical limitations can impact exercise and how we can mitigate and possibly overcome limitations to have an impactful and balanced exercise routine.  Body Composition Clinical staff conducted group or individual video education with verbal and written material and guidebook.  Patient learns that body composition (ratio of muscle mass to fat mass) is a key component to assessing overall fitness, rather than body weight alone. Increased fat mass, especially visceral belly fat, can put us  at increased risk for metabolic syndrome, type 2 diabetes, heart disease, and even death. It is recommended to combine diet and exercise (cardiovascular and resistance training) to improve your body composition. Seek guidance from your physician and exercise physiologist before  implementing an exercise routine.  Exercise Action Plan Clinical staff conducted group or individual video education with verbal and written material and guidebook.  Patient learns the recommended strategies to achieve and enjoy long-term exercise adherence, including variety, self-motivation, self-efficacy, and positive decision making. Benefits of exercise include fitness, good health, weight management, more energy, better sleep, less stress, and overall well-being.  Medical   Heart Disease Risk Reduction Clinical staff conducted group or individual video education with verbal and written material and guidebook.  Patient learns our heart is our most vital organ as it circulates oxygen, nutrients, white blood cells, and hormones throughout the entire body, and carries waste away. Data supports a plant-based eating plan like the Pritikin Program for its effectiveness in slowing progression of and reversing heart disease. The video provides a number of recommendations to address heart disease.   Metabolic Syndrome and Belly Fat  Clinical staff conducted group or individual video education with verbal and written material and guidebook.  Patient learns what metabolic syndrome is, how it leads to heart disease, and how one can reverse it and keep it from coming back. You have metabolic syndrome if you have 3 of the following 5 criteria: abdominal obesity, high blood pressure, high triglycerides, low HDL cholesterol, and high blood sugar.  Hypertension and Heart Disease Clinical staff conducted group or individual video education with verbal and written material and guidebook.  Patient learns that high blood pressure, or hypertension, is very common in the [place] . Hypertension is largely due to excessive salt intake, but other important risk factors include being overweight, physical inactivity, drinking too much alcohol , smoking, and not eating enough potassium from fruits and vegetables. High  blood pressure is a leading risk factor for heart attack, stroke, congestive heart failure, dementia, kidney failure, and premature death. Long-term effects of excessive salt intake include stiffening of the arteries and thickening of heart muscle and organ damage. Recommendations include ways to reduce hypertension and  the risk of heart disease.  Diseases of Our Time - Focusing on Diabetes Clinical staff conducted group or individual video education with verbal and written material and guidebook.  Patient learns why the best way to stop diseases of our time is prevention, through food and other lifestyle changes. Medicine (such as prescription pills and surgeries) is often only a Band-Aid on the problem, not a long-term solution. Most common diseases of our time include obesity, type 2 diabetes, hypertension, heart disease, and cancer. The Pritikin Program is recommended and has been proven to help reduce, reverse, and/or prevent the damaging effects of metabolic syndrome.  Nutrition   Overview of the Pritikin Eating Plan  Clinical staff conducted group or individual video education with verbal and written material and guidebook.  Patient learns about the Pritikin Eating Plan for disease risk reduction. The Pritikin Eating Plan emphasizes a wide variety of unrefined, minimally-processed carbohydrates, like fruits, vegetables, whole grains, and legumes. Go, Caution, and Stop food choices are explained. Plant-based and lean animal proteins are emphasized. Rationale provided for low sodium intake for blood pressure control, low added sugars for blood sugar stabilization, and low added fats and oils for coronary artery disease risk reduction and weight management.  Calorie Density  Clinical staff conducted group or individual video education with verbal and written material and guidebook.  Patient learns about calorie density and how it impacts the Pritikin Eating Plan. Knowing the characteristics of the  food you choose will help you decide whether those foods will lead to weight gain or weight loss, and whether you want to consume more or less of them. Weight loss is usually a side effect of the Pritikin Eating Plan because of its focus on low calorie-dense foods.  Label Reading  Clinical staff conducted group or individual video education with verbal and written material and guidebook.  Patient learns about the Pritikin recommended label reading guidelines and corresponding recommendations regarding calorie density, added sugars, sodium content, and whole grains.  Dining Out - Part 1  Clinical staff conducted group or individual video education with verbal and written material and guidebook.  Patient learns that restaurant meals can be sabotaging because they can be so high in calories, fat, sodium, and/or sugar. Patient learns recommended strategies on how to positively address this and avoid unhealthy pitfalls.  Facts on Fats  Clinical staff conducted group or individual video education with verbal and written material and guidebook.  Patient learns that lifestyle modifications can be just as effective, if not more so, as many medications for lowering your risk of heart disease. A Pritikin lifestyle can help to reduce your risk of inflammation and atherosclerosis (cholesterol build-up, or plaque, in the artery walls). Lifestyle interventions such as dietary choices and physical activity address the cause of atherosclerosis. A review of the types of fats and their impact on blood cholesterol levels, along with dietary recommendations to reduce fat intake is also included.  Nutrition Action Plan  Clinical staff conducted group or individual video education with verbal and written material and guidebook.  Patient learns how to incorporate Pritikin recommendations into their lifestyle. Recommendations include planning and keeping personal health goals in mind as an important part of their  success.  Healthy Mind-Set    Healthy Minds, Bodies, Hearts  Clinical staff conducted group or individual video education with verbal and written material and guidebook.  Patient learns how to identify when they are stressed. Video will discuss the impact of that stress, as well as the many  benefits of stress management. Patient will also be introduced to stress management techniques. The way we think, act, and feel has an impact on our hearts.  How Our Thoughts Can Heal Our Hearts  Clinical staff conducted group or individual video education with verbal and written material and guidebook.  Patient learns that negative thoughts can cause depression and anxiety. This can result in negative lifestyle behavior and serious health problems. Cognitive behavioral therapy is an effective method to help control our thoughts in order to change and improve our emotional outlook.  Additional Videos:  Exercise    Improving Performance  Clinical staff conducted group or individual video education with verbal and written material and guidebook.  Patient learns to use a non-linear approach by alternating intensity levels and lengths of time spent exercising to help burn more calories and lose more body fat. Cardiovascular exercise helps improve heart health, metabolism, hormonal balance, blood sugar control, and recovery from fatigue. Resistance training improves strength, endurance, balance, coordination, reaction time, metabolism, and muscle mass. Flexibility exercise improves circulation, posture, and balance. Seek guidance from your physician and exercise physiologist before implementing an exercise routine and learn your capabilities and proper form for all exercise.  Introduction to Yoga  Clinical staff conducted group or individual video education with verbal and written material and guidebook.  Patient learns about yoga, a discipline of the coming together of mind, breath, and body. The benefits of yoga  include improved flexibility, improved range of motion, better posture and core strength, increased lung function, weight loss, and positive self-image. Yoga's heart health benefits include lowered blood pressure, healthier heart rate, decreased cholesterol and triglyceride levels, improved immune function, and reduced stress. Seek guidance from your physician and exercise physiologist before implementing an exercise routine and learn your capabilities and proper form for all exercise.  Medical   Aging: Enhancing Your Quality of Life  Clinical staff conducted group or individual video education with verbal and written material and guidebook.  Patient learns key strategies and recommendations to stay in good physical health and enhance quality of life, such as prevention strategies, having an advocate, securing a Health Care Proxy and Power of Attorney, and keeping a list of medications and system for tracking them. It also discusses how to avoid risk for bone loss.  Biology of Weight Control  Clinical staff conducted group or individual video education with verbal and written material and guidebook.  Patient learns that weight gain occurs because we consume more calories than we burn (eating more, moving less). Even if your body weight is normal, you may have higher ratios of fat compared to muscle mass. Too much body fat puts you at increased risk for cardiovascular disease, heart attack, stroke, type 2 diabetes, and obesity-related cancers. In addition to exercise, following the Pritikin Eating Plan can help reduce your risk.  Decoding Lab Results  Clinical staff conducted group or individual video education with verbal and written material and guidebook.  Patient learns that lab test reflects one measurement whose values change over time and are influenced by many factors, including medication, stress, sleep, exercise, food, hydration, pre-existing medical conditions, and more. It is recommended to  use the knowledge from this video to become more involved with your lab results and evaluate your numbers to speak with your doctor.   Diseases of Our Time - Overview  Clinical staff conducted group or individual video education with verbal and written material and guidebook.  Patient learns that according to the CDC, 50% to  70% of chronic diseases (such as obesity, type 2 diabetes, elevated lipids, hypertension, and heart disease) are avoidable through lifestyle improvements including healthier food choices, listening to satiety cues, and increased physical activity.  Sleep Disorders Clinical staff conducted group or individual video education with verbal and written material and guidebook.  Patient learns how good quality and duration of sleep are important to overall health and well-being. Patient also learns about sleep disorders and how they impact health along with recommendations to address them, including discussing with a physician.  Nutrition  Dining Out - Part 2 Clinical staff conducted group or individual video education with verbal and written material and guidebook.  Patient learns how to plan ahead and communicate in order to maximize their dining experience in a healthy and nutritious manner. Included are recommended food choices based on the type of restaurant the patient is visiting.   Fueling a Banker conducted group or individual video education with verbal and written material and guidebook.  There is a strong connection between our food choices and our health. Diseases like obesity and type 2 diabetes are very prevalent and are in large-part due to lifestyle choices. The Pritikin Eating Plan provides plenty of food and hunger-curbing satisfaction. It is easy to follow, affordable, and helps reduce health risks.  Menu Workshop  Clinical staff conducted group or individual video education with verbal and written material and guidebook.  Patient learns  that restaurant meals can sabotage health goals because they are often packed with calories, fat, sodium, and sugar. Recommendations include strategies to plan ahead and to communicate with the manager, chef, or server to help order a healthier meal.  Planning Your Eating Strategy  Clinical staff conducted group or individual video education with verbal and written material and guidebook.  Patient learns about the Pritikin Eating Plan and its benefit of reducing the risk of disease. The Pritikin Eating Plan does not focus on calories. Instead, it emphasizes high-quality, nutrient-rich foods. By knowing the characteristics of the foods, we choose, we can determine their calorie density and make informed decisions.  Targeting Your Nutrition Priorities  Clinical staff conducted group or individual video education with verbal and written material and guidebook.  Patient learns that lifestyle habits have a tremendous impact on disease risk and progression. This video provides eating and physical activity recommendations based on your personal health goals, such as reducing LDL cholesterol, losing weight, preventing or controlling type 2 diabetes, and reducing high blood pressure.  Vitamins and Minerals  Clinical staff conducted group or individual video education with verbal and written material and guidebook.  Patient learns different ways to obtain key vitamins and minerals, including through a recommended healthy diet. It is important to discuss all supplements you take with your doctor.   Healthy Mind-Set    Smoking Cessation  Clinical staff conducted group or individual video education with verbal and written material and guidebook.  Patient learns that cigarette smoking and tobacco addiction pose a serious health risk which affects millions of people. Stopping smoking will significantly reduce the risk of heart disease, lung disease, and many forms of cancer. Recommended strategies for quitting  are covered, including working with your doctor to develop a successful plan.  Culinary   Becoming a Set Designer conducted group or individual video education with verbal and written material and guidebook.  Patient learns that cooking at home can be healthy, cost-effective, quick, and puts them in control. Keys to cooking healthy recipes  will include looking at your recipe, assessing your equipment needs, planning ahead, making it simple, choosing cost-effective seasonal ingredients, and limiting the use of added fats, salts, and sugars.  Cooking - Breakfast and Snacks  Clinical staff conducted group or individual video education with verbal and written material and guidebook.  Patient learns how important breakfast is to satiety and nutrition through the entire day. Recommendations include key foods to eat during breakfast to help stabilize blood sugar levels and to prevent overeating at meals later in the day. Planning ahead is also a key component.  Cooking - Educational Psychologist conducted group or individual video education with verbal and written material and guidebook.  Patient learns eating strategies to improve overall health, including an approach to cook more at home. Recommendations include thinking of animal protein as a side on your plate rather than center stage and focusing instead on lower calorie dense options like vegetables, fruits, whole grains, and plant-based proteins, such as beans. Making sauces in large quantities to freeze for later and leaving the skin on your vegetables are also recommended to maximize your experience.  Cooking - Healthy Salads and Dressing Clinical staff conducted group or individual video education with verbal and written material and guidebook.  Patient learns that vegetables, fruits, whole grains, and legumes are the foundations of the Pritikin Eating Plan. Recommendations include how to incorporate each of these in  flavorful and healthy salads, and how to create homemade salad dressings. Proper handling of ingredients is also covered. Cooking - Soups and [employer] - Soups and Desserts Clinical staff conducted group or individual video education with verbal and written material and guidebook.  Patient learns that Pritikin soups and desserts make for easy, nutritious, and delicious snacks and meal components that are low in sodium, fat, sugar, and calorie density, while high in vitamins, minerals, and filling fiber. Recommendations include simple and healthy ideas for soups and desserts.   Overview     The Pritikin Solution Program Overview Clinical staff conducted group or individual video education with verbal and written material and guidebook.  Patient learns that the results of the Pritikin Program have been documented in more than 100 articles published in peer-reviewed journals, and the benefits include reducing risk factors for (and, in some cases, even reversing) high cholesterol, high blood pressure, type 2 diabetes, obesity, and more! An overview of the three key pillars of the Pritikin Program will be covered: eating well, doing regular exercise, and having a healthy mind-set.  WORKSHOPS  Exercise: Exercise Basics: Building Your Action Plan Clinical staff led group instruction and group discussion with PowerPoint presentation and patient guidebook. To enhance the learning environment the use of posters, models and videos may be added. At the conclusion of this workshop, patients will comprehend the difference between physical activity and exercise, as well as the benefits of incorporating both, into their routine. Patients will understand the FITT (Frequency, Intensity, Time, and Type) principle and how to use it to build an exercise action plan. In addition, safety concerns and other considerations for exercise and cardiac rehab will be addressed by the presenter. The purpose of this  lesson is to promote a comprehensive and effective weekly exercise routine in order to improve patients' overall level of fitness.   Managing Heart Disease: Your Path to a Healthier Heart Clinical staff led group instruction and group discussion with PowerPoint presentation and patient guidebook. To enhance the learning environment the use of posters, models and videos  may be added.At the conclusion of this workshop, patients will understand the anatomy and physiology of the heart. Additionally, they will understand how Pritikin's three pillars impact the risk factors, the progression, and the management of heart disease.  The purpose of this lesson is to provide a high-level overview of the heart, heart disease, and how the Pritikin lifestyle positively impacts risk factors.  Exercise Biomechanics Clinical staff led group instruction and group discussion with PowerPoint presentation and patient guidebook. To enhance the learning environment the use of posters, models and videos may be added. Patients will learn how the structural parts of their bodies function and how these functions impact their daily activities, movement, and exercise. Patients will learn how to promote a neutral spine, learn how to manage pain, and identify ways to improve their physical movement in order to promote healthy living. The purpose of this lesson is to expose patients to common physical limitations that impact physical activity. Participants will learn practical ways to adapt and manage aches and pains, and to minimize their effect on regular exercise. Patients will learn how to maintain good posture while sitting, walking, and lifting.  Balance Training and Fall Prevention  Clinical staff led group instruction and group discussion with PowerPoint presentation and patient guidebook. To enhance the learning environment the use of posters, models and videos may be added. At the conclusion of this workshop,  patients will understand the importance of their sensorimotor skills (vision, proprioception, and the vestibular system) in maintaining their ability to balance as they age. Patients will apply a variety of balancing exercises that are appropriate for their current level of function. Patients will understand the common causes for poor balance, possible solutions to these problems, and ways to modify their physical environment in order to minimize their fall risk. The purpose of this lesson is to teach patients about the importance of maintaining balance as they age and ways to minimize their risk of falling.  WORKSHOPS   Nutrition:  Fueling a Ship Broker led group instruction and group discussion with PowerPoint presentation and patient guidebook. To enhance the learning environment the use of posters, models and videos may be added. Patients will review the foundational principles of the Pritikin Eating Plan and understand what constitutes a serving size in each of the food groups. Patients will also learn Pritikin-friendly foods that are better choices when away from home and review make-ahead meal and snack options. Calorie density will be reviewed and applied to three nutrition priorities: weight maintenance, weight loss, and weight gain. The purpose of this lesson is to reinforce (in a group setting) the key concepts around what patients are recommended to eat and how to apply these guidelines when away from home by planning and selecting Pritikin-friendly options. Patients will understand how calorie density may be adjusted for different weight management goals.  Mindful Eating  Clinical staff led group instruction and group discussion with PowerPoint presentation and patient guidebook. To enhance the learning environment the use of posters, models and videos may be added. Patients will briefly review the concepts of the Pritikin Eating Plan and the importance of low-calorie dense  foods. The concept of mindful eating will be introduced as well as the importance of paying attention to internal hunger signals. Triggers for non-hunger eating and techniques for dealing with triggers will be explored. The purpose of this lesson is to provide patients with the opportunity to review the basic principles of the Pritikin Eating Plan, discuss the value of eating  mindfully and how to measure internal cues of hunger and fullness using the Hunger Scale. Patients will also discuss reasons for non-hunger eating and learn strategies to use for controlling emotional eating.  Targeting Your Nutrition Priorities Clinical staff led group instruction and group discussion with PowerPoint presentation and patient guidebook. To enhance the learning environment the use of posters, models and videos may be added. Patients will learn how to determine their genetic susceptibility to disease by reviewing their family history. Patients will gain insight into the importance of diet as part of an overall healthy lifestyle in mitigating the impact of genetics and other environmental insults. The purpose of this lesson is to provide patients with the opportunity to assess their personal nutrition priorities by looking at their family history, their own health history and current risk factors. Patients will also be able to discuss ways of prioritizing and modifying the Pritikin Eating Plan for their highest risk areas  Menu  Clinical staff led group instruction and group discussion with PowerPoint presentation and patient guidebook. To enhance the learning environment the use of posters, models and videos may be added. Using menus brought in from [employer], or printed from [employer], patients will apply the Pritikin dining out guidelines that were presented in the [employer] video. Patients will also be able to practice these guidelines in a variety of provided scenarios. The purpose of  this lesson is to provide patients with the opportunity to practice hands-on learning of the Pritikin Dining Out guidelines with actual menus and practice scenarios.  Label Reading Clinical staff led group instruction and group discussion with PowerPoint presentation and patient guidebook. To enhance the learning environment the use of posters, models and videos may be added. Patients will review and discuss the Pritikin label reading guidelines presented in [employer]ikin's Label Reading Educational series video. Using fool labels brought in from local grocery stores and markets, patients will apply the label reading guidelines and determine if the packaged food meet the Pritikin guidelines. The purpose of this lesson is to provide patients with the opportunity to review, discuss, and practice hands-on learning of the Pritikin Label Reading guidelines with actual packaged food labels. Cooking School  Pritikin's [employer] are designed to teach patients ways to prepare quick, simple, and affordable recipes at home. The importance of nutrition's role in chronic disease risk reduction is reflected in its emphasis in the overall Pritikin program. By learning how to prepare essential core Pritikin Eating Plan recipes, patients will increase control over what they eat; be able to customize the flavor of foods without the use of added salt, sugar, or fat; and improve the quality of the food they consume. By learning a set of core recipes which are easily assembled, quickly prepared, and affordable, patients are more likely to prepare more healthy foods at home. These workshops focus on convenient breakfasts, simple entres, side dishes, and desserts which can be prepared with minimal effort and are consistent with nutrition recommendations for cardiovascular risk reduction. Cooking [employer] are taught by a armed forces logistics/support/administrative officer (RD) who has been trained by the [employer]. The  chef or RD has a clear understanding of the importance of minimizing - if not completely eliminating - added fat, sugar, and sodium in recipes. Throughout the series of Cooking School Workshop sessions, patients will learn about healthy ingredients and efficient methods of cooking to build confidence in their capability to prepare    [employer] weekly topics:  Adding Flavor- Sodium-Free  Fast and Healthy Breakfasts  Powerhouse Plant-Based Proteins  Satisfying Salads and Dressings  Simple Sides and Sauces  International Cuisine-Spotlight on the Blue Zones  Delicious Desserts  Savory Soups  Efficiency Cooking - Meals in a Snap  Tasty Appetizers and Snacks  Comforting Weekend Breakfasts  One-Pot Wonders   Fast Evening Meals  Landscape Architect Your Pritikin Plate  WORKSHOPS   Healthy Mindset (Psychosocial):  Focused Goals, Sustainable Changes Clinical staff led group instruction and group discussion with PowerPoint presentation and patient guidebook. To enhance the learning environment the use of posters, models and videos may be added. Patients will be able to apply effective goal setting strategies to establish at least one personal goal, and then take consistent, meaningful action toward that goal. They will learn to identify common barriers to achieving personal goals and develop strategies to overcome them. Patients will also gain an understanding of how our mind-set can impact our ability to achieve goals and the importance of cultivating a positive and growth-oriented mind-set. The purpose of this lesson is to provide patients with a deeper understanding of how to set and achieve personal goals, as well as the tools and strategies needed to overcome common obstacles which may arise along the way.  From Head to Heart: The Power of a Healthy Outlook  Clinical staff led group instruction and group discussion with PowerPoint presentation and patient guidebook. To  enhance the learning environment the use of posters, models and videos may be added. Patients will be able to recognize and describe the impact of emotions and mood on physical health. They will discover the importance of self-care and explore self-care practices which may work for them. Patients will also learn how to utilize the 4 C's to cultivate a healthier outlook and better manage stress and challenges. The purpose of this lesson is to demonstrate to patients how a healthy outlook is an essential part of maintaining good health, especially as they continue their cardiac rehab journey.  Healthy Sleep for a Healthy Heart Clinical staff led group instruction and group discussion with PowerPoint presentation and patient guidebook. To enhance the learning environment the use of posters, models and videos may be added. At the conclusion of this workshop, patients will be able to demonstrate knowledge of the importance of sleep to overall health, well-being, and quality of life. They will understand the symptoms of, and treatments for, common sleep disorders. Patients will also be able to identify daytime and nighttime behaviors which impact sleep, and they will be able to apply these tools to help manage sleep-related challenges. The purpose of this lesson is to provide patients with a general overview of sleep and outline the importance of quality sleep. Patients will learn about a few of the most common sleep disorders. Patients will also be introduced to the concept of "sleep hygiene," and discover ways to self-manage certain sleeping problems through simple daily behavior changes. Finally, the workshop will motivate patients by clarifying the links between quality sleep and their goals of heart-healthy living.   Recognizing and Reducing Stress Clinical staff led group instruction and group discussion with PowerPoint presentation and patient guidebook. To enhance the learning environment the use of posters,  models and videos may be added. At the conclusion of this workshop, patients will be able to understand the types of stress reactions, differentiate between acute and chronic stress, and recognize the impact that chronic stress has on their health. They will also be able to apply  different coping mechanisms, such as reframing negative self-talk. Patients will have the opportunity to practice a variety of stress management techniques, such as deep abdominal breathing, progressive muscle relaxation, and/or guided imagery.  The purpose of this lesson is to educate patients on the role of stress in their lives and to provide healthy techniques for coping with it.  Learning Barriers/Preferences:  Learning Barriers/Preferences - 06/10/23 1115       Learning Barriers/Preferences   Learning Barriers Sight   contacts/glasses   Learning Preferences Audio;Computer/Internet;Group Instruction;Individual Instruction;Pictoral;Skilled Demonstration;Verbal Instruction;Video;Written Material             Education Topics:  Knowledge Questionnaire Score:  Knowledge Questionnaire Score - 06/10/23 1115       Knowledge Questionnaire Score   Pre Score 22/24             Core Components/Risk Factors/Patient Goals at Admission:  Personal Goals and Risk Factors at Admission - 06/10/23 1115       Core Components/Risk Factors/Patient Goals on Admission    Weight Management Yes;Weight Loss    Intervention Weight Management: Develop a combined nutrition and exercise program designed to reach desired caloric intake, while maintaining appropriate intake of nutrient and fiber, sodium and fats, and appropriate energy expenditure required for the weight goal.;Weight Management: Provide education and appropriate resources to help participant work on and attain dietary goals.    Expected Outcomes Long Term: Adherence to nutrition and physical activity/exercise program aimed toward attainment of established weight  goal;Short Term: Continue to assess and modify interventions until short term weight is achieved;Weight Loss: Understanding of general recommendations for a balanced deficit meal plan, which promotes 1-2 lb weight loss per week and includes a negative energy balance of [phone] kcal/d;Understanding recommendations for meals to include 15-35% energy as protein, 25-35% energy from fat, 35-60% energy from carbohydrates, less than 200mg  of dietary cholesterol, 20-35 gm of total fiber daily;Understanding of distribution of calorie intake throughout the day with the consumption of 4-5 meals/snacks    Hypertension Yes    Intervention Provide education on lifestyle modifcations including regular physical activity/exercise, weight management, moderate sodium restriction and increased consumption of fresh fruit, vegetables, and low fat dairy, alcohol  moderation, and smoking cessation.;Monitor prescription use compliance.    Expected Outcomes Short Term: Continued assessment and intervention until BP is < 140/57mm HG in hypertensive participants. < 130/35mm HG in hypertensive participants with diabetes, heart failure or chronic kidney disease.;Long Term: Maintenance of blood pressure at goal levels.    Lipids Yes    Intervention Provide education and support for participant on nutrition & aerobic/resistive exercise along with prescribed medications to achieve LDL 70mg , HDL >40mg .    Expected Outcomes Short Term: Participant states understanding of desired cholesterol values and is compliant with medications prescribed. Participant is following exercise prescription and nutrition guidelines.;Long Term: Cholesterol controlled with medications as prescribed, with individualized exercise RX and with personalized nutrition plan. Value goals: LDL < 70mg , HDL > 40 mg.    Stress Yes    Intervention Offer individual and/or small group education and counseling on adjustment to heart disease, stress management and health-related  lifestyle change. Teach and support self-help strategies.;Refer participants experiencing significant psychosocial distress to appropriate mental health specialists for further evaluation and treatment. When possible, include family members and significant others in education/counseling sessions.    Expected Outcomes Short Term: Participant demonstrates changes in health-related behavior, relaxation and other stress management skills, ability to obtain effective social support, and compliance with psychotropic medications if prescribed.;Long  Term: Emotional wellbeing is indicated by absence of clinically significant psychosocial distress or social isolation.             Core Components/Risk Factors/Patient Goals Review:   Goals and Risk Factor Review     Row Name 06/15/23 1156 06/30/23 1302 07/23/23 1437         Core Components/Risk Factors/Patient Goals Review   Personal Goals Review Weight Management/Obesity;Lipids;Hypertension;Stress Weight Management/Obesity;Lipids;Hypertension;Stress Weight Management/Obesity;Lipids;Hypertension;Stress     Review [name] started cardiac rehab on 06/15/23. [name] did well with exercise. Vital signs were stable. [name] is off to a good start to exercise. Vital signs have been  stable. [name] has gained 2.3 kg since starting cardiac rehab. [name]  continues to do well with  exercise at cardiac rehab . Vital signs have been stable.     Expected Outcomes Lexys will continue to participate in cardiac rehab for exercise, nutrtion and lifestyle modifications Taquita will continue to participate in cardiac rehab for exercise, nutrtion and lifestyle modifications Jamacia will continue to participate in cardiac rehab for exercise, nutrtion and lifestyle modifications              Core Components/Risk Factors/Patient Goals at Discharge (Final Review):   Goals and Risk Factor Review - 07/23/23 1437       Core Components/Risk Factors/Patient Goals Review   Personal  Goals Review Weight Management/Obesity;Lipids;Hypertension;Stress    Review Tilia  continues to do well with  exercise at cardiac rehab . Vital signs have been stable.    Expected Outcomes Joletta will continue to participate in cardiac rehab for exercise, nutrtion and lifestyle modifications             ITP Comments:  ITP Comments     Row Name 06/10/23 1106 06/15/23 1154 06/30/23 1255 07/23/23 1432     ITP Comments Dr. Wilbert Bihari medical director. Introduction to pritikin education/intensive cardiac rehab. Initial orientation packet reviewed with patient. 30 Day ITP Review. Tammy started cardiac rehab on 06/15/23. Anarie did well with exercise. 30 Day ITP Review. Kacie is off to a good start to exercise at cardiac rehab.. 30 Day ITP Review. Jeff has good attendance and participation with exercise at  cardiac rehab..             Comments: See ITP Comments

## 2023-07-29 ENCOUNTER — Encounter (HOSPITAL_COMMUNITY): Payer: Medicare Other

## 2023-07-29 ENCOUNTER — Encounter (HOSPITAL_COMMUNITY)
Admission: RE | Admit: 2023-07-29 | Discharge: 2023-07-29 | Disposition: A | Discharge status: Home / Self Care | Payer: Medicare Other | Source: Ambulatory Visit | Attending: Cardiology | Admitting: Cardiology

## 2023-07-29 DIAGNOSIS — Z951 Presence of aortocoronary bypass graft: Secondary | ICD-10-CM

## 2023-07-29 DIAGNOSIS — Z48812 Encounter for surgical aftercare following surgery on the circulatory system: Secondary | ICD-10-CM | POA: Diagnosis not present

## 2023-07-29 DIAGNOSIS — Z952 Presence of prosthetic heart valve: Secondary | ICD-10-CM | POA: Diagnosis not present

## 2023-07-30 ENCOUNTER — Encounter: Payer: Self-pay | Admitting: Cardiology

## 2023-07-30 NOTE — Telephone Encounter (Signed)
 Called spoke to patient. Patient states she has not had to pay before . She thinks it is because,it is the first of the yeaar. Rn  inquired if patient has  Medicare supplement or  nurse, learning disability.  Patient states she has Medicare.  RN  informed patient there has been changes in the medicare plans this year in regards to medications brand and generic.  RN informed patient there is no generic for Jardiance   RN advice patient to contact medicare to to explain  possible deductible or cost increase.  Patient states she will , but she will contact pharmacy  and obtain 30 day supply instead of 90 day. She states incase Dr Kate changes  or medication does not work out.  RN that was appropriate , pharmacy should be abel to accommodate the request. If she has any issue she can contact the office. Patient verbalized understanding.

## 2023-07-31 ENCOUNTER — Encounter (HOSPITAL_COMMUNITY): Payer: Medicare Other

## 2023-07-31 ENCOUNTER — Encounter (HOSPITAL_COMMUNITY)
Admission: RE | Admit: 2023-07-31 | Discharge: 2023-07-31 | Disposition: A | Discharge status: Home / Self Care | Payer: Medicare Other | Source: Ambulatory Visit | Attending: Cardiology | Admitting: Cardiology

## 2023-07-31 DIAGNOSIS — Z48812 Encounter for surgical aftercare following surgery on the circulatory system: Secondary | ICD-10-CM | POA: Diagnosis not present

## 2023-07-31 DIAGNOSIS — Z952 Presence of prosthetic heart valve: Secondary | ICD-10-CM | POA: Diagnosis not present

## 2023-07-31 DIAGNOSIS — Z951 Presence of aortocoronary bypass graft: Secondary | ICD-10-CM

## 2023-08-03 ENCOUNTER — Encounter (HOSPITAL_COMMUNITY): Payer: Medicare Other

## 2023-08-03 ENCOUNTER — Encounter (HOSPITAL_COMMUNITY)
Admission: RE | Admit: 2023-08-03 | Discharge: 2023-08-03 | Disposition: A | Discharge status: Home / Self Care | Payer: Medicare Other | Source: Ambulatory Visit | Attending: Cardiology | Admitting: Cardiology

## 2023-08-03 DIAGNOSIS — Z952 Presence of prosthetic heart valve: Secondary | ICD-10-CM

## 2023-08-03 DIAGNOSIS — Z951 Presence of aortocoronary bypass graft: Secondary | ICD-10-CM | POA: Diagnosis not present

## 2023-08-03 DIAGNOSIS — Z48812 Encounter for surgical aftercare following surgery on the circulatory system: Secondary | ICD-10-CM | POA: Diagnosis not present

## 2023-08-05 ENCOUNTER — Encounter (HOSPITAL_COMMUNITY)
Admission: RE | Admit: 2023-08-05 | Discharge: 2023-08-05 | Disposition: A | Discharge status: Home / Self Care | Payer: Medicare Other | Source: Ambulatory Visit | Attending: Cardiology | Admitting: Cardiology

## 2023-08-05 ENCOUNTER — Other Ambulatory Visit: Payer: Self-pay

## 2023-08-05 ENCOUNTER — Encounter (HOSPITAL_COMMUNITY): Payer: Medicare Other

## 2023-08-05 DIAGNOSIS — E785 Hyperlipidemia, unspecified: Secondary | ICD-10-CM

## 2023-08-05 DIAGNOSIS — Z48812 Encounter for surgical aftercare following surgery on the circulatory system: Secondary | ICD-10-CM | POA: Diagnosis not present

## 2023-08-05 DIAGNOSIS — I5042 Chronic combined systolic (congestive) and diastolic (congestive) heart failure: Secondary | ICD-10-CM

## 2023-08-05 DIAGNOSIS — Z951 Presence of aortocoronary bypass graft: Secondary | ICD-10-CM | POA: Diagnosis not present

## 2023-08-05 DIAGNOSIS — Z952 Presence of prosthetic heart valve: Secondary | ICD-10-CM

## 2023-08-05 LAB — BASIC METABOLIC PANEL
BUN/Creatinine Ratio: 17 (ref 12–28)
BUN: 18 mg/dL (ref 8–27)
CO2: 20 mmol/L (ref 20–29)
Calcium: 9.6 mg/dL (ref 8.7–10.3)
Chloride: 103 mmol/L (ref 96–106)
Creatinine, Ser: 1.06 mg/dL — ABNORMAL HIGH (ref 0.57–1.00)
Glucose: 107 mg/dL — ABNORMAL HIGH (ref 70–99)
Potassium: 4.7 mmol/L (ref 3.5–5.2)
Sodium: 136 mmol/L (ref 134–144)
eGFR: 58 mL/min/{1.73_m2} — ABNORMAL LOW (ref >59)

## 2023-08-05 LAB — MAGNESIUM: Magnesium: 2 mg/dL (ref 1.6–2.3)

## 2023-08-06 ENCOUNTER — Other Ambulatory Visit: Payer: Self-pay | Admitting: Physician Assistant

## 2023-08-07 ENCOUNTER — Encounter (HOSPITAL_COMMUNITY)
Admission: RE | Admit: 2023-08-07 | Discharge: 2023-08-07 | Disposition: A | Discharge status: Home / Self Care | Payer: Medicare Other | Source: Ambulatory Visit | Attending: Cardiology | Admitting: Cardiology

## 2023-08-07 ENCOUNTER — Encounter (HOSPITAL_COMMUNITY): Payer: Medicare Other

## 2023-08-07 DIAGNOSIS — Z951 Presence of aortocoronary bypass graft: Secondary | ICD-10-CM

## 2023-08-07 DIAGNOSIS — Z48812 Encounter for surgical aftercare following surgery on the circulatory system: Secondary | ICD-10-CM | POA: Diagnosis not present

## 2023-08-07 DIAGNOSIS — Z952 Presence of prosthetic heart valve: Secondary | ICD-10-CM

## 2023-08-07 DIAGNOSIS — E7800 Pure hypercholesterolemia, unspecified: Secondary | ICD-10-CM | POA: Diagnosis not present

## 2023-08-07 DIAGNOSIS — R7303 Prediabetes: Secondary | ICD-10-CM | POA: Diagnosis not present

## 2023-08-07 DIAGNOSIS — I10 Essential (primary) hypertension: Secondary | ICD-10-CM | POA: Diagnosis not present

## 2023-08-07 DIAGNOSIS — I2510 Atherosclerotic heart disease of native coronary artery without angina pectoris: Secondary | ICD-10-CM | POA: Diagnosis not present

## 2023-08-10 ENCOUNTER — Encounter (HOSPITAL_COMMUNITY): Payer: Medicare Other

## 2023-08-10 ENCOUNTER — Encounter (HOSPITAL_COMMUNITY)
Admission: RE | Admit: 2023-08-10 | Discharge: 2023-08-10 | Disposition: A | Discharge status: Home / Self Care | Payer: Medicare Other | Source: Ambulatory Visit | Attending: Cardiology | Admitting: Cardiology

## 2023-08-10 DIAGNOSIS — Z48812 Encounter for surgical aftercare following surgery on the circulatory system: Secondary | ICD-10-CM | POA: Diagnosis not present

## 2023-08-10 DIAGNOSIS — Z951 Presence of aortocoronary bypass graft: Secondary | ICD-10-CM | POA: Diagnosis not present

## 2023-08-10 DIAGNOSIS — Z952 Presence of prosthetic heart valve: Secondary | ICD-10-CM

## 2023-08-12 ENCOUNTER — Encounter (HOSPITAL_COMMUNITY): Payer: Medicare Other

## 2023-08-12 ENCOUNTER — Encounter (HOSPITAL_COMMUNITY)
Admission: RE | Admit: 2023-08-12 | Discharge: 2023-08-12 | Disposition: A | Discharge status: Home / Self Care | Payer: Medicare Other | Source: Ambulatory Visit | Attending: Cardiology | Admitting: Cardiology

## 2023-08-12 ENCOUNTER — Encounter: Payer: Self-pay | Admitting: *Deleted

## 2023-08-12 DIAGNOSIS — Z48812 Encounter for surgical aftercare following surgery on the circulatory system: Secondary | ICD-10-CM | POA: Diagnosis not present

## 2023-08-12 DIAGNOSIS — Z952 Presence of prosthetic heart valve: Secondary | ICD-10-CM

## 2023-08-12 DIAGNOSIS — Z951 Presence of aortocoronary bypass graft: Secondary | ICD-10-CM | POA: Diagnosis not present

## 2023-08-14 ENCOUNTER — Encounter (HOSPITAL_COMMUNITY)
Admission: RE | Admit: 2023-08-14 | Discharge: 2023-08-14 | Disposition: A | Discharge status: Home / Self Care | Payer: Medicare Other | Source: Ambulatory Visit | Attending: Cardiology | Admitting: Cardiology

## 2023-08-14 ENCOUNTER — Encounter (HOSPITAL_COMMUNITY): Payer: Medicare Other

## 2023-08-14 DIAGNOSIS — Z952 Presence of prosthetic heart valve: Secondary | ICD-10-CM

## 2023-08-14 DIAGNOSIS — Z951 Presence of aortocoronary bypass graft: Secondary | ICD-10-CM

## 2023-08-14 DIAGNOSIS — Z48812 Encounter for surgical aftercare following surgery on the circulatory system: Secondary | ICD-10-CM | POA: Diagnosis not present

## 2023-08-17 ENCOUNTER — Encounter (HOSPITAL_COMMUNITY): Payer: Medicare Other

## 2023-08-17 ENCOUNTER — Encounter (HOSPITAL_COMMUNITY)
Admission: RE | Admit: 2023-08-17 | Discharge: 2023-08-17 | Disposition: A | Discharge status: Home / Self Care | Payer: Medicare Other | Source: Ambulatory Visit | Attending: Cardiology | Admitting: Cardiology

## 2023-08-17 DIAGNOSIS — Z951 Presence of aortocoronary bypass graft: Secondary | ICD-10-CM

## 2023-08-17 DIAGNOSIS — Z952 Presence of prosthetic heart valve: Secondary | ICD-10-CM | POA: Diagnosis not present

## 2023-08-17 DIAGNOSIS — Z48812 Encounter for surgical aftercare following surgery on the circulatory system: Secondary | ICD-10-CM | POA: Diagnosis not present

## 2023-08-18 DIAGNOSIS — K080 Exfoliation of teeth due to systemic causes: Secondary | ICD-10-CM | POA: Diagnosis not present

## 2023-08-19 ENCOUNTER — Encounter: Payer: Self-pay | Admitting: Internal Medicine

## 2023-08-19 ENCOUNTER — Encounter (HOSPITAL_COMMUNITY): Payer: Medicare Other

## 2023-08-19 ENCOUNTER — Encounter (HOSPITAL_COMMUNITY)
Admission: RE | Admit: 2023-08-19 | Discharge: 2023-08-19 | Disposition: A | Discharge status: Home / Self Care | Payer: Medicare Other | Source: Ambulatory Visit | Attending: Cardiology | Admitting: Cardiology

## 2023-08-19 DIAGNOSIS — Z951 Presence of aortocoronary bypass graft: Secondary | ICD-10-CM | POA: Diagnosis not present

## 2023-08-19 DIAGNOSIS — Z952 Presence of prosthetic heart valve: Secondary | ICD-10-CM

## 2023-08-19 DIAGNOSIS — Z48812 Encounter for surgical aftercare following surgery on the circulatory system: Secondary | ICD-10-CM | POA: Diagnosis not present

## 2023-08-21 ENCOUNTER — Encounter (HOSPITAL_COMMUNITY)
Admission: RE | Admit: 2023-08-21 | Discharge: 2023-08-21 | Disposition: A | Discharge status: Home / Self Care | Payer: Medicare Other | Source: Ambulatory Visit | Attending: Cardiology | Admitting: Cardiology

## 2023-08-21 ENCOUNTER — Encounter (HOSPITAL_COMMUNITY): Payer: Medicare Other

## 2023-08-21 DIAGNOSIS — Z951 Presence of aortocoronary bypass graft: Secondary | ICD-10-CM | POA: Diagnosis not present

## 2023-08-21 DIAGNOSIS — Z952 Presence of prosthetic heart valve: Secondary | ICD-10-CM

## 2023-08-21 DIAGNOSIS — Z48812 Encounter for surgical aftercare following surgery on the circulatory system: Secondary | ICD-10-CM | POA: Diagnosis not present

## 2023-08-24 ENCOUNTER — Telehealth (HOSPITAL_COMMUNITY): Payer: Self-pay | Admitting: *Deleted

## 2023-08-24 ENCOUNTER — Encounter (HOSPITAL_COMMUNITY)
Admission: RE | Admit: 2023-08-24 | Discharge: 2023-08-24 | Disposition: A | Discharge status: Home / Self Care | Payer: Medicare Other | Source: Ambulatory Visit | Attending: Cardiology | Admitting: Cardiology

## 2023-08-24 ENCOUNTER — Encounter (HOSPITAL_COMMUNITY): Payer: Medicare Other

## 2023-08-24 DIAGNOSIS — Z952 Presence of prosthetic heart valve: Secondary | ICD-10-CM | POA: Insufficient documentation

## 2023-08-24 DIAGNOSIS — Z951 Presence of aortocoronary bypass graft: Secondary | ICD-10-CM | POA: Diagnosis not present

## 2023-08-24 NOTE — Progress Notes (Signed)
Cardiac Individual Treatment Plan  Patient Details  Name: [name] MRN: [id number] Date of Birth: 10-16-1955 Referring Provider:   Flowsheet Row INTENSIVE CARDIAC REHAB ORIENT from 06/10/2023 in [facility]  Referring Provider Epifanio Lesches, MD       Initial Encounter Date:  Flowsheet Row INTENSIVE CARDIAC REHAB ORIENT from 06/10/2023 in [facility]  Date 06/10/23       Visit Diagnosis: 05/06/23 S/P CABG x 1  05/06/23 S/P aortic valve replacement  Patient's Home Medications on Admission:  Current Outpatient Medications:    acetaminophen (TYLENOL) 650 MG CR tablet, Take 650 mg by mouth every 8 (eight) hours as needed for pain., Disp: , Rfl:    aspirin EC 81 MG tablet, Take 1 tablet (81 mg total) by mouth daily. Swallow whole., Disp: 150 tablet, Rfl: 2   Calcium Carbonate (CALCI-CHEW PO), Take 1 tablet by mouth in the morning., Disp: , Rfl:    calcium carbonate (TUMS EX) 750 MG chewable tablet, Chew 2 tablets by mouth daily as needed for heartburn., Disp: , Rfl:    cholecalciferol (VITAMIN D3) 25 MCG (1000 UNIT) tablet, Take 1,000 Units by mouth daily., Disp: , Rfl:    empagliflozin (JARDIANCE) 10 MG TABS tablet, Take 1 tablet (10 mg total) by mouth daily before breakfast., Disp: 90 tablet, Rfl: 3   escitalopram (LEXAPRO) 10 MG tablet, Take 10 mg by mouth daily., Disp: , Rfl:    ferrous sulfate 325 (65 FE) MG EC tablet, Take 1 tablet (325 mg total) by mouth daily with breakfast. For one month then stop. If develops constipation, may take laxative or stop iron., Disp: , Rfl:    losartan (COZAAR) 25 MG tablet, Take 1 tablet (25 mg total) by mouth daily., Disp: 90 tablet, Rfl: 3   metoprolol succinate (TOPROL XL) 25 MG 24 hr tablet, Take 1 tablet (25 mg total) by mouth daily., Disp: 90 tablet, Rfl: 3   Polyethyl Glycol-Propyl Glycol (LUBRICATING EYE  DROPS OP), Place 1 drop into both eyes daily as needed (dry eyes)., Disp: , Rfl:    rosuvastatin (CRESTOR) 10 MG tablet, Take 10 mg by mouth daily., Disp: , Rfl:    traMADol (ULTRAM) 50 MG tablet, Take 1 tablet (50 mg total) by mouth every 6 (six) hours as needed for moderate pain (pain score 4-6). (Patient not taking: Reported on 07/27/2023), Disp: 28 tablet, Rfl: 0  Past Medical History: Past Medical History:  Diagnosis Date   Anxiety    Aortic valve stenosis    Arthritis    Breast cancer (HCC) 2004   right breast; ER/PR+, Her2+   Cancer (HCC) 10/2002   right breast lumpectomy   Coronary artery disease    Dysrhythmia    2nd Degree AV block   GERD (gastroesophageal reflux disease)    Heart murmur    History of Cox stones    HX   HTN (hypertension)    Hyperlipidemia    Morton neuroma    Personal history of chemotherapy 11/19/2002   Personal history of radiation therapy 02/19/2003   Pneumonia    Pre-diabetes    Presence of permanent cardiac pacemaker 01/21/2023   [employer]    Tobacco Use: Social History   Tobacco Use  Smoking Status Never  Smokeless Tobacco Never    Labs: Review Flowsheet  More data may exist      Latest Ref Rng & Units 02/13/2021  04/14/2023 05/04/2023 05/06/2023 05/08/2023  Labs for ITP Cardiac and Pulmonary Rehab  Cholestrol 0 - 200 mg/dL - - - - 60   LDL (calc) 0 - 99 mg/dL - - - - 15   HDL-C >44 mg/dL - - - - 27   Trlycerides <150 mg/dL - - - - 92   Hemoglobin A1c 4.8 - 5.6 % - - 5.7  - -  PH, Arterial 7.35 - 7.45 7.379  7.323  7.43  7.365  7.285  7.275  7.337  7.375  -  PCO2 arterial 32 - 48 mmHg 41.7  45.9  36  44.7  41.3  46.4  38.1  34.8  -  Bicarbonate 20.0 - 28.0 mmol/L 25.8  24.6  24.3  24.1  23.8  23.9  25.5  19.7  21.8  20.4  21.9  20.4  -  TCO2 22 - 32 mmol/L 27  26  26  25  25   - 27  21  23  20  22  22  23  21  22   -  Acid-base deficit 0.0 - 2.0 mmol/L 1.0  2.0  2.0  2.0  0.1  7.0  5.0  5.0  3.0  4.0  -  O2 Saturation % 76.0  99.0   69  72  98  95.7  98  99  98  92  74  100  -    Details       Multiple values from one day are sorted in reverse-chronological order         Capillary Blood Glucose: Lab Results  Component Value Date   GLUCAP 116 (H) 05/11/2023   GLUCAP 123 (H) 05/10/2023   GLUCAP 87 05/10/2023   GLUCAP 92 05/10/2023   GLUCAP 125 (H) 05/09/2023     Exercise Target Goals: Exercise Program Goal: Individual exercise prescription set using results from initial 6 min walk test and THRR while considering  patient's activity barriers and safety.   Exercise Prescription Goal: Initial exercise prescription builds to 30-45 minutes a day of aerobic activity, 2-3 days per week.  Home exercise guidelines will be given to patient during program as part of exercise prescription that the participant will acknowledge.  Activity Barriers & Risk Stratification:  Activity Barriers & Cardiac Risk Stratification - 06/10/23 1108       Activity Barriers & Cardiac Risk Stratification   Activity Barriers Arthritis;Back Problems;Incisional Pain    Cardiac Risk Stratification High   <5 METs on            6 Minute Walk:  6 Minute Walk     Row Name 06/10/23 1208         6 Minute Walk   Phase Initial     Distance 1310 feet     Walk Time 6 minutes     # of Rest Breaks 0     MPH 2.48     METS 2.93     RPE 10     Perceived Dyspnea  0     VO2 Peak 10.26     Symptoms No     Resting HR 80 bpm     Resting BP 108/66     Resting Oxygen Saturation  94 %     Exercise Oxygen Saturation  during 6 min walk 95 %     Max Ex. HR 101 bpm     Max Ex. BP 122/68     2 Minute Post BP 112/70  Oxygen Initial Assessment:   Oxygen Re-Evaluation:   Oxygen Discharge (Final Oxygen Re-Evaluation):   Initial Exercise Prescription:  Initial Exercise Prescription - 06/10/23 1200       Date of Initial Exercise RX and Referring Provider   Date 06/10/23    Referring Provider Epifanio Lesches, MD    Expected Discharge Date 09/02/23      Treadmill   MPH 2    Grade 0    Minutes 15    METs 2.5      Recumbant Bike   Level 1    RPM 60    Watts 18    Minutes 15    METs 2.5      Prescription Details   Frequency (times per week) 3    Duration Progress to 30 minutes of continuous aerobic without signs/symptoms of physical distress      Intensity   THRR 40-80% of Max Heartrate 61-122    Ratings of Perceived Exertion 11-13    Perceived Dyspnea 0-4      Progression   Progression Continue progressive overload as per policy without signs/symptoms or physical distress.      Resistance Training   Training Prescription Yes    Weight 2    Reps 10-15             Perform Capillary Blood Glucose checks as needed.  Exercise Prescription Changes:   Exercise Prescription Changes     Row Name 06/15/23 1017 06/22/23 1030 07/13/23 1032 07/27/23 1024 08/10/23 1036     Response to Exercise   Blood Pressure (Admit) 142/68 122/64 132/68 122/70 100/58   Blood Pressure (Exercise) 122/58 122/72 132/64 -- --   Blood Pressure (Exit) 120/68 108/70 124/80 110/62 98/60   Heart Rate (Admit) 79 bpm 79 bpm 79 bpm 85 bpm 87 bpm   Heart Rate (Exercise) 115 bpm 111 bpm 119 bpm 123 bpm 111 bpm   Heart Rate (Exit) 89 bpm 88 bpm 88 bpm 96 bpm 97 bpm   Rating of Perceived Exertion (Exercise) 11 11 12 12 12    Symptoms None None None None None   Comments Off to a good start with exercise. Reviewed METs with [name]. -- Reviewed goals with [name]. --   Duration Continue with 30 min of aerobic exercise without signs/symptoms of physical distress. Continue with 30 min of aerobic exercise without signs/symptoms of physical distress. Continue with 30 min of aerobic exercise without signs/symptoms of physical distress. Continue with 30 min of aerobic exercise without signs/symptoms of physical distress. Continue with 30 min of aerobic exercise without signs/symptoms of physical distress.    Intensity THRR unchanged THRR unchanged THRR unchanged THRR unchanged THRR unchanged     Progression   Progression Continue to progress workloads to maintain intensity without signs/symptoms of physical distress. Continue to progress workloads to maintain intensity without signs/symptoms of physical distress. Continue to progress workloads to maintain intensity without signs/symptoms of physical distress. Continue to progress workloads to maintain intensity without signs/symptoms of physical distress. Continue to progress workloads to maintain intensity without signs/symptoms of physical distress.   Average METs 2.4 2.4 2.8 3 3.6     Resistance Training   Training Prescription Yes Yes Yes Yes Yes   Weight 2 2 3  lbs 3 lbs 3 lbs   Reps 10-15 10-15 10-15 10-15 10-15   Time 10 Minutes 10 Minutes 10 Minutes 10 Minutes 10 Minutes     Interval Training   Interval Training No No No No No  Treadmill   MPH 2 2 2.5 2.8 3   Grade 0 0 0 0 0   Minutes 15 15 15 15 15    METs 2.53 2.53 2.91 3.14 3.3     Recumbant Bike   Level 1 2 3 3 4    RPM 74 66 54 56 55   Watts 18 21 28 30  47   Minutes 15 15 15 15 15    METs 2.3 2.3 2.8 2.9 3.9     Home Exercise Plan   Plans to continue exercise at -- -- Home (comment)  Walking, stationary bike [employer] (comment)  Water aerobics at gym. [employer] (comment)  Water aerobics at gym.   Frequency -- -- Add 2 additional days to program exercise sessions. Add 2 additional days to program exercise sessions. Add 2 additional days to program exercise sessions.   Initial Home Exercises Provided -- -- 06/29/23 06/29/23 06/29/23    Row Name 08/24/23 1024             Response to Exercise   Blood Pressure (Admit) 122/64       Blood Pressure (Exit) 120/80       Heart Rate (Admit) 84 bpm       Heart Rate (Exercise) 129 bpm       Heart Rate (Exit) 92 bpm       Rating of Perceived Exertion (Exercise) 11       Symptoms None       Comments Reviewed  THRR increase with [name].       Duration Continue with 30 min of aerobic exercise without signs/symptoms of physical distress.       Intensity THRR New  61-138         Progression   Progression Continue to progress workloads to maintain intensity without signs/symptoms of physical distress.       Average METs 3.6         Resistance Training   Training Prescription Yes       Weight 4 lbs       Reps 10-15       Time 10 Minutes         Interval Training   Interval Training No         Treadmill   MPH 3.5       Grade 0       Minutes 15       METs 3.68         Recumbant Bike   Level 4       RPM 50       Watts 43       Minutes 15       METs 3.6         Home Exercise Plan   Plans to continue exercise at [employer] (comment)  Water aerobics at gym.       Frequency Add 2 additional days to program exercise sessions.       Initial Home Exercises Provided 06/29/23                Exercise Comments:   Exercise Comments     Row Name 06/15/23 1127 06/22/23 1122 06/29/23 1052 07/27/23 1043 08/19/23 1046   Exercise Comments [name] tolerated low intensity exercise well without symptoms. Oriented to the stretching and exercise routine. Reviewed METs with [name]. Reviewed home exercise guidelines and goals with [name]. Reviewed goals/ exercise action plan with [name]. Reviewed METs and goals with [name]. Requested THRR increase.  Row Name 08/24/23 1030           Exercise Comments Received THRR increase and reviewed with [name].                Exercise Goals and Review:   Exercise Goals     Row Name 06/10/23 1109             Exercise Goals   Increase Physical Activity Yes       Intervention Provide advice, education, support and counseling about physical activity/exercise needs.;Develop an individualized exercise prescription for aerobic and resistive training based on initial evaluation findings, risk stratification, comorbidities and participant's personal  goals.       Expected Outcomes Short Term: Attend rehab on a regular basis to increase amount of physical activity.;Long Term: Add in home exercise to make exercise part of routine and to increase amount of physical activity.;Long Term: Exercising regularly at least 3-5 days a week.       Increase Strength and Stamina Yes       Intervention Provide advice, education, support and counseling about physical activity/exercise needs.;Develop an individualized exercise prescription for aerobic and resistive training based on initial evaluation findings, risk stratification, comorbidities and participant's personal goals.       Expected Outcomes Short Term: Perform resistance training exercises routinely during rehab and add in resistance training at home;Short Term: Increase workloads from initial exercise prescription for resistance, speed, and METs.;Long Term: Improve cardiorespiratory fitness, muscular endurance and strength as measured by increased METs and functional capacity ( )       Able to understand and use rate of perceived exertion (RPE) scale Yes       Intervention Provide education and explanation on how to use RPE scale       Expected Outcomes Short Term: Able to use RPE daily in rehab to express subjective intensity level;Long Term:  Able to use RPE to guide intensity level when exercising independently       Knowledge and understanding of Target Heart Rate Range (THRR) Yes       Intervention Provide education and explanation of THRR including how the numbers were predicted and where they are located for reference       Expected Outcomes Short Term: Able to state/look up THRR;Short Term: Able to use daily as guideline for intensity in rehab;Long Term: Able to use THRR to govern intensity when exercising independently       Understanding of Exercise Prescription Yes       Intervention Provide education, explanation, and written materials on patient's individual exercise prescription        Expected Outcomes Short Term: Able to explain program exercise prescription;Long Term: Able to explain home exercise prescription to exercise independently                Exercise Goals Re-Evaluation :  Exercise Goals Re-Evaluation     Row Name 06/15/23 1127 06/29/23 1052 07/27/23 1043 08/19/23 1046 08/24/23 1030     Exercise Goal Re-Evaluation   Exercise Goals Review Increase Physical Activity;Increase Strength and Stamina;Able to understand and use rate of perceived exertion (RPE) scale Increase Physical Activity;Increase Strength and Stamina;Able to understand and use rate of perceived exertion (RPE) scale;Understanding of Exercise Prescription;Knowledge and understanding of Target Heart Rate Range (THRR) Increase Physical Activity;Increase Strength and Stamina;Able to understand and use rate of perceived exertion (RPE) scale;Understanding of Exercise Prescription;Knowledge and understanding of Target Heart Rate Range (THRR) Increase Physical Activity;Increase Strength and Stamina;Able to understand and use  rate of perceived exertion (RPE) scale;Understanding of Exercise Prescription;Knowledge and understanding of Target Heart Rate Range (THRR) Increase Physical Activity;Increase Strength and Stamina;Able to understand and use rate of perceived exertion (RPE) scale;Understanding of Exercise Prescription;Knowledge and understanding of Target Heart Rate Range (THRR)   Comments [name] was able to understand and use RPE scale appropriately. Reviewed exercise prescription with [name]. She is walking 30 minutes 2-3 days/week and plans to start riding her stationary bike. Anjulie states she's been cleared by her cardiologist to resumse water aerobics. She restart water aerobics at the gym tomorrow and plans to participate in water aerobics 60 minutes on Tuesdays and Thursdays. She also has a stationary bike at home that she plans to use for her home exercise. Her goal was to be able to return to water  aerobics. [name] continues to progress well with exericse. She occassionally exceed THRR at fairly light workloads. Will request a THRR increase from her cardiologist. She participates in water aerobics 60 minutes 3 days/week and plans to increase to 5 days/week upon completion of cardiac rehab. She also wants to return to hiking. Received clearance to increase target heart rate range to 40-90% of age predicted heart rate. New THRR 61-138. Reviewed THRR increase with Kairi. [name] wants to make up sessions missed. New graduation date is 09/09/23.   Expected Outcomes Progress workloads as tolerated to help increase cardiorespiratory fitness. [name] will walk or ride her stationary bike 30 minutes 2-3 days/week to achieve 150 minutes of aerobic exercise/week. [place] will participate in water aerobics 60 minutes, 2 days/week and ride her stationary bike as her mode of home exercise. Shayona will continue to progress workloads and participate in water aerobics. Shalea will increase workloads as tolerated within new THRR.            Discharge Exercise Prescription (Final Exercise Prescription Changes):  Exercise Prescription Changes - 08/24/23 1024       Response to Exercise   Blood Pressure (Admit) 122/64    Blood Pressure (Exit) 120/80    Heart Rate (Admit) 84 bpm    Heart Rate (Exercise) 129 bpm    Heart Rate (Exit) 92 bpm    Rating of Perceived Exertion (Exercise) 11    Symptoms None    Comments Reviewed THRR increase with [name].    Duration Continue with 30 min of aerobic exercise without signs/symptoms of physical distress.    Intensity THRR New   61-138     Progression   Progression Continue to progress workloads to maintain intensity without signs/symptoms of physical distress.    Average METs 3.6      Resistance Training   Training Prescription Yes    Weight 4 lbs    Reps 10-15    Time 10 Minutes      Interval Training   Interval Training No      Treadmill   MPH 3.5    Grade 0     Minutes 15    METs 3.68      Recumbant Bike   Level 4    RPM 50    Watts 43    Minutes 15    METs 3.6      Home Exercise Plan   Plans to continue exercise at [employer] (comment)   Water aerobics at gym.   Frequency Add 2 additional days to program exercise sessions.    Initial Home Exercises Provided 06/29/23             Nutrition:  Target Goals:  Understanding of nutrition guidelines, daily intake of sodium 1500mg , cholesterol 200mg , calories 30% from fat and 7% or less from saturated fats, daily to have 5 or more servings of fruits and vegetables.  Biometrics:  Pre Biometrics - 06/10/23 1107       Pre Biometrics   Waist Circumference 34.5 inches    Hip Circumference 38.5 inches    Waist to Hip Ratio 0.9 %    Triceps Skinfold 35 mm    % Body Fat 40.2 %    Grip Strength 24 kg    Flexibility 14 in    Single Leg Stand 30 seconds              Nutrition Therapy Plan and Nutrition Goals:  Nutrition Therapy & Goals - 06/15/23 1151       Nutrition Therapy   Diet Heart Healthy Diet    Drug/Food Interactions Statins/Certain Fruits      Personal Nutrition Goals   Nutrition Goal Patient to identify strategies for reducing cardiovascular risk by attending the [employer] education and nutrition series weekly.    Personal Goal #2 Patient to improve diet quality by using the plate method as a guide for meal planning to include lean protein/plant protein, fruits, vegetables, whole grains, nonfat dairy as part of a well-balanced diet.    Comments [name] has medical history of s/p AVR, CABG x1, breast cancer. Her lipids are well controlled at goal. Patient will benefit from participation in intensive cardiac rehab for nutrition, exercise, and lifestyle modification.      Intervention Plan   Intervention Prescribe, educate and counsel regarding individualized specific dietary modifications aiming towards targeted core components such as weight, hypertension, lipid  management, diabetes, heart failure and other comorbidities.;Nutrition handout(s) given to patient.    Expected Outcomes Short Term Goal: Understand basic principles of dietary content, such as calories, fat, sodium, cholesterol and nutrients.;Long Term Goal: Adherence to prescribed nutrition plan.             Nutrition Assessments:  Nutrition Assessments - 06/15/23 1454       Rate Your Plate Scores   Pre Score 68            MEDIFICTS Score Key: >=70 Need to make dietary changes  40-70 Heart Healthy Diet <= 40 Therapeutic Level Cholesterol Diet   Flowsheet Row INTENSIVE CARDIAC REHAB from 06/15/2023 in [facility]  Picture Your Plate Total Score on Admission 68      Picture Your Plate Scores: <56 Unhealthy dietary pattern with much room for improvement. 41-50 Dietary pattern unlikely to meet recommendations for good health and room for improvement. 51-60 More healthful dietary pattern, with some room for improvement.  >60 Healthy dietary pattern, although there may be some specific behaviors that could be improved.    Nutrition Goals Re-Evaluation:  Nutrition Goals Re-Evaluation     Row Name 06/15/23 1151             Goals   Current Weight 159 lb 13.3 oz (72.5 kg)       Comment LDL 15, HDL 27, A1c 5.7       Expected Outcome [name] has medical history of s/p AVR, CABG x1, breast cancer. Her lipids are well controlled at goal. Patient will benefit from participation in intensive cardiac rehab for nutrition, exercise, and lifestyle modification.                Nutrition Goals Re-Evaluation:  Nutrition Goals  Re-Evaluation     Row Name 06/15/23 1151             Goals   Current Weight 159 lb 13.3 oz (72.5 kg)       Comment LDL 15, HDL 27, A1c 5.7       Expected Outcome [name] has medical history of s/p AVR, CABG x1, breast cancer. Her lipids are well controlled at goal. Patient will benefit from  participation in intensive cardiac rehab for nutrition, exercise, and lifestyle modification.                Nutrition Goals Discharge (Final Nutrition Goals Re-Evaluation):  Nutrition Goals Re-Evaluation - 06/15/23 1151       Goals   Current Weight 159 lb 13.3 oz (72.5 kg)    Comment LDL 15, HDL 27, A1c 5.7    Expected Outcome [name] has medical history of s/p AVR, CABG x1, breast cancer. Her lipids are well controlled at goal. Patient will benefit from participation in intensive cardiac rehab for nutrition, exercise, and lifestyle modification.             Psychosocial: Target Goals: Acknowledge presence or absence of significant depression and/or stress, maximize coping skills, provide positive support system. Participant is able to verbalize types and ability to use techniques and skills needed for reducing stress and depression.  Initial Review & Psychosocial Screening:  Initial Psych Review & Screening - 06/10/23 1110       Initial Review   Current issues with Current Psychotropic Meds      Family Dynamics   Good Support System? Yes   [name] has her son and daughter for support   Comments [name] takes Lexapro and feels it is working for her. She denies any current feelings of anxiety/depression/stress.      Barriers   Psychosocial barriers to participate in program There are no identifiable barriers or psychosocial needs.      Screening Interventions   Interventions Encouraged to exercise;Provide feedback about the scores to participant    Expected Outcomes Short Term goal: Identification and review with participant of any Quality of Life or Depression concerns found by scoring the questionnaire.;Long Term goal: The participant improves quality of Life and PHQ9 Scores as seen by post scores and/or verbalization of changes             Quality of Life Scores:  Quality of Life - 06/10/23 1124       Quality of Life   Select Quality of Life      Quality of Life  Scores   Health/Function Pre 25 %    Socioeconomic Pre 30 %    Psych/Spiritual Pre 27.5 %    Family Pre 27 %    GLOBAL Pre 26.79 %            Scores of 19 and below usually indicate a poorer quality of life in these areas.  A difference of  2-3 points is a clinically meaningful difference.  A difference of 2-3 points in the total score of the Quality of Life Index has been associated with significant improvement in overall quality of life, self-image, physical symptoms, and general health in studies assessing change in quality of life.  PHQ-9: Review Flowsheet       06/10/2023 01/30/2014  Depression screen PHQ 2/9  Decreased Interest 0 -  Down, Depressed, Hopeless 0 0  PHQ - 2 Score 0 0  Altered sleeping 2 -  Tired, decreased energy 0 -  Change in appetite 0 -  Feeling bad or failure about yourself  0 -  Trouble concentrating 0 -  Moving slowly or fidgety/restless 0 -  Suicidal thoughts 0 -  PHQ-9 Score 2 -  Difficult doing work/chores Not difficult at all -   Interpretation of Total Score  Total Score Depression Severity:  1-4 = Minimal depression, 5-9 = Mild depression, 10-14 = Moderate depression, 15-19 = Moderately severe depression, 20-27 = Severe depression   Psychosocial Evaluation and Intervention:   Psychosocial Re-Evaluation:  Psychosocial Re-Evaluation     Row Name 06/15/23 1154 06/30/23 1258 07/23/23 1434 08/20/23 1311       Psychosocial Re-Evaluation   Current issues with Current Psychotropic Meds Current Psychotropic Meds;Current Stress Concerns Current Psychotropic Meds;Current Stress Concerns Current Psychotropic Meds;Current Stress Concerns    Comments [name] did not voice any increased concerns or stressors on her first day of exercise. [name] voiced concerns about her recent echo report. [name] will discuss with Dr Bjorn Pippin on 07/06/23. [name] is switching to the 0645 class in January 2025. [name] has not voiced any increased concerns or stressors during  exercise at cardiac rehab. [name] says she has decided to stay in the 1015 class [name] continues not to voice any increased concerns or stressors during exercise at cardiac rehab. [name] will complete cardiac rehab on 09/02/23.    Interventions Encouraged to attend Cardiac Rehabilitation for the exercise;Stress management education;Relaxation education Encouraged to attend Cardiac Rehabilitation for the exercise;Stress management education;Relaxation education Encouraged to attend Cardiac Rehabilitation for the exercise;Stress management education;Relaxation education Encouraged to attend Cardiac Rehabilitation for the exercise;Stress management education;Relaxation education    Continue Psychosocial Services  No Follow up required Follow up required by staff Follow up required by staff Follow up required by staff      Initial Review   Source of Stress Concerns -- Chronic Illness Chronic Illness Chronic Illness    Comments -- Will continue to monitor and offer support as needed. Will continue to monitor and offer support as needed. Will continue to monitor and offer support as needed.             Psychosocial Discharge (Final Psychosocial Re-Evaluation):  Psychosocial Re-Evaluation - 08/20/23 1311       Psychosocial Re-Evaluation   Current issues with Current Psychotropic Meds;Current Stress Concerns    Comments Samiya continues not to voice any increased concerns or stressors during exercise at cardiac rehab. Brenda will complete cardiac rehab on 09/02/23.    Interventions Encouraged to attend Cardiac Rehabilitation for the exercise;Stress management education;Relaxation education    Continue Psychosocial Services  Follow up required by staff      Initial Review   Source of Stress Concerns Chronic Illness    Comments Will continue to monitor and offer support as needed.             Vocational Rehabilitation: Provide vocational rehab assistance to qualifying candidates.   Vocational  Rehab Evaluation & Intervention:  Vocational Rehab - 06/10/23 1115       Initial Vocational Rehab Evaluation & Intervention   Assessment shows need for Vocational Rehabilitation No   [name] is retired            Education: Education Goals: Education classes will be provided on a weekly basis, covering required topics. Participant will state understanding/return demonstration of topics presented.    Education     Row Name 06/15/23 1200     Education   Cardiac Education Topics Pritikin   Select Core  Videos     Core Videos   Educator Exercise Physiologist   Select Psychosocial   Psychosocial Healthy Minds, Bodies, Hearts   Instruction Review Code 1- Verbalizes Understanding   Class Start Time 1142   Class Stop Time 1218   Class Time Calculation (min) 36 min    Row Name 06/24/23 1100     Education   Cardiac Education Topics Pritikin   Customer service manager   Weekly Topic Tasty Appetizers and Snacks   Instruction Review Code 1- Verbalizes Understanding   Class Start Time 1145   Class Stop Time 1223   Class Time Calculation (min) 38 min    Row Name 06/26/23 1500     Education   Cardiac Education Topics Pritikin   Glass blower/designer Nutrition   Nutrition Workshop Label Reading   Instruction Review Code 1- Verbalizes Understanding   Class Start Time 1144   Class Stop Time 1219   Class Time Calculation (min) 35 min    Row Name 06/29/23 1600     Education   Cardiac Education Topics Pritikin   Acupuncturist     Workshops   Educator Exercise Physiologist   Select Psychosocial   Psychosocial Workshop Recognizing and Reducing Stress   Instruction Review Code 1- Verbalizes Understanding   Class Start Time 1142   Class Stop Time 1231   Class Time Calculation (min) 49 min    Row Name 07/01/23 1400     Education   Cardiac Education Topics Pritikin   Hydrologist   Educator Dietitian   Weekly Topic Efficiency Cooking - Meals in a Snap   Instruction Review Code 1- Verbalizes Understanding   Class Start Time 1143   Class Stop Time 1226   Class Time Calculation (min) 43 min    Row Name 07/03/23 1500     Education   Cardiac Education Topics Pritikin   Nurse, children's   Educator Dietitian   Select Nutrition   Nutrition Calorie Density   Instruction Review Code 1- Verbalizes Understanding   Class Start Time 1149   Class Stop Time 1227   Class Time Calculation (min) 38 min    Row Name 07/13/23 1200     Education   Cardiac Education Topics Pritikin   Psychologist, forensic General Education   General Education Hypertension and Heart Disease   Instruction Review Code 1- Verbalizes Understanding   Class Start Time 1147   Class Stop Time 1221   Class Time Calculation (min) 34 min    Row Name 07/17/23 1400     Education   Cardiac Education Topics Pritikin   Glass blower/designer Nutrition   Nutrition Workshop Targeting Your Nutrition Priorities   Instruction Review Code 1- Verbalizes Understanding   Class Start Time 1145   Class Stop Time 1228   Class Time Calculation (min) 43 min    Row Name 07/24/23 1300     Education   Cardiac Education Topics Pritikin   Licensed conveyancer Nutrition   Nutrition Dining Out - Part 1   Instruction Review  Code 1- Verbalizes Understanding   Class Start Time 1146   Class Stop Time 1234   Class Time Calculation (min) 48 min    Row Name 07/27/23 1100     Education   Cardiac Education Topics Pritikin   Select Core Videos     Core Videos   Educator Exercise Physiologist   Select Exercise Education   Exercise Education Biomechanial Limitations   Instruction Review Code 1- Verbalizes Understanding    Class Start Time 1144   Class Stop Time 1217   Class Time Calculation (min) 33 min    Row Name 07/29/23 1400     Education   Cardiac Education Topics Pritikin   Customer service manager   Weekly Topic Comforting Weekend Breakfasts   Instruction Review Code 1- Verbalizes Understanding   Class Start Time 1145   Class Stop Time 1230   Class Time Calculation (min) 45 min    Row Name 08/03/23 1100     Education   Cardiac Education Topics Pritikin   Psychologist, forensic Exercise Education   Exercise Education Improving Performance   Instruction Review Code 1- Verbalizes Understanding   Class Start Time 1144   Class Stop Time 1231   Class Time Calculation (min) 47 min    Row Name 08/07/23 1300     Education   Cardiac Education Topics Pritikin   Glass blower/designer Nutrition   Nutrition Workshop Fueling a Forensic psychologist   Instruction Review Code 1- TEFL teacher Understanding   Class Start Time 1145   Class Stop Time 1228   Class Time Calculation (min) 43 min    Row Name 08/10/23 1300     Education   Cardiac Education Topics Pritikin   Geographical information systems officer Psychosocial   Psychosocial Workshop Healthy Sleep for a Healthy Heart   Instruction Review Code 1- Verbalizes Understanding   Class Start Time 1155   Class Stop Time 1245   Class Time Calculation (min) 50 min    Row Name 08/12/23 1200     Education   Cardiac Education Topics Pritikin   Customer service manager   Weekly Topic International Cuisine- Spotlight on the [employer] Zones   Instruction Review Code 1- Verbalizes Understanding   Class Start Time 1145   Class Stop Time 1230   Class Time Calculation (min) 45 min    Row Name 08/14/23 1100     Education   Cardiac Education Topics  Pritikin   Nurse, children's Exercise Physiologist   Select Psychosocial   Psychosocial How Our Thoughts Can Heal Our Hearts   Instruction Review Code 1- Verbalizes Understanding   Class Start Time 1152   Class Stop Time 1226   Class Time Calculation (min) 34 min    Row Name 08/17/23 1300     Education   Cardiac Education Topics Pritikin   Select Workshops     Workshops   Educator Exercise Physiologist   Select Exercise   Exercise Workshop Managing Heart Disease: Your Path to a Healthier Heart   Instruction Review Code 1- Verbalizes Understanding   Class Start Time 1142   Class Stop  Time 1238   Class Time Calculation (min) 56 min    Row Name 08/19/23 1400     Education   Cardiac Education Topics Pritikin   Customer service manager   Weekly Topic Simple Sides and Sauces   Instruction Review Code 1- Verbalizes Understanding   Class Start Time 1145   Class Stop Time 1226   Class Time Calculation (min) 41 min    Row Name 08/24/23 1100     Education   Cardiac Education Topics Pritikin   Geographical information systems officer Psychosocial   Psychosocial Workshop From Head to Heart: The Power of a Healthy Outlook   Instruction Review Code 1- Verbalizes Understanding   Class Start Time 1148   Class Stop Time 1235   Class Time Calculation (min) 47 min            Core Videos: Exercise    Move It!  Clinical staff conducted group or individual video education with verbal and written material and guidebook.  Patient learns the recommended Pritikin exercise program. Exercise with the goal of living a long, healthy life. Some of the health benefits of exercise include controlled diabetes, healthier blood pressure levels, improved cholesterol levels, improved heart and lung capacity, improved sleep, and better body composition. Everyone should speak with their doctor  before starting or changing an exercise routine.  Biomechanical Limitations Clinical staff conducted group or individual video education with verbal and written material and guidebook.  Patient learns how biomechanical limitations can impact exercise and how we can mitigate and possibly overcome limitations to have an impactful and balanced exercise routine.  Body Composition Clinical staff conducted group or individual video education with verbal and written material and guidebook.  Patient learns that body composition (ratio of muscle mass to fat mass) is a key component to assessing overall fitness, rather than body weight alone. Increased fat mass, especially visceral belly fat, can put [name] at increased risk for metabolic syndrome, type 2 diabetes, heart disease, and even death. It is recommended to combine diet and exercise (cardiovascular and resistance training) to improve your body composition. Seek guidance from your physician and exercise physiologist before implementing an exercise routine.  Exercise Action Plan Clinical staff conducted group or individual video education with verbal and written material and guidebook.  Patient learns the recommended strategies to achieve and enjoy long-term exercise adherence, including variety, self-motivation, self-efficacy, and positive decision making. Benefits of exercise include fitness, good health, weight management, more energy, better sleep, less stress, and overall well-being.  Medical   Heart Disease Risk Reduction Clinical staff conducted group or individual video education with verbal and written material and guidebook.  Patient learns our heart is our most vital organ as it circulates oxygen, nutrients, white blood cells, and hormones throughout the entire body, and carries waste away. Data supports a plant-based eating plan like the Pritikin Program for its effectiveness in slowing progression of and reversing heart disease. The video  provides a number of recommendations to address heart disease.   Metabolic Syndrome and Belly Fat  Clinical staff conducted group or individual video education with verbal and written material and guidebook.  Patient learns what metabolic syndrome is, how it leads to heart disease, and how one can reverse it and keep it from coming back. You have metabolic syndrome if you have 3 of the following 5 criteria: abdominal obesity, high  blood pressure, high triglycerides, low HDL cholesterol, and high blood sugar.  Hypertension and Heart Disease Clinical staff conducted group or individual video education with verbal and written material and guidebook.  Patient learns that high blood pressure, or hypertension, is very common in the [place]. Hypertension is largely due to excessive salt intake, but other important risk factors include being overweight, physical inactivity, drinking too much alcohol, smoking, and not eating enough potassium from fruits and vegetables. High blood pressure is a leading risk factor for heart attack, stroke, congestive heart failure, dementia, Cox failure, and premature death. Long-term effects of excessive salt intake include stiffening of the arteries and thickening of heart muscle and organ damage. Recommendations include ways to reduce hypertension and the risk of heart disease.  Diseases of Our Time - Focusing on Diabetes Clinical staff conducted group or individual video education with verbal and written material and guidebook.  Patient learns why the best way to stop diseases of our time is prevention, through food and other lifestyle changes. Medicine (such as prescription pills and surgeries) is often only a Band-Aid on the problem, not a long-term solution. Most common diseases of our time include obesity, type 2 diabetes, hypertension, heart disease, and cancer. The Pritikin Program is recommended and has been proven to help reduce, reverse, and/or prevent the  damaging effects of metabolic syndrome.  Nutrition   Overview of the Pritikin Eating Plan  Clinical staff conducted group or individual video education with verbal and written material and guidebook.  Patient learns about the Pritikin Eating Plan for disease risk reduction. The Pritikin Eating Plan emphasizes a wide variety of unrefined, minimally-processed carbohydrates, like fruits, vegetables, whole grains, and legumes. Go, Caution, and Stop food choices are explained. Plant-based and lean animal proteins are emphasized. Rationale provided for low sodium intake for blood pressure control, low added sugars for blood sugar stabilization, and low added fats and oils for coronary artery disease risk reduction and weight management.  Calorie Density  Clinical staff conducted group or individual video education with verbal and written material and guidebook.  Patient learns about calorie density and how it impacts the Pritikin Eating Plan. Knowing the characteristics of the food you choose will help you decide whether those foods will lead to weight gain or weight loss, and whether you want to consume more or less of them. Weight loss is usually a side effect of the Pritikin Eating Plan because of its focus on low calorie-dense foods.  Label Reading  Clinical staff conducted group or individual video education with verbal and written material and guidebook.  Patient learns about the Pritikin recommended label reading guidelines and corresponding recommendations regarding calorie density, added sugars, sodium content, and whole grains.  Dining Out - Part 1  Clinical staff conducted group or individual video education with verbal and written material and guidebook.  Patient learns that restaurant meals can be sabotaging because they can be so high in calories, fat, sodium, and/or sugar. Patient learns recommended strategies on how to positively address this and avoid unhealthy pitfalls.  Facts on Fats   Clinical staff conducted group or individual video education with verbal and written material and guidebook.  Patient learns that lifestyle modifications can be just as effective, if not more so, as many medications for lowering your risk of heart disease. A Pritikin lifestyle can help to reduce your risk of inflammation and atherosclerosis (cholesterol build-up, or plaque, in the artery walls). Lifestyle interventions such as dietary choices and physical activity address  the cause of atherosclerosis. A review of the types of fats and their impact on blood cholesterol levels, along with dietary recommendations to reduce fat intake is also included.  Nutrition Action Plan  Clinical staff conducted group or individual video education with verbal and written material and guidebook.  Patient learns how to incorporate Pritikin recommendations into their lifestyle. Recommendations include planning and keeping personal health goals in mind as an important part of their success.  Healthy Mind-Set    Healthy Minds, Bodies, Hearts  Clinical staff conducted group or individual video education with verbal and written material and guidebook.  Patient learns how to identify when they are stressed. Video will discuss the impact of that stress, as well as the many benefits of stress management. Patient will also be introduced to stress management techniques. The way we think, act, and feel has an impact on our hearts.  How Our Thoughts Can Heal Our Hearts  Clinical staff conducted group or individual video education with verbal and written material and guidebook.  Patient learns that negative thoughts can cause depression and anxiety. This can result in negative lifestyle behavior and serious health problems. Cognitive behavioral therapy is an effective method to help control our thoughts in order to change and improve our emotional outlook.  Additional Videos:  Exercise    Improving Performance  Clinical  staff conducted group or individual video education with verbal and written material and guidebook.  Patient learns to use a non-linear approach by alternating intensity levels and lengths of time spent exercising to help burn more calories and lose more body fat. Cardiovascular exercise helps improve heart health, metabolism, hormonal balance, blood sugar control, and recovery from fatigue. Resistance training improves strength, endurance, balance, coordination, reaction time, metabolism, and muscle mass. Flexibility exercise improves circulation, posture, and balance. Seek guidance from your physician and exercise physiologist before implementing an exercise routine and learn your capabilities and proper form for all exercise.  Introduction to Yoga  Clinical staff conducted group or individual video education with verbal and written material and guidebook.  Patient learns about yoga, a discipline of the coming together of mind, breath, and body. The benefits of yoga include improved flexibility, improved range of motion, better posture and core strength, increased lung function, weight loss, and positive self-image. Yoga's heart health benefits include lowered blood pressure, healthier heart rate, decreased cholesterol and triglyceride levels, improved immune function, and reduced stress. Seek guidance from your physician and exercise physiologist before implementing an exercise routine and learn your capabilities and proper form for all exercise.  Medical   Aging: Enhancing Your Quality of Life  Clinical staff conducted group or individual video education with verbal and written material and guidebook.  Patient learns key strategies and recommendations to stay in good physical health and enhance quality of life, such as prevention strategies, having an advocate, securing a Health Care Proxy and Power of Attorney, and keeping a list of medications and system for tracking them. It also discusses how to  avoid risk for bone loss.  Biology of Weight Control  Clinical staff conducted group or individual video education with verbal and written material and guidebook.  Patient learns that weight gain occurs because we consume more calories than we burn (eating more, moving less). Even if your body weight is normal, you may have higher ratios of fat compared to muscle mass. Too much body fat puts you at increased risk for cardiovascular disease, heart attack, stroke, type 2 diabetes, and obesity-related cancers. In  addition to exercise, following the Pritikin Eating Plan can help reduce your risk.  Decoding Lab Results  Clinical staff conducted group or individual video education with verbal and written material and guidebook.  Patient learns that lab test reflects one measurement whose values change over time and are influenced by many factors, including medication, stress, sleep, exercise, food, hydration, pre-existing medical conditions, and more. It is recommended to use the knowledge from this video to become more involved with your lab results and evaluate your numbers to speak with your doctor.   Diseases of Our Time - Overview  Clinical staff conducted group or individual video education with verbal and written material and guidebook.  Patient learns that according to the CDC, 50% to 70% of chronic diseases (such as obesity, type 2 diabetes, elevated lipids, hypertension, and heart disease) are avoidable through lifestyle improvements including healthier food choices, listening to satiety cues, and increased physical activity.  Sleep Disorders Clinical staff conducted group or individual video education with verbal and written material and guidebook.  Patient learns how good quality and duration of sleep are important to overall health and well-being. Patient also learns about sleep disorders and how they impact health along with recommendations to address them, including discussing with a  physician.  Nutrition  Dining Out - Part 2 Clinical staff conducted group or individual video education with verbal and written material and guidebook.  Patient learns how to plan ahead and communicate in order to maximize their dining experience in a healthy and nutritious manner. Included are recommended food choices based on the type of restaurant the patient is visiting.   Fueling a Banker conducted group or individual video education with verbal and written material and guidebook.  There is a strong connection between our food choices and our health. Diseases like obesity and type 2 diabetes are very prevalent and are in large-part due to lifestyle choices. The Pritikin Eating Plan provides plenty of food and hunger-curbing satisfaction. It is easy to follow, affordable, and helps reduce health risks.  Menu Workshop  Clinical staff conducted group or individual video education with verbal and written material and guidebook.  Patient learns that restaurant meals can sabotage health goals because they are often packed with calories, fat, sodium, and sugar. Recommendations include strategies to plan ahead and to communicate with the manager, chef, or server to help order a healthier meal.  Planning Your Eating Strategy  Clinical staff conducted group or individual video education with verbal and written material and guidebook.  Patient learns about the Pritikin Eating Plan and its benefit of reducing the risk of disease. The Pritikin Eating Plan does not focus on calories. Instead, it emphasizes high-quality, nutrient-rich foods. By knowing the characteristics of the foods, we choose, we can determine their calorie density and make informed decisions.  Targeting Your Nutrition Priorities  Clinical staff conducted group or individual video education with verbal and written material and guidebook.  Patient learns that lifestyle habits have a tremendous impact on disease  risk and progression. This video provides eating and physical activity recommendations based on your personal health goals, such as reducing LDL cholesterol, losing weight, preventing or controlling type 2 diabetes, and reducing high blood pressure.  Vitamins and Minerals  Clinical staff conducted group or individual video education with verbal and written material and guidebook.  Patient learns different ways to obtain key vitamins and minerals, including through a recommended healthy diet. It is important to discuss all supplements you take  with your doctor.   Healthy Mind-Set    Smoking Cessation  Clinical staff conducted group or individual video education with verbal and written material and guidebook.  Patient learns that cigarette smoking and tobacco addiction pose a serious health risk which affects millions of people. Stopping smoking will significantly reduce the risk of heart disease, lung disease, and many forms of cancer. Recommended strategies for quitting are covered, including working with your doctor to develop a successful plan.  Culinary   Becoming a Set designer conducted group or individual video education with verbal and written material and guidebook.  Patient learns that cooking at home can be healthy, cost-effective, quick, and puts them in control. Keys to cooking healthy recipes will include looking at your recipe, assessing your equipment needs, planning ahead, making it simple, choosing cost-effective seasonal ingredients, and limiting the use of added fats, salts, and sugars.  Cooking - Breakfast and Snacks  Clinical staff conducted group or individual video education with verbal and written material and guidebook.  Patient learns how important breakfast is to satiety and nutrition through the entire day. Recommendations include key foods to eat during breakfast to help stabilize blood sugar levels and to prevent overeating at meals later in the day.  Planning ahead is also a key component.  Cooking - Educational psychologist conducted group or individual video education with verbal and written material and guidebook.  Patient learns eating strategies to improve overall health, including an approach to cook more at home. Recommendations include thinking of animal protein as a side on your plate rather than center stage and focusing instead on lower calorie dense options like vegetables, fruits, whole grains, and plant-based proteins, such as beans. Making sauces in large quantities to freeze for later and leaving the skin on your vegetables are also recommended to maximize your experience.  Cooking - Healthy Salads and Dressing Clinical staff conducted group or individual video education with verbal and written material and guidebook.  Patient learns that vegetables, fruits, whole grains, and legumes are the foundations of the Pritikin Eating Plan. Recommendations include how to incorporate each of these in flavorful and healthy salads, and how to create homemade salad dressings. Proper handling of ingredients is also covered. Cooking - Soups and [employer] - Soups and Desserts Clinical staff conducted group or individual video education with verbal and written material and guidebook.  Patient learns that Pritikin soups and desserts make for easy, nutritious, and delicious snacks and meal components that are low in sodium, fat, sugar, and calorie density, while high in vitamins, minerals, and filling fiber. Recommendations include simple and healthy ideas for soups and desserts.   Overview     The Pritikin Solution Program Overview Clinical staff conducted group or individual video education with verbal and written material and guidebook.  Patient learns that the results of the Pritikin Program have been documented in more than 100 articles published in peer-reviewed journals, and the benefits include reducing risk factors for  (and, in some cases, even reversing) high cholesterol, high blood pressure, type 2 diabetes, obesity, and more! An overview of the three key pillars of the Pritikin Program will be covered: eating well, doing regular exercise, and having a healthy mind-set.  WORKSHOPS  Exercise: Exercise Basics: Building Your Action Plan Clinical staff led group instruction and group discussion with PowerPoint presentation and patient guidebook. To enhance the learning environment the use of posters, models and videos may be added. At the  conclusion of this workshop, patients will comprehend the difference between physical activity and exercise, as well as the benefits of incorporating both, into their routine. Patients will understand the FITT (Frequency, Intensity, Time, and Type) principle and how to use it to build an exercise action plan. In addition, safety concerns and other considerations for exercise and cardiac rehab will be addressed by the presenter. The purpose of this lesson is to promote a comprehensive and effective weekly exercise routine in order to improve patients' overall level of fitness.   Managing Heart Disease: Your Path to a Healthier Heart Clinical staff led group instruction and group discussion with PowerPoint presentation and patient guidebook. To enhance the learning environment the use of posters, models and videos may be added.At the conclusion of this workshop, patients will understand the anatomy and physiology of the heart. Additionally, they will understand how Pritikin's three pillars impact the risk factors, the progression, and the management of heart disease.  The purpose of this lesson is to provide a high-level overview of the heart, heart disease, and how the Pritikin lifestyle positively impacts risk factors.  Exercise Biomechanics Clinical staff led group instruction and group discussion with PowerPoint presentation and patient guidebook. To enhance the learning  environment the use of posters, models and videos may be added. Patients will learn how the structural parts of their bodies function and how these functions impact their daily activities, movement, and exercise. Patients will learn how to promote a neutral spine, learn how to manage pain, and identify ways to improve their physical movement in order to promote healthy living. The purpose of this lesson is to expose patients to common physical limitations that impact physical activity. Participants will learn practical ways to adapt and manage aches and pains, and to minimize their effect on regular exercise. Patients will learn how to maintain good posture while sitting, walking, and lifting.  Balance Training and Fall Prevention  Clinical staff led group instruction and group discussion with PowerPoint presentation and patient guidebook. To enhance the learning environment the use of posters, models and videos may be added. At the conclusion of this workshop, patients will understand the importance of their sensorimotor skills (vision, proprioception, and the vestibular system) in maintaining their ability to balance as they age. Patients will apply a variety of balancing exercises that are appropriate for their current level of function. Patients will understand the common causes for poor balance, possible solutions to these problems, and ways to modify their physical environment in order to minimize their fall risk. The purpose of this lesson is to teach patients about the importance of maintaining balance as they age and ways to minimize their risk of falling.  WORKSHOPS   Nutrition:  Fueling a Ship broker led group instruction and group discussion with PowerPoint presentation and patient guidebook. To enhance the learning environment the use of posters, models and videos may be added. Patients will review the foundational principles of the Pritikin Eating Plan and understand  what constitutes a serving size in each of the food groups. Patients will also learn Pritikin-friendly foods that are better choices when away from home and review make-ahead meal and snack options. Calorie density will be reviewed and applied to three nutrition priorities: weight maintenance, weight loss, and weight gain. The purpose of this lesson is to reinforce (in a group setting) the key concepts around what patients are recommended to eat and how to apply these guidelines when away from home by planning and selecting Pritikin-friendly  options. Patients will understand how calorie density may be adjusted for different weight management goals.  Mindful Eating  Clinical staff led group instruction and group discussion with PowerPoint presentation and patient guidebook. To enhance the learning environment the use of posters, models and videos may be added. Patients will briefly review the concepts of the Pritikin Eating Plan and the importance of low-calorie dense foods. The concept of mindful eating will be introduced as well as the importance of paying attention to internal hunger signals. Triggers for non-hunger eating and techniques for dealing with triggers will be explored. The purpose of this lesson is to provide patients with the opportunity to review the basic principles of the Pritikin Eating Plan, discuss the value of eating mindfully and how to measure internal cues of hunger and fullness using the Hunger Scale. Patients will also discuss reasons for non-hunger eating and learn strategies to use for controlling emotional eating.  Targeting Your Nutrition Priorities Clinical staff led group instruction and group discussion with PowerPoint presentation and patient guidebook. To enhance the learning environment the use of posters, models and videos may be added. Patients will learn how to determine their genetic susceptibility to disease by reviewing their family history. Patients will gain  insight into the importance of diet as part of an overall healthy lifestyle in mitigating the impact of genetics and other environmental insults. The purpose of this lesson is to provide patients with the opportunity to assess their personal nutrition priorities by looking at their family history, their own health history and current risk factors. Patients will also be able to discuss ways of prioritizing and modifying the Pritikin Eating Plan for their highest risk areas  Menu  Clinical staff led group instruction and group discussion with PowerPoint presentation and patient guidebook. To enhance the learning environment the use of posters, models and videos may be added. Using menus brought in from [employer], or printed from [employer], patients will apply the Pritikin dining out guidelines that were presented in the [employer] video. Patients will also be able to practice these guidelines in a variety of provided scenarios. The purpose of this lesson is to provide patients with the opportunity to practice hands-on learning of the Pritikin Dining Out guidelines with actual menus and practice scenarios.  Label Reading Clinical staff led group instruction and group discussion with PowerPoint presentation and patient guidebook. To enhance the learning environment the use of posters, models and videos may be added. Patients will review and discuss the Pritikin label reading guidelines presented in Pritikin's Label Reading Educational series video. Using fool labels brought in from local grocery stores and markets, patients will apply the label reading guidelines and determine if the packaged food meet the Pritikin guidelines. The purpose of this lesson is to provide patients with the opportunity to review, discuss, and practice hands-on learning of the Pritikin Label Reading guidelines with actual packaged food labels. Cooking School  Pritikin's [employer] are  designed to teach patients ways to prepare quick, simple, and affordable recipes at home. The importance of nutrition's role in chronic disease risk reduction is reflected in its emphasis in the overall Pritikin program. By learning how to prepare essential core Pritikin Eating Plan recipes, patients will increase control over what they eat; be able to customize the flavor of foods without the use of added salt, sugar, or fat; and improve the quality of the food they consume. By learning a set of core recipes which are easily assembled,  quickly prepared, and affordable, patients are more likely to prepare more healthy foods at home. These workshops focus on convenient breakfasts, simple entres, side dishes, and desserts which can be prepared with minimal effort and are consistent with nutrition recommendations for cardiovascular risk reduction. Cooking [employer] are taught by a Armed forces logistics/support/administrative officer (RD) who has been trained by the [employer]. The chef or RD has a clear understanding of the importance of minimizing - if not completely eliminating - added fat, sugar, and sodium in recipes. Throughout the series of Cooking School Workshop sessions, patients will learn about healthy ingredients and efficient methods of cooking to build confidence in their capability to prepare    Cooking School weekly topics:  Adding Flavor- Sodium-Free  Fast and Healthy Breakfasts  Powerhouse Plant-Based Proteins  Satisfying Salads and Dressings  Simple Sides and Sauces  International Cuisine-Spotlight on the [employer] Zones  Delicious Desserts  Savory Soups  [employer] - Meals in a Astronomer Appetizers and Snacks  Comforting Weekend Breakfasts  One-Pot Wonders   Fast Evening Meals  Landscape architect Your Pritikin Plate  WORKSHOPS   Healthy Mindset (Psychosocial):  Focused Goals, Sustainable Changes Clinical staff led group instruction and group discussion  with PowerPoint presentation and patient guidebook. To enhance the learning environment the use of posters, models and videos may be added. Patients will be able to apply effective goal setting strategies to establish at least one personal goal, and then take consistent, meaningful action toward that goal. They will learn to identify common barriers to achieving personal goals and develop strategies to overcome them. Patients will also gain an understanding of how our mind-set can impact our ability to achieve goals and the importance of cultivating a positive and growth-oriented mind-set. The purpose of this lesson is to provide patients with a deeper understanding of how to set and achieve personal goals, as well as the tools and strategies needed to overcome common obstacles which may arise along the way.  From Head to Heart: The Power of a Healthy Outlook  Clinical staff led group instruction and group discussion with PowerPoint presentation and patient guidebook. To enhance the learning environment the use of posters, models and videos may be added. Patients will be able to recognize and describe the impact of emotions and mood on physical health. They will discover the importance of self-care and explore self-care practices which may work for them. Patients will also learn how to utilize the 4 C's to cultivate a healthier outlook and better manage stress and challenges. The purpose of this lesson is to demonstrate to patients how a healthy outlook is an essential part of maintaining good health, especially as they continue their cardiac rehab journey.  Healthy Sleep for a Healthy Heart Clinical staff led group instruction and group discussion with PowerPoint presentation and patient guidebook. To enhance the learning environment the use of posters, models and videos may be added. At the conclusion of this workshop, patients will be able to demonstrate knowledge of the importance of sleep to overall  health, well-being, and quality of life. They will understand the symptoms of, and treatments for, common sleep disorders. Patients will also be able to identify daytime and nighttime behaviors which impact sleep, and they will be able to apply these tools to help manage sleep-related challenges. The purpose of this lesson is to provide patients with a general overview of sleep and outline the importance of quality sleep. Patients will learn about  a few of the most common sleep disorders. Patients will also be introduced to the concept of "sleep hygiene," and discover ways to self-manage certain sleeping problems through simple daily behavior changes. Finally, the workshop will motivate patients by clarifying the links between quality sleep and their goals of heart-healthy living.   Recognizing and Reducing Stress Clinical staff led group instruction and group discussion with PowerPoint presentation and patient guidebook. To enhance the learning environment the use of posters, models and videos may be added. At the conclusion of this workshop, patients will be able to understand the types of stress reactions, differentiate between acute and chronic stress, and recognize the impact that chronic stress has on their health. They will also be able to apply different coping mechanisms, such as reframing negative self-talk. Patients will have the opportunity to practice a variety of stress management techniques, such as deep abdominal breathing, progressive muscle relaxation, and/or guided imagery.  The purpose of this lesson is to educate patients on the role of stress in their lives and to provide healthy techniques for coping with it.  Learning Barriers/Preferences:  Learning Barriers/Preferences - 06/10/23 1115       Learning Barriers/Preferences   Learning Barriers Sight   contacts/glasses   Learning Preferences Audio;Computer/Internet;Group Instruction;Individual Instruction;Pictoral;Skilled  Demonstration;Verbal Instruction;Video;Written Material             Education Topics:  Knowledge Questionnaire Score:  Knowledge Questionnaire Score - 06/10/23 1115       Knowledge Questionnaire Score   Pre Score 22/24             Core Components/Risk Factors/Patient Goals at Admission:  Personal Goals and Risk Factors at Admission - 06/10/23 1115       Core Components/Risk Factors/Patient Goals on Admission    Weight Management Yes;Weight Loss    Intervention Weight Management: Develop a combined nutrition and exercise program designed to reach desired caloric intake, while maintaining appropriate intake of nutrient and fiber, sodium and fats, and appropriate energy expenditure required for the weight goal.;Weight Management: Provide education and appropriate resources to help participant work on and attain dietary goals.    Expected Outcomes Long Term: Adherence to nutrition and physical activity/exercise program aimed toward attainment of established weight goal;Short Term: Continue to assess and modify interventions until short term weight is achieved;Weight Loss: Understanding of general recommendations for a balanced deficit meal plan, which promotes 1-2 lb weight loss per week and includes a negative energy balance of [phone] kcal/d;Understanding recommendations for meals to include 15-35% energy as protein, 25-35% energy from fat, 35-60% energy from carbohydrates, less than 200mg  of dietary cholesterol, 20-35 gm of total fiber daily;Understanding of distribution of calorie intake throughout the day with the consumption of 4-5 meals/snacks    Hypertension Yes    Intervention Provide education on lifestyle modifcations including regular physical activity/exercise, weight management, moderate sodium restriction and increased consumption of fresh fruit, vegetables, and low fat dairy, alcohol moderation, and smoking cessation.;Monitor prescription use compliance.    Expected  Outcomes Short Term: Continued assessment and intervention until BP is < 140/3mm HG in hypertensive participants. < 130/79mm HG in hypertensive participants with diabetes, heart failure or chronic Cox disease.;Long Term: Maintenance of blood pressure at goal levels.    Lipids Yes    Intervention Provide education and support for participant on nutrition & aerobic/resistive exercise along with prescribed medications to achieve LDL 70mg , HDL >40mg .    Expected Outcomes Short Term: Participant states understanding of desired cholesterol values and is compliant  with medications prescribed. Participant is following exercise prescription and nutrition guidelines.;Long Term: Cholesterol controlled with medications as prescribed, with individualized exercise RX and with personalized nutrition plan. Value goals: LDL < 70mg , HDL > 40 mg.    Stress Yes    Intervention Offer individual and/or small group education and counseling on adjustment to heart disease, stress management and health-related lifestyle change. Teach and support self-help strategies.;Refer participants experiencing significant psychosocial distress to appropriate mental health specialists for further evaluation and treatment. When possible, include family members and significant others in education/counseling sessions.    Expected Outcomes Short Term: Participant demonstrates changes in health-related behavior, relaxation and other stress management skills, ability to obtain effective social support, and compliance with psychotropic medications if prescribed.;Long Term: Emotional wellbeing is indicated by absence of clinically significant psychosocial distress or social isolation.             Core Components/Risk Factors/Patient Goals Review:   Goals and Risk Factor Review     Row Name 06/15/23 1156 06/30/23 1302 07/23/23 1437 08/20/23 1313       Core Components/Risk Factors/Patient Goals Review   Personal Goals Review Weight  Management/Obesity;Lipids;Hypertension;Stress Weight Management/Obesity;Lipids;Hypertension;Stress Weight Management/Obesity;Lipids;Hypertension;Stress Weight Management/Obesity;Lipids;Hypertension;Stress    Review [name] started cardiac rehab on 06/15/23. Skyleen did well with exercise. Vital signs were stable. Anivea is off to a good start to exercise. Vital signs have been  stable. Naomie has gained 2.3 kg since starting cardiac rehab. Christell  continues to do well with  exercise at cardiac rehab . Vital signs have been stable. Jame  continues to do well with  exercise at cardiac rehab . Vital signs remain stable. Yenifer will complete cardiac rehab on 09/02/23.    Expected Outcomes Marisela will continue to participate in cardiac rehab for exercise, nutrtion and lifestyle modifications Denny will continue to participate in cardiac rehab for exercise, nutrtion and lifestyle modifications Janei will continue to participate in cardiac rehab for exercise, nutrtion and lifestyle modifications Sonji will continue to participate in cardiac rehab for exercise, nutrtion and lifestyle modifications             Core Components/Risk Factors/Patient Goals at Discharge (Final Review):   Goals and Risk Factor Review - 08/20/23 1313       Core Components/Risk Factors/Patient Goals Review   Personal Goals Review Weight Management/Obesity;Lipids;Hypertension;Stress    Review Michael  continues to do well with  exercise at cardiac rehab . Vital signs remain stable. Tannie will complete cardiac rehab on 09/02/23.    Expected Outcomes Emmaleah will continue to participate in cardiac rehab for exercise, nutrtion and lifestyle modifications             ITP Comments:  ITP Comments     Row Name 06/10/23 1106 06/15/23 1154 06/30/23 1255 07/23/23 1432 08/20/23 1310   ITP Comments Dr. Armanda Magic medical director. Introduction to pritikin education/intensive cardiac rehab. Initial orientation packet reviewed with patient. 30  Day ITP Review. Idora started cardiac rehab on 06/15/23. Lasheka did well with exercise. 30 Day ITP Review. Lashea is off to a good start to exercise at cardiac rehab.. 30 Day ITP Review. Merry has good attendance and participation with exercise at  cardiac rehab.. 30 Day ITP Review. Shevette continues to have good attendance and participation with exercise at  cardiac rehab. Avannah will complete cardiac rehab on 09/02/23            Comments: See ITP Comments.

## 2023-08-24 NOTE — Telephone Encounter (Signed)
-----   Message from Little Ishikawa sent at 08/20/2023 11:31 AM EST ----- Regarding: RE: Increase exercise target heart rate range Yes that sounds good Thanks, [name] ----- Message ----- From: Artist Pais Sent: 08/19/2023   4:37 PM EST To: Little Ishikawa, MD Subject: Increase exercise target heart rate range      Greetings Dr. Bjorn Pippin,  [name] has been in cardiac rehab approximately 10 weeks and is doing well.  Blood pressure is within normal limits, but exercise heart rates are beginning to exceed Target Heart Rate Range(THRR) of 61-122 bpm (40%-80% of age predicted max HR).  If no GXT is planned for the near future and MD agrees, request to increase THR to 40% -90% of age predicted max HR 61-138 bpm.   Artist Pais, MS, ACSM CEP

## 2023-08-26 ENCOUNTER — Encounter (HOSPITAL_COMMUNITY): Payer: Medicare Other

## 2023-08-26 ENCOUNTER — Telehealth (HOSPITAL_COMMUNITY): Payer: Self-pay | Admitting: *Deleted

## 2023-08-26 NOTE — Telephone Encounter (Signed)
 Pt left message on departmental voicemail.  Pt not feeling well with temperature and cold like symptoms.  Called and advised pt of our sick policy as it relates to temperatures.  Must be fever-free for 48 hours without the use of medications.  Further advised if pt continued with the temperature and symptoms worsen consider testing for respiratory illness such as covid, flu, rsv.  Verbalized understanding.  Hope to mend over the weekend and return on Monday. Saturnino Bernett PEAK, BSN Cardiac and Emergency Planning/management Officer

## 2023-08-28 ENCOUNTER — Encounter (HOSPITAL_COMMUNITY): Payer: Medicare Other

## 2023-08-28 DIAGNOSIS — R051 Acute cough: Secondary | ICD-10-CM | POA: Diagnosis not present

## 2023-08-28 DIAGNOSIS — B349 Viral infection, unspecified: Secondary | ICD-10-CM | POA: Diagnosis not present

## 2023-08-28 DIAGNOSIS — J101 Influenza due to other identified influenza virus with other respiratory manifestations: Secondary | ICD-10-CM | POA: Diagnosis not present

## 2023-08-31 ENCOUNTER — Encounter (HOSPITAL_COMMUNITY): Payer: Medicare Other

## 2023-09-01 ENCOUNTER — Encounter: Payer: Self-pay | Admitting: Pharmacist Clinician (PhC)/ Clinical Pharmacy Specialist

## 2023-09-01 ENCOUNTER — Ambulatory Visit
Payer: Medicare Other | Attending: Cardiovascular Disease | Admitting: Pharmacist Clinician (PhC)/ Clinical Pharmacy Specialist

## 2023-09-01 VITALS — BP 126/79 | HR 80

## 2023-09-01 DIAGNOSIS — I5042 Chronic combined systolic (congestive) and diastolic (congestive) heart failure: Secondary | ICD-10-CM | POA: Diagnosis not present

## 2023-09-01 DIAGNOSIS — I5022 Chronic systolic (congestive) heart failure: Secondary | ICD-10-CM | POA: Insufficient documentation

## 2023-09-01 MED ORDER — SPIRONOLACTONE 25 MG PO TABS: 12.5000 mg | ORAL_TABLET | Freq: Every day | ORAL | 3 refills | Status: AC

## 2023-09-01 NOTE — Progress Notes (Signed)
Remote pacemaker transmission.

## 2023-09-01 NOTE — Assessment & Plan Note (Signed)
Assessment: BP in office today is 126/79 Patient with HFmrEF Tolerates current medications without any side effects Denies SOB, palpitation, chest pain, headaches,or edema No concerns with regards to cost of medications, compliance, ability to pick up at pharmacy Reiterated the importance of regular exercise and low salt diet  Plan: GDMT ACEI/ARB/ARNI Losartan 25 mg qd continue  Beta blocker Metoprolol succ 25 mg qd continue  MRA  Start spironolactone 12.5 mg qd  SGLT2 Empagliflozin 10 mg qd continue   Labs orderd:  BMET 10-14 days Follow up with MD in 6 weeks

## 2023-09-01 NOTE — Patient Instructions (Signed)
Follow up appointment: with Dr. Bjorn Pippin in April  Go to the lab in 10-14 days to check potassium level  Take your meds as follows:  Start spironolactone 12.5 mg (1/2 of 25 mg tablet) once daily  Continue with all other medications  Check your blood pressure at home daily and keep record of the readings.  To check your pressure at home you will need to:  1. Sit up in a chair, with feet flat on the floor and back supported. Do not cross your ankles or legs. 2. Rest your left arm so that the cuff is about heart level. If the cuff goes on your upper arm,  then just relax the arm on the table, arm of the chair or your lap. If you have a wrist cuff, we  suggest relaxing your wrist against your chest (think of it as Pledging the Flag with the  wrong arm).  3. Place the cuff snugly around your arm, about 1 inch above the crook of your elbow. The  cords should be inside the groove of your elbow.  4. Sit quietly, with the cuff in place, for about 5 minutes. After that 5 minutes press the power  button to start a reading. 5. Do not talk or move while the reading is taking place.  6. Record your readings on a sheet of paper. Although most cuffs have a memory, it is often  easier to see a pattern developing when the numbers are all in front of you.  7. You can repeat the reading after 1-3 minutes if it is recommended  Make sure your bladder is empty and you have not had caffeine or tobacco within the last 30 min  Always bring your blood pressure log with you to your appointments. If you have not brought your monitor in to be double checked for accuracy, please bring it to your next appointment.  You can find a list of quality blood pressure cuffs at [email or web address]  Important lifestyle changes to control high blood pressure  Intervention  Effect on the BP  Lose extra pounds and watch your waistline Weight loss is one of the most effective lifestyle changes for controlling blood pressure. If  you're overweight or obese, losing even a small amount of weight can help reduce blood pressure. Blood pressure might go down by about 1 millimeter of mercury (mm Hg) with each kilogram (about 2.2 pounds) of weight lost.  Exercise regularly As a general goal, aim for at least 30 minutes of moderate physical activity every day. Regular physical activity can lower high blood pressure by about 5 to 8 mm Hg.  Eat a healthy diet Eating a diet rich in whole grains, fruits, vegetables, and low-fat dairy products and low in saturated fat and cholesterol. A healthy diet can lower high blood pressure by up to 11 mm Hg.  Reduce salt (sodium) in your diet Even a small reduction of sodium in the diet can improve heart health and reduce high blood pressure by about 5 to 6 mm Hg.  Limit alcohol One drink equals 12 ounces of beer, 5 ounces of wine, or 1.5 ounces of 80-proof liquor.  Limiting alcohol to less than one drink a day for women or two drinks a day for men can help lower blood pressure by about 4 mm Hg.   If you have any questions or concerns please use My Chart to send questions or call the office at [phone]

## 2023-09-01 NOTE — Progress Notes (Signed)
Office Visit    Patient Name: [name] Date of Encounter: 09/01/2023  Primary Care Provider:  Aliene Beams, MD Primary Cardiologist:  Little Ishikawa, MD  Chief Complaint    Heart Failure Medication Titration - EF 40-45% (by echo 05/2023)  Significant Past Medical History   CAD 9/24 90% mLAD stenosis  HLD 10/24 LDL 15 on rosuvastatin 10  preDM 10/24 A1c 5.7  2nd degree AV block Has PPM  7/24  AVR Replaced 10/24,     Allergies  Allergen Reactions   Wound Dressing Adhesive     Blisters from bandages    History of Present Illness    [name] is a 68 y.o. female patient of Dr Bjorn Pippin, in the office today for heart failure medication titration.  [name] had a pacemaker placed in July of 24, then had aortic valve replacement in October.  She has been doing well since then, attending cardiac rehab regularly, although missed the last few sessions because of flu.  Currently she is on losartan, metoprolol and empagliflozin for HF management, in the office today to determine if can add in spironolactone.    Blood Pressure Goal:  130/80  GDMT: ACEI/ARB/ARNI [x] Yes [] No Losartan 25 mg every day   Beta blocker [x] Yes [] No Metoprolol succ 25 mg every day   MRA [] Yes [x] No   SGLT2 inhibitor [x] Yes [] No Empagliflozin 10 mg every day    Family Hx:  mother and both brothers with HF, CKD, DM; no heart history in father, died cancer at 68  Social Hx:      Tobacco: no  Alcohol: wine few times per week  Caffeine: occasional diet Coke  Diet:  mix of home cooked, when eating out opts for salads or sandwiches; proteins are mostly chicken and fish, but does eat occasional beef;  vegetables are fresh or frozen, snacks are mostly fruits.    Exercise: cardiac rehab three times per week  Home BP readings:  seems to be WNL or slightly elevated   Accessory Clinical Findings    Lab Results  Component Value Date   CREATININE 1.06 (H) 08/05/2023   BUN 18  08/05/2023   NA 136 08/05/2023   K 4.7 08/05/2023   CL 103 08/05/2023   CO2 20 08/05/2023   Lab Results  Component Value Date   ALT 22 07/06/2023   AST 23 07/06/2023   ALKPHOS 68 07/06/2023   BILITOT 0.7 07/06/2023   Lab Results  Component Value Date   HGBA1C 5.7 (H) 05/04/2023    Home Medications/Allergies    Current Outpatient Medications  Medication Sig Dispense Refill   spironolactone (ALDACTONE) 25 MG tablet Take 0.5 tablets (12.5 mg total) by mouth daily. 45 tablet 3   acetaminophen (TYLENOL) 650 MG CR tablet Take 650 mg by mouth every 8 (eight) hours as needed for pain.     aspirin EC 81 MG tablet Take 1 tablet (81 mg total) by mouth daily. Swallow whole. 150 tablet 2   Calcium Carbonate (CALCI-CHEW PO) Take 1 tablet by mouth in the morning.     calcium carbonate (TUMS EX) 750 MG chewable tablet Chew 2 tablets by mouth daily as needed for heartburn.     cholecalciferol (VITAMIN D3) 25 MCG (1000 UNIT) tablet Take 1,000 Units by mouth daily.     empagliflozin (JARDIANCE) 10 MG TABS tablet Take 1 tablet (10 mg total) by mouth daily before breakfast. 90 tablet 3   escitalopram (LEXAPRO) 10 MG tablet Take 10  mg by mouth daily.     ferrous sulfate 325 (65 FE) MG EC tablet Take 1 tablet (325 mg total) by mouth daily with breakfast. For one month then stop. If develops constipation, may take laxative or stop iron.     losartan (COZAAR) 25 MG tablet Take 1 tablet (25 mg total) by mouth daily. 90 tablet 3   metoprolol succinate (TOPROL XL) 25 MG 24 hr tablet Take 1 tablet (25 mg total) by mouth daily. 90 tablet 3   Polyethyl Glycol-Propyl Glycol (LUBRICATING EYE DROPS OP) Place 1 drop into both eyes daily as needed (dry eyes).     rosuvastatin (CRESTOR) 10 MG tablet Take 10 mg by mouth daily.     traMADol (ULTRAM) 50 MG tablet Take 1 tablet (50 mg total) by mouth every 6 (six) hours as needed for moderate pain (pain score 4-6). (Patient not taking: Reported on 07/27/2023) 28 tablet 0    No current facility-administered medications for this visit.     Allergies  Allergen Reactions   Wound Dressing Adhesive     Blisters from bandages       Assessment & Plan      Chronic heart failure with mildly reduced ejection fraction (HFmrEF, 41-49%) (HCC) Assessment: BP in office today is 126/79 Patient with HFmrEF Tolerates current medications without any side effects Denies SOB, palpitation, chest pain, headaches,or edema No concerns with regards to cost of medications, compliance, ability to pick up at pharmacy Reiterated the importance of regular exercise and low salt diet  Plan: GDMT ACEI/ARB/ARNI Losartan 25 mg qd continue  Beta blocker Metoprolol succ 25 mg qd continue  MRA  Start spironolactone 12.5 mg qd  SGLT2 Empagliflozin 10 mg qd continue   Labs orderd:  BMET 10-14 days Follow up with MD in 6 weeks   Phillips Hay PharmD CPP [facility]  [address] [place], [place] [address] [phone]

## 2023-09-01 NOTE — Addendum Note (Signed)
Addended by: Geralyn Flash D on: 09/01/2023 10:04 AM   Modules accepted: Orders

## 2023-09-02 ENCOUNTER — Encounter (HOSPITAL_COMMUNITY): Admission: RE | Admit: 2023-09-02 | Payer: Medicare Other | Source: Ambulatory Visit

## 2023-09-02 ENCOUNTER — Encounter (HOSPITAL_COMMUNITY): Payer: Medicare Other

## 2023-09-04 ENCOUNTER — Encounter (HOSPITAL_COMMUNITY): Payer: Medicare Other

## 2023-09-07 ENCOUNTER — Encounter (HOSPITAL_COMMUNITY)
Admission: RE | Admit: 2023-09-07 | Discharge: 2023-09-07 | Disposition: A | Discharge status: Home / Self Care | Payer: Medicare Other | Source: Ambulatory Visit | Attending: Cardiology | Admitting: Cardiology

## 2023-09-07 DIAGNOSIS — Z952 Presence of prosthetic heart valve: Secondary | ICD-10-CM

## 2023-09-07 DIAGNOSIS — Z951 Presence of aortocoronary bypass graft: Secondary | ICD-10-CM | POA: Diagnosis not present

## 2023-09-07 NOTE — Progress Notes (Signed)
 Discharge Progress Report  Patient Details  Name: [name] MRN: [id number] Date of Birth: 19-May-1956 Referring Provider:   Flowsheet Row INTENSIVE CARDIAC REHAB ORIENT from 06/10/2023 in [facility]  Referring Provider Epifanio Lesches, MD        Number of Visits: 63  Reason for Discharge:  Patient reached a stable level of exercise. Patient independent in their exercise. Patient has met program and personal goals.  Smoking History:  Social History   Tobacco Use  Smoking Status Never  Smokeless Tobacco Never    Diagnosis:  05/06/23 S/P CABG x 1  05/06/23 S/P aortic valve replacement  ADL [facility]:   Initial Exercise Prescription:  Initial Exercise Prescription - 06/10/23 1200       Date of Initial Exercise RX and Referring Provider   Date 06/10/23    Referring Provider Epifanio Lesches, MD    Expected Discharge Date 09/02/23      Treadmill   MPH 2    Grade 0    Minutes 15    METs 2.5      Recumbant Bike   Level 1    RPM 60    Watts 18    Minutes 15    METs 2.5      Prescription Details   Frequency (times per week) 3    Duration Progress to 30 minutes of continuous aerobic without signs/symptoms of physical distress      Intensity   THRR 40-80% of Max Heartrate 61-122    Ratings of Perceived Exertion 11-13    Perceived Dyspnea 0-4      Progression   Progression Continue progressive overload as per policy without signs/symptoms or physical distress.      Resistance Training   Training Prescription Yes    Weight 2    Reps 10-15             Discharge Exercise Prescription (Final Exercise Prescription Changes):  Exercise Prescription Changes - 09/09/23 1100       Response to Exercise   Blood Pressure (Admit) 120/78    Blood Pressure (Exit) 122/70    Heart Rate (Admit) 81 bpm    Heart Rate (Exercise) 121 bpm    Heart Rate (Exit) 88 bpm    Rating of Perceived  Exertion (Exercise) 11    Symptoms None    Comments Pt last day    Duration Continue with 30 min of aerobic exercise without signs/symptoms of physical distress.    Intensity THRR unchanged      Progression   Progression Continue to progress workloads to maintain intensity without signs/symptoms of physical distress.    Average METs 3.9      Resistance Training   Training Prescription No    Weight Relaxation day, no weights.      Treadmill   MPH 3.5    Grade 0    Minutes 15    METs 3.68      Recumbant Bike   Level 4    RPM 58    Watts 52    Minutes 15    METs 4.2      Home Exercise Plan   Plans to continue exercise at [employer] (comment)    Frequency Add 2 additional days to program exercise sessions.    Initial Home Exercises Provided 06/29/23             Functional Capacity:  6 Minute Walk     Row  Name 06/10/23 1208 09/07/23 1051       6 Minute Walk   Phase Initial Discharge    Distance 1310 feet 1554 feet    Distance % Change -- 18.63 %    Distance Feet Change -- 244 ft    Walk Time 6 minutes 6 minutes    # of Rest Breaks 0 0    MPH 2.48 2.94    METS 2.93 3.34    RPE 10 7    Perceived Dyspnea  0 0    VO2 Peak 10.26 11.7    Symptoms No No    Resting HR 80 bpm 84 bpm    Resting BP 108/66 122/72    Resting Oxygen Saturation  94 % --    Exercise Oxygen Saturation  during 6 min walk 95 % --    Max Ex. HR 101 bpm 114 bpm    Max Ex. BP 122/68 106/70    2 Minute Post BP 112/70 102/62             Psychological, QOL, Others - Outcomes: PHQ 2/9:    09/07/2023   11:52 AM 06/10/2023   11:18 AM 01/30/2014    3:11 PM  Depression screen PHQ 2/9  Decreased Interest 0 0   Down, Depressed, Hopeless 0 0 0  PHQ - 2 Score 0 0 0  Altered sleeping 0 2   Tired, decreased energy 1 0   Change in appetite 0 0   Feeling bad or failure about yourself  0 0   Trouble concentrating 0 0   Moving slowly or fidgety/restless 0 0   Suicidal thoughts 0 0    PHQ-9 Score 1 2   Difficult doing work/chores Not difficult at all Not difficult at all     Quality of Life:  Quality of Life - 09/08/23 0751       Quality of Life   Select Quality of Life      Quality of Life Scores   Health/Function Pre 25 %    Health/Function Post 26.43 %    Health/Function % Change 5.72 %    Socioeconomic Pre 30 %    Socioeconomic Post 28.21 %    Socioeconomic % Change  -5.97 %    Psych/Spiritual Pre 27.5 %    Psych/Spiritual Post 28.93 %    Psych/Spiritual % Change 5.2 %    Family Pre 27 %    Family Post 26.5 %    Family % Change -1.85 %    GLOBAL Pre 26.79 %    GLOBAL Post 27.32 %    GLOBAL % Change 1.98 %             Personal Goals: Goals established at orientation with interventions provided to work toward goal.  Personal Goals and Risk Factors at Admission - 06/10/23 1115       Core Components/Risk Factors/Patient Goals on Admission    Weight Management Yes;Weight Loss    Intervention Weight Management: Develop a combined nutrition and exercise program designed to reach desired caloric intake, while maintaining appropriate intake of nutrient and fiber, sodium and fats, and appropriate energy expenditure required for the weight goal.;Weight Management: Provide education and appropriate resources to help participant work on and attain dietary goals.    Expected Outcomes Long Term: Adherence to nutrition and physical activity/exercise program aimed toward attainment of established weight goal;Short Term: Continue to assess and modify interventions until short term weight is achieved;Weight Loss: Understanding of general recommendations for  a balanced deficit meal plan, which promotes 1-2 lb weight loss per week and includes a negative energy balance of [phone] kcal/d;Understanding recommendations for meals to include 15-35% energy as protein, 25-35% energy from fat, 35-60% energy from carbohydrates, less than 200mg  of dietary cholesterol, 20-35 gm  of total fiber daily;Understanding of distribution of calorie intake throughout the day with the consumption of 4-5 meals/snacks    Hypertension Yes    Intervention Provide education on lifestyle modifcations including regular physical activity/exercise, weight management, moderate sodium restriction and increased consumption of fresh fruit, vegetables, and low fat dairy, alcohol moderation, and smoking cessation.;Monitor prescription use compliance.    Expected Outcomes Short Term: Continued assessment and intervention until BP is < 140/37mm HG in hypertensive participants. < 130/38mm HG in hypertensive participants with diabetes, heart failure or chronic kidney disease.;Long Term: Maintenance of blood pressure at goal levels.    Lipids Yes    Intervention Provide education and support for participant on nutrition & aerobic/resistive exercise along with prescribed medications to achieve LDL 70mg , HDL >40mg .    Expected Outcomes Short Term: Participant states understanding of desired cholesterol values and is compliant with medications prescribed. Participant is following exercise prescription and nutrition guidelines.;Long Term: Cholesterol controlled with medications as prescribed, with individualized exercise RX and with personalized nutrition plan. Value goals: LDL < 70mg , HDL > 40 mg.    Stress Yes    Intervention Offer individual and/or small group education and counseling on adjustment to heart disease, stress management and health-related lifestyle change. Teach and support self-help strategies.;Refer participants experiencing significant psychosocial distress to appropriate mental health specialists for further evaluation and treatment. When possible, include family members and significant others in education/counseling sessions.    Expected Outcomes Short Term: Participant demonstrates changes in health-related behavior, relaxation and other stress management skills, ability to obtain effective  social support, and compliance with psychotropic medications if prescribed.;Long Term: Emotional wellbeing is indicated by absence of clinically significant psychosocial distress or social isolation.              Personal Goals Discharge:  Goals and Risk Factor Review     Row Name 06/15/23 1156 06/30/23 1302 07/23/23 1437 08/20/23 1313       Core Components/Risk Factors/Patient Goals Review   Personal Goals Review Weight Management/Obesity;Lipids;Hypertension;Stress Weight Management/Obesity;Lipids;Hypertension;Stress Weight Management/Obesity;Lipids;Hypertension;Stress Weight Management/Obesity;Lipids;Hypertension;Stress    Review Clary started cardiac rehab on 06/15/23. Coila did well with exercise. Vital signs were stable. Vinnie is off to a good start to exercise. Vital signs have been  stable. Miryah has gained 2.3 kg since starting cardiac rehab. Corianne  continues to do well with  exercise at cardiac rehab . Vital signs have been stable. Arnetha  continues to do well with  exercise at cardiac rehab . Vital signs remain stable. Doha will complete cardiac rehab on 09/02/23.    Expected Outcomes Mariachristina will continue to participate in cardiac rehab for exercise, nutrtion and lifestyle modifications Tamyah will continue to participate in cardiac rehab for exercise, nutrtion and lifestyle modifications Valborg will continue to participate in cardiac rehab for exercise, nutrtion and lifestyle modifications Skarlet will continue to participate in cardiac rehab for exercise, nutrtion and lifestyle modifications             Exercise Goals and Review:  Exercise Goals     Row Name 06/10/23 1109             Exercise Goals   Increase Physical Activity Yes       Intervention  Provide advice, education, support and counseling about physical activity/exercise needs.;Develop an individualized exercise prescription for aerobic and resistive training based on initial evaluation findings, risk  stratification, comorbidities and participant's personal goals.       Expected Outcomes Short Term: Attend rehab on a regular basis to increase amount of physical activity.;Long Term: Add in home exercise to make exercise part of routine and to increase amount of physical activity.;Long Term: Exercising regularly at least 3-5 days a week.       Increase Strength and Stamina Yes       Intervention Provide advice, education, support and counseling about physical activity/exercise needs.;Develop an individualized exercise prescription for aerobic and resistive training based on initial evaluation findings, risk stratification, comorbidities and participant's personal goals.       Expected Outcomes Short Term: Perform resistance training exercises routinely during rehab and add in resistance training at home;Short Term: Increase workloads from initial exercise prescription for resistance, speed, and METs.;Long Term: Improve cardiorespiratory fitness, muscular endurance and strength as measured by increased METs and functional capacity ( )       Able to understand and use rate of perceived exertion (RPE) scale Yes       Intervention Provide education and explanation on how to use RPE scale       Expected Outcomes Short Term: Able to use RPE daily in rehab to express subjective intensity level;Long Term:  Able to use RPE to guide intensity level when exercising independently       Knowledge and understanding of Target Heart Rate Range (THRR) Yes       Intervention Provide education and explanation of THRR including how the numbers were predicted and where they are located for reference       Expected Outcomes Short Term: Able to state/look up THRR;Short Term: Able to use daily as guideline for intensity in rehab;Long Term: Able to use THRR to govern intensity when exercising independently       Understanding of Exercise Prescription Yes       Intervention Provide education, explanation, and written  materials on patient's individual exercise prescription       Expected Outcomes Short Term: Able to explain program exercise prescription;Long Term: Able to explain home exercise prescription to exercise independently                Exercise Goals Re-Evaluation:  Exercise Goals Re-Evaluation     Row Name 06/15/23 1127 06/29/23 1052 07/27/23 1043 08/19/23 1046 08/24/23 1030     Exercise Goal Re-Evaluation   Exercise Goals Review Increase Physical Activity;Increase Strength and Stamina;Able to understand and use rate of perceived exertion (RPE) scale Increase Physical Activity;Increase Strength and Stamina;Able to understand and use rate of perceived exertion (RPE) scale;Understanding of Exercise Prescription;Knowledge and understanding of Target Heart Rate Range (THRR) Increase Physical Activity;Increase Strength and Stamina;Able to understand and use rate of perceived exertion (RPE) scale;Understanding of Exercise Prescription;Knowledge and understanding of Target Heart Rate Range (THRR) Increase Physical Activity;Increase Strength and Stamina;Able to understand and use rate of perceived exertion (RPE) scale;Understanding of Exercise Prescription;Knowledge and understanding of Target Heart Rate Range (THRR) Increase Physical Activity;Increase Strength and Stamina;Able to understand and use rate of perceived exertion (RPE) scale;Understanding of Exercise Prescription;Knowledge and understanding of Target Heart Rate Range (THRR)   Comments Carlis was able to understand and use RPE scale appropriately. Reviewed exercise prescription with Ryelle. She is walking 30 minutes 2-3 days/week and plans to start riding her stationary bike. Daiya states she's been cleared  by her cardiologist to resumse water aerobics. She restart water aerobics at the gym tomorrow and plans to participate in water aerobics 60 minutes on Tuesdays and Thursdays. She also has a stationary bike at home that she plans to use for her  home exercise. Her goal was to be able to return to water aerobics. Ivanna continues to progress well with exericse. She occassionally exceed THRR at fairly light workloads. Will request a THRR increase from her cardiologist. She participates in water aerobics 60 minutes 3 days/week and plans to increase to 5 days/week upon completion of cardiac rehab. She also wants to return to hiking. Received clearance to increase target heart rate range to 40-90% of age predicted heart rate. New THRR 61-138. Reviewed THRR increase with Sandeep. Jurline wants to make up sessions missed. New graduation date is 09/09/23.   Expected Outcomes Progress workloads as tolerated to help increase cardiorespiratory fitness. Kaylenn will walk or ride her stationary bike 30 minutes 2-3 days/week to achieve 150 minutes of aerobic exercise/week. Mikiala will participate in water aerobics 60 minutes, 2 days/week and ride her stationary bike as her mode of home exercise. Meyah will continue to progress workloads and participate in water aerobics. Francelia will increase workloads as tolerated within new THRR.    Row Name 09/07/23 1136             Exercise Goal Re-Evaluation   Exercise Goals Review Increase Physical Activity;Increase Strength and Stamina;Able to understand and use rate of perceived exertion (RPE) scale;Understanding of Exercise Prescription;Knowledge and understanding of Target Heart Rate Range (THRR)       Comments Joclynn will complete the cardiac rehab program this week and has progressed well. Her functional capacity increased 19% as measured by the , and she is pleased with her participation in the program. She has returned to water aerobics, and she plans to continue that as her mode of exercise, as well as walking and riding her stationary bike.       Expected Outcomes Grethel will continue walking, riding her stationary bike, and water aerobics to maintain health and fitness gains.                Nutrition & Weight  - Outcomes:  Pre Biometrics - 06/10/23 1107       Pre Biometrics   Waist Circumference 34.5 inches    Hip Circumference 38.5 inches    Waist to Hip Ratio 0.9 %    Triceps Skinfold 35 mm    % Body Fat 40.2 %    Grip Strength 24 kg    Flexibility 14 in    Single Leg Stand 30 seconds             Post Biometrics - 09/09/23 1038        Post  Biometrics   Height 5' 3.5" (1.613 m)    Waist Circumference 34.5 inches    Hip Circumference 38.5 inches    Waist to Hip Ratio 0.9 %    BMI (Calculated) 28.02    Triceps Skinfold 32 mm    % Body Fat 39.7 %    Grip Strength 22 kg    Flexibility 13 in    Single Leg Stand 30 seconds             Nutrition:  Nutrition Therapy & Goals - 06/15/23 1151       Nutrition Therapy   Diet Heart Healthy Diet    Drug/Food Interactions Statins/Certain Fruits  Personal Nutrition Goals   Nutrition Goal Patient to identify strategies for reducing cardiovascular risk by attending the [employer] education and nutrition series weekly.    Personal Goal #2 Patient to improve diet quality by using the plate method as a guide for meal planning to include lean protein/plant protein, fruits, vegetables, whole grains, nonfat dairy as part of a well-balanced diet.    Comments Hollie has medical history of s/p AVR, CABG x1, breast cancer. Her lipids are well controlled at goal. Patient will benefit from participation in intensive cardiac rehab for nutrition, exercise, and lifestyle modification.      Intervention Plan   Intervention Prescribe, educate and counsel regarding individualized specific dietary modifications aiming towards targeted core components such as weight, hypertension, lipid management, diabetes, heart failure and other comorbidities.;Nutrition handout(s) given to patient.    Expected Outcomes Short Term Goal: Understand basic principles of dietary content, such as calories, fat, sodium, cholesterol and nutrients.;Long Term Goal: Adherence  to prescribed nutrition plan.             Nutrition Discharge:  Nutrition Assessments - 09/08/23 0753       Rate Your Plate Scores   Pre Score 68             Education Questionnaire Score:  Knowledge Questionnaire Score - 09/07/23 1136       Knowledge Questionnaire Score   Pre Score 22/24    Post Score 24/24             Goals reviewed with patient; copy given to patient.Pt graduates from  Intensive/Traditional cardiac rehab program on 09/07/23 with completion of  29 exercise and 20  education sessions. Pt maintained good attendance and progressed nicely during their participation in rehab as evidenced by increased MET level. Cartha increased her distance on her post exercise walk test by 244 feet.   Medication list reconciled. Repeat  PHQ score- 1 .  Pt has made significant lifestyle changes and should be commended for her success. Liseth achieved her goals during cardiac rehab.   Pt plans to continue exercise at home riding her stationary bike and participating in water aerobics. We are proud of Shaneal's progress! Thayer Headings RN BSN

## 2023-09-09 ENCOUNTER — Encounter (HOSPITAL_COMMUNITY)
Admission: RE | Admit: 2023-09-09 | Discharge: 2023-09-09 | Discharge status: Home / Self Care | Payer: Medicare Other | Source: Ambulatory Visit | Attending: Cardiology

## 2023-09-09 VITALS — BP 120/78 | HR 81 | Ht 63.5 in | Wt 160.7 lb

## 2023-09-09 DIAGNOSIS — Z952 Presence of prosthetic heart valve: Secondary | ICD-10-CM

## 2023-09-09 DIAGNOSIS — Z951 Presence of aortocoronary bypass graft: Secondary | ICD-10-CM | POA: Diagnosis not present

## 2023-09-11 ENCOUNTER — Encounter (HOSPITAL_COMMUNITY): Payer: Medicare Other

## 2023-09-14 ENCOUNTER — Encounter (HOSPITAL_COMMUNITY): Payer: Medicare Other

## 2023-09-18 DIAGNOSIS — I5042 Chronic combined systolic (congestive) and diastolic (congestive) heart failure: Secondary | ICD-10-CM | POA: Diagnosis not present

## 2023-09-19 LAB — BASIC METABOLIC PANEL
BUN/Creatinine Ratio: 14 (ref 12–28)
BUN: 14 mg/dL (ref 8–27)
CO2: 24 mmol/L (ref 20–29)
Calcium: 9.7 mg/dL (ref 8.7–10.3)
Chloride: 103 mmol/L (ref 96–106)
Creatinine, Ser: 0.99 mg/dL (ref 0.57–1.00)
Glucose: 89 mg/dL (ref 70–99)
Potassium: 4.8 mmol/L (ref 3.5–5.2)
Sodium: 141 mmol/L (ref 134–144)
eGFR: 62 mL/min/{1.73_m2} (ref >59)

## 2023-10-01 DIAGNOSIS — G5762 Lesion of plantar nerve, left lower limb: Secondary | ICD-10-CM | POA: Diagnosis not present

## 2023-10-12 DIAGNOSIS — H524 Presbyopia: Secondary | ICD-10-CM | POA: Diagnosis not present

## 2023-10-20 ENCOUNTER — Encounter (HOSPITAL_COMMUNITY): Payer: Medicare Other | Attending: Cardiology

## 2023-10-20 DIAGNOSIS — I441 Atrioventricular block, second degree: Secondary | ICD-10-CM

## 2023-10-20 DIAGNOSIS — Z951 Presence of aortocoronary bypass graft: Secondary | ICD-10-CM | POA: Insufficient documentation

## 2023-10-20 DIAGNOSIS — Z952 Presence of prosthetic heart valve: Secondary | ICD-10-CM | POA: Insufficient documentation

## 2023-10-20 LAB — CUP PACEART REMOTE DEVICE CHECK
Battery Remaining Longevity: 102 mo
Battery Voltage: 3.02 V
Brady Statistic AP VP Percent: 55.48 %
Brady Statistic AP VS Percent: 0.02 %
Brady Statistic AS VP Percent: 44.46 %
Brady Statistic AS VS Percent: 0.04 %
Brady Statistic RA Percent Paced: 55.49 %
Brady Statistic RV Percent Paced: 99.94 %
Date Time Interrogation Session: 20250331231653
Implantable Lead Connection Status: 753985
Implantable Lead Connection Status: 753985
Implantable Lead Implant Date: 20240703
Implantable Lead Implant Date: 20240703
Implantable Lead Location: 753859
Implantable Lead Location: 753860
Implantable Lead Model: 3830
Implantable Lead Model: 5076
Implantable Pulse Generator Implant Date: 20240703
Lead Channel Impedance Value: 285 Ohm
Lead Channel Impedance Value: 399 Ohm
Lead Channel Impedance Value: 418 Ohm
Lead Channel Impedance Value: 627 Ohm
Lead Channel Pacing Threshold Amplitude: 1.375 V
Lead Channel Pacing Threshold Amplitude: 1.5 V
Lead Channel Pacing Threshold Pulse Width: 0.4 ms
Lead Channel Pacing Threshold Pulse Width: 0.4 ms
Lead Channel Sensing Intrinsic Amplitude: 1.875 mV
Lead Channel Sensing Intrinsic Amplitude: 1.875 mV
Lead Channel Sensing Intrinsic Amplitude: 18.125 mV
Lead Channel Sensing Intrinsic Amplitude: 18.125 mV
Lead Channel Setting Pacing Amplitude: 3 V
Lead Channel Setting Pacing Amplitude: 3 V
Lead Channel Setting Pacing Pulse Width: 0.4 ms
Lead Channel Setting Sensing Sensitivity: 1.2 mV
Zone Setting Status: 755011

## 2023-10-21 ENCOUNTER — Encounter: Payer: Self-pay | Admitting: Internal Medicine

## 2023-10-21 DIAGNOSIS — D229 Melanocytic nevi, unspecified: Secondary | ICD-10-CM | POA: Diagnosis not present

## 2023-10-22 ENCOUNTER — Ambulatory Visit: Attending: Cardiology

## 2023-10-22 DIAGNOSIS — I441 Atrioventricular block, second degree: Secondary | ICD-10-CM

## 2023-10-22 NOTE — Progress Notes (Signed)
 Pt seen in device clinic d/t concerns of early depletion of her battery.    Per review of Pt's atrial/ventricular capture threshold history, it appears Pt's outputs have been trending up since October of 2024.   Her outputs were programmed to adaptive, and upon arrival today  her atrial and ventricular outputs were 3.25V at 0.4 ms with a battery life of 7.5 years.  Pt underlying rhythm AS 80/VS 40 with 2:1 heart block.  Tested Pt's atrial and ventricular lead.  Impedance and sensing WNL.  Both A and V capture thresholds were 1.5V at 0.74ms.  Tested Pt's RV capture at varying pulse width's with the following results: 1.25V at 0.6 ms 1V at 0.8 ms 1V at 1 ms.  Retested capture with pulse width-with loss of capture at 2V at 0.37ms.    Reprogrammed RV output to 2.5V at 0.58ms on monitor.    Reprogrammed atrial output to 2.5V at 0.81ms on monitor.  Reduced LRL to 70 bpm.  With changes made above, Pt's battery life increased to 8.3 years.   Pt has follow up scheduled in July 2025.  Would reevaluate to see if RV capture threshold has stabilized.  If it has, could potentially lower RV output to 2V.

## 2023-10-22 NOTE — Patient Instructions (Signed)
 Follow up as scheduled.

## 2023-10-26 LAB — CUP PACEART INCLINIC DEVICE CHECK
Battery Remaining Longevity: 99 mo
Battery Voltage: 3.02 V
Brady Statistic AP VP Percent: 57.67 %
Brady Statistic AP VS Percent: 0.01 %
Brady Statistic AS VP Percent: 42.27 %
Brady Statistic AS VS Percent: 0.04 %
Brady Statistic RA Percent Paced: 57.67 %
Brady Statistic RV Percent Paced: 99.95 %
Date Time Interrogation Session: 20250403140800
Implantable Lead Connection Status: 753985
Implantable Lead Connection Status: 753985
Implantable Lead Implant Date: 20240703
Implantable Lead Implant Date: 20240703
Implantable Lead Location: 753859
Implantable Lead Location: 753860
Implantable Lead Model: 3830
Implantable Lead Model: 5076
Implantable Pulse Generator Implant Date: 20240703
Lead Channel Impedance Value: 285 Ohm
Lead Channel Impedance Value: 418 Ohm
Lead Channel Impedance Value: 456 Ohm
Lead Channel Impedance Value: 627 Ohm
Lead Channel Pacing Threshold Amplitude: 1.375 V
Lead Channel Pacing Threshold Amplitude: 1.625 V
Lead Channel Pacing Threshold Pulse Width: 0.4 ms
Lead Channel Pacing Threshold Pulse Width: 0.4 ms
Lead Channel Sensing Intrinsic Amplitude: 1.25 mV
Lead Channel Sensing Intrinsic Amplitude: 1.5 mV
Lead Channel Sensing Intrinsic Amplitude: 10 mV
Lead Channel Sensing Intrinsic Amplitude: 18.125 mV
Lead Channel Setting Pacing Amplitude: 2.5 V
Lead Channel Setting Pacing Amplitude: 2.5 V
Lead Channel Setting Pacing Pulse Width: 0.8 ms
Lead Channel Setting Sensing Sensitivity: 1.2 mV
Zone Setting Status: 755011

## 2023-10-29 ENCOUNTER — Encounter: Payer: Self-pay | Admitting: Cardiology

## 2023-10-29 ENCOUNTER — Ambulatory Visit: Payer: Medicare Other | Attending: Cardiology | Admitting: Cardiology

## 2023-10-29 VITALS — BP 100/70 | HR 97 | Ht 63.5 in | Wt 160.0 lb

## 2023-10-29 DIAGNOSIS — I2510 Atherosclerotic heart disease of native coronary artery without angina pectoris: Secondary | ICD-10-CM | POA: Diagnosis not present

## 2023-10-29 DIAGNOSIS — E785 Hyperlipidemia, unspecified: Secondary | ICD-10-CM | POA: Diagnosis not present

## 2023-10-29 DIAGNOSIS — I5042 Chronic combined systolic (congestive) and diastolic (congestive) heart failure: Secondary | ICD-10-CM

## 2023-10-29 DIAGNOSIS — I10 Essential (primary) hypertension: Secondary | ICD-10-CM

## 2023-10-29 DIAGNOSIS — Z952 Presence of prosthetic heart valve: Secondary | ICD-10-CM

## 2023-10-29 NOTE — Progress Notes (Signed)
 Cardiology Office Note:    Date:  10/29/2023   ID:  Tasha, Cox [birth year], MRN [id number]  PCP:  Aliene Beams, MD  Cardiologist:  Little Ishikawa, MD  Electrophysiologist:  None   Referring MD: Aliene Beams, MD   Chief Complaint  Patient presents with   Congestive Heart Failure     History of Present Illness:    [name] is a 68 y.o. female with a hx of CAD, chronic combined heart failure, complete heart block status post PPM, aortic stenosis status post AVR, breast cancer, hyperlipidemia who presents for follow-up.  She was referred by Dr. Tracie Harrier for evaluation of heart murmur, initially seen on 10/24/2019.  She reports that she followed at [facility] as a child, was told she had an issue with her aortic valve.   TTE 02/05/2007 showed LVEF 55%, mild LV dilatation, mild MR, mild AI.  TTE on 11/14/2019 showed LVEF 65 to 70%, indeterminate diastolic filling, normal RV systolic function, mild aortic stenosis, mild aortic regurgitation, mild mitral regurgitation.  Echocardiogram on 01/16/2021 showed EF 40 to 45%, mild RV dysfunction, moderate to severe aortic stenosis (V-max 3.0 m/s, mean gradient 20 mmHg, AVA 0.8 cm, DI 0.24). LHC/RHC on 02/13/21 showed no evidence of CAD, moderate to severe aortic stenosis (mean gradient 27 mmHg, AVA 1.1 cm), RA 1, RV 19/0, PA 20/8/12, PW CP 8, LVEDP 8, CI 3.2.  CTA shows tricuspid aortic valve with partial fusion of RCC/LCC, possibly a forme fruste bicuspid aortic valve.  Echocardiogram on 07/16/2021 showed EF had improved to 65 to 70% and aortic stenosis was mild to moderate (V-max 2.8 m/s, mean gradient 18 mmHg, AVA 1.3 cm).  Echocardiogram 06/2022 showed an EF 60 to 65%, normal RV function, moderate to severe aortic stenosis (Vmax 3.0 m/s, MG 19 mmHg, AVA 0.8 cm^2, DI 0.27).  Echocardiogram 10/2022 showed severe aortic stenosis (mean gradient 26 mmHg, AVA 0.8 cm, DI 0.25), EF 55 to 60%, normal RV function.  She was found to have  heart block and underwent Zio patch x 3 days which showed 2030 episodes of AV block (second-degree Mobitz 2, high-grade and third-degree). Had third-degree AV block with heart rate down to 36 bpm.  Underwent PPM placement with Dr. Graciela Husbands on 01/21/2023.  LHC 04/14/2023 showed severe mid LAD stenosis.  On 05/06/2023 she underwent aortic valve replacement with 21 mm Inspiris pericardial valve and CABG x 1 with LIMA-LAD.  Echocardiogram 06/17/2023 shows EF 40 to 45%, moderate LV dilatation, normal RV function, mild to moderate mitral regurgitation, severe tricuspid regurgitation,, normal functioning prosthetic aortic valve.  Since last clinic visit, she reports she is doing okay.  Reports occasional lightheadedness with standing, denies any syncope.  Denies any chest pain, dyspnea,  lower extremity edema, or palpitations. Continues to do water aerobics.   BP Readings from Last 3 Encounters:  10/29/23 100/70  09/09/23 120/78  09/01/23 126/79     Past Medical History:  Diagnosis Date   Anxiety    Aortic valve stenosis    Arthritis    Breast cancer (HCC) 2004   right breast; ER/PR+, Her2+   Cancer (HCC) 10/2002   right breast lumpectomy   Coronary artery disease    Dysrhythmia    2nd Degree AV block   GERD (gastroesophageal reflux disease)    Heart murmur    History of kidney stones    HX   HTN (hypertension)    Hyperlipidemia    Morton neuroma    Personal history  of chemotherapy 11/19/2002   Personal history of radiation therapy 02/19/2003   Pneumonia    Pre-diabetes    Presence of permanent cardiac pacemaker 01/21/2023   Medtronic    Past Surgical History:  Procedure Laterality Date   AORTIC VALVE REPLACEMENT N/A 05/06/2023   Procedure: AORTIC VALVE REPLACEMENT (AVR) USING 21 MM INSPIRIS RESILIA AORTIC VALVE;  Surgeon: Eugenio Hoes, MD;  Location: [facility] OR;  Service: Open Heart Surgery;  Laterality: N/A;   BREAST BIOPSY Right 2019   BREAST LUMPECTOMY Right    BREAST SURGERY   10/2002   lumpectomy / right side   COLONOSCOPY  2016   CORONARY ARTERY BYPASS GRAFT N/A 05/06/2023   Procedure: CORONARY ARTERY BYPASS GRAFTING (CABG) X ONE USING LEFT INTERNAL MAMMARY ARTERY;  Surgeon: Eugenio Hoes, MD;  Location: [facility] OR;  Service: Open Heart Surgery;  Laterality: N/A;   PACEMAKER IMPLANT N/A 01/21/2023   Procedure: PACEMAKER IMPLANT;  Surgeon: Duke Salvia, MD;  Location: [facility] INVASIVE CV LAB;  Service: Cardiovascular;  Laterality: N/A;   RIGHT/LEFT HEART CATH AND CORONARY ANGIOGRAPHY N/A 02/13/2021   Procedure: RIGHT/LEFT HEART CATH AND CORONARY ANGIOGRAPHY;  Surgeon: Kathleene Hazel, MD;  Location: [facility] INVASIVE CV LAB;  Service: Cardiovascular;  Laterality: N/A;   RIGHT/LEFT HEART CATH AND CORONARY ANGIOGRAPHY N/A 04/14/2023   Procedure: RIGHT/LEFT HEART CATH AND CORONARY ANGIOGRAPHY;  Surgeon: [place]az[place], Peter M, MD;  Location: [facility] INVASIVE CV LAB;  Service: Cardiovascular;  Laterality: N/A;   TEE WITHOUT CARDIOVERSION N/A 05/06/2023   Procedure: TRANSESOPHAGEAL ECHOCARDIOGRAM;  Surgeon: Eugenio Hoes, MD;  Location: [facility] OR;  Service: Open Heart Surgery;  Laterality: N/A;    Current Medications: No outpatient medications have been marked as taking for the 10/29/23 encounter (Office Visit) with Little Ishikawa, MD.     Allergies:   Wound dressing adhesive   Social History   Socioeconomic History   Marital status: Divorced    Spouse name: Not on file   Number of children: 2   Years of education: Not on file   Highest education level: Not on file  Occupational History   Occupation: Retired-Customer Financial planner  Tobacco Use   Smoking status: Never   Smokeless tobacco: Never  Vaping Use   Vaping status: Never Used  Substance and Sexual Activity   Alcohol use: Not Currently    Alcohol/week: 3.0 standard drinks of alcohol    Types: 3 Standard drinks or equivalent per week   Drug use: No   Sexual activity: Yes  Other Topics Concern   Not on file   Social History Narrative   Not on file   Social Drivers of Health   Financial Resource Strain: Not on file  Food Insecurity: Not on file  Transportation Needs: Not on file  Physical Activity: Not on file  Stress: Not on file (09/01/2019)  Social Connections: Not on file     Family History: The patient's family history includes Alzheimer's disease in her paternal aunt and paternal uncle; Bladder Cancer in her brother; Diabetes in her brother, maternal aunt, maternal uncle, and mother; Heart Problems in her maternal uncle; Heart attack in her maternal grandfather, paternal grandfather, and paternal grandmother; Heart disease in her brother, maternal aunt, mother, and paternal aunt; Kidney failure in her brother and mother; Lung cancer (age of onset: 43) in her father; Skin cancer in her father and paternal uncle. There is no history of Breast cancer.  ROS:   Please see the history of present illness.  All other  systems reviewed and are negative.  EKGs/Labs/Other Studies Reviewed:    The following studies were reviewed today:   EKG:   04/07/2023: Normal sinus rhythm, rate 77 02/13/23: A-ssened, V-paced, rate 63 11/04/2022: Normal sinus rhythm, rate 71, LVH 05/13/22: Normal sinus rhythm, rate 82, LVH, Q waves in V1-3 08/06/21: Normal sinus rhythm, rate 70, LVH, left axis deviation 07/22:  NSR, rate 69, LVH 10/24/2019: normal sinus rhythm, rate 73, Q waves in V1/2, no ST/T abnormalities  TTE 11/14/19:  1. Left ventricular ejection fraction, by estimation, is 65 to 70%. The  left ventricle has normal function. The left ventricle has no regional  wall motion abnormalities. Indeterminate diastolic filling due to E-A  fusion.   2. Right ventricular systolic function is normal. The right ventricular  size is normal. There is normal pulmonary artery systolic pressure.   3. The mitral valve is normal in structure. Mild mitral valve  regurgitation.   4. AV is thickened, calcified Peak and  mean gradients through the valve  are 29 and 17 mm Hg respectively consistent with mild aortic stenosis..  The aortic valve is tricuspid. Aortic valve regurgitation is mild. Mild  aortic valve stenosis.   5. The inferior vena cava is normal in size with greater than 50%  respiratory variability, suggesting right atrial pressure of 3 mmHg.   Recent Labs: 07/06/2023: ALT 22; Hemoglobin 11.5; Platelets 378 08/05/2023: Magnesium 2.0 09/18/2023: BUN 14; Creatinine, Ser 0.99; Potassium 4.8; Sodium 141  Recent Lipid Panel    Component Value Date/Time   CHOL 60 05/08/2023 0500   TRIG 92 05/08/2023 0500   HDL 27 (L) 05/08/2023 0500   CHOLHDL 2.2 05/08/2023 0500   VLDL 18 05/08/2023 0500   LDLCALC 15 05/08/2023 0500    Physical Exam:    VS:  BP 100/70   Pulse 97   Ht 5' 3.5" (1.613 m)   Wt 160 lb (72.6 kg)   SpO2 94%   BMI 27.90 kg/m     Wt Readings from Last 3 Encounters:  10/29/23 160 lb (72.6 kg)  09/09/23 160 lb 11.5 oz (72.9 kg)  07/27/23 160 lb 9.6 oz (72.8 kg)     GEN:  Well nourished, well developed in no acute distress HEENT: Normal NECK: No JVD CARDIAC: RRR,  2/6 systolic heart murmur loudest at the RUSB RESPIRATORY:  Clear to auscultation without rales, wheezing or rhonchi  ABDOMEN: Soft, non-tender, non-distended MUSCULOSKELETAL:  No edema; No deformity  SKIN: Warm and dry NEUROLOGIC:  Alert and oriented x 3 PSYCHIATRIC:  Normal affect   ASSESSMENT:    1. Chronic combined systolic and diastolic heart failure (HCC)   2. Coronary artery disease involving native coronary artery of native heart without angina pectoris   3. S/P AVR   4. Hyperlipidemia, unspecified hyperlipidemia type   5. Essential hypertension      PLAN:     Chronic combined systolic and diastolic heart failure:  Echocardiogram on 01/16/2021 showed EF 40 to 45%, mild RV dysfunction, moderate to severe aortic stenosis (V-max 3.0 m/s, mean gradient 20 mmHg, AVA 0.8 cm, DI 0.24).  LHC/RHC on  02/13/21 showed no evidence of CAD, moderate to severe aortic stenosis (mean gradient 27 mmHg, AVA 1.1 cm), RA 1, RV 19/0, PA 20/8/12, PW CP 8, LVEDP 8, CI 3.2.  CTA shows tricuspid aortic valve with partial fusion of RCC/LCC, possibly a forme fruste bicuspid aortic valve.  Echocardiogram on 07/16/2021 showed EF had improved to 65 to 70% and aortic stenosis was  mild to moderate (V-max 2.8 m/s, mean gradient 18 mmHg, AVA 1.3 cm).  Echocardiogram 06/17/2023 shows EF 40 to 45%, moderate LV dilatation, normal RV function, mild to moderate mitral regurgitation, severe tricuspid regurgitation, normal functioning prosthetic aortic valve. -Continue Toprol-XL 25 mg daily -Continue losartan 25 mg daily -Continue Jardiance 10 mg daily -Continue spironolactone 12.5 mg daily -Check BMET - Plan repeat echocardiogram prior to next clinic visit  CAD: LHC 04/14/2023 showed severe mid LAD stenosis.  Status post CABG with LIMA-LAD 05/06/2023.  -Continue aspirin 81 mg daily -Continue rosuvastatin 10 mg daily  Aortic stenosis s/p AVR: Echocardiogram on 01/16/2021 showed EF 40 to 45%, mild RV dysfunction, moderate to severe aortic stenosis (V-max 3.0 m/s, mean gradient 20 mmHg, AVA 0.8 cm, DI 0.24).  LHC/RHC on 02/13/21 showed no evidence of CAD, moderate to severe aortic stenosis (mean gradient 27 mmHg, AVA 1.1 cm).  CTA shows tricuspid aortic valve with partial fusion of RCC/LCC, possibly a forme fruste bicuspid aortic valve.  Echocardiogram on 07/16/2021 showed EF had improved to 65 to 70% and aortic stenosis was mild to moderate (V-max 2.8 m/s, mean gradient 18 mmHg, AVA 1.3 cm).  Echocardiogram 06/2022 showed an EF 60 to 65%, normal RV function, moderate to severe aortic stenosis (Vmax 3.0 m/s, MG 19 mmHg, AVA 0.8 cm^2, DI 0.27).  Echocardiogram 10/2022 showed severe aortic stenosis (mean gradient 26 mmHg, AVA 0.8 cm, DI 0.25), EF 55 to 60%, normal RV function.   LHC 04/14/2023 showed severe mid LAD stenosis.  On  05/06/2023 she underwent aortic valve replacement with 21 mm Inspiris pericardial valve and CABG x 1 with LIMA-LAD.  Echocardiogram 06/17/2023 shows EF 40 to 45%, moderate LV dilatation, normal RV function, mild to moderate mitral regurgitation, severe tricuspid regurgitation, normal functioning prosthetic aortic valve.  Heart block: She was found to have heart block and underwent Zio patch x 3 days which showed 2030 episodes of AV block (second-degree Mobitz 2, high-grade and third-degree). Had third-degree AV block with heart rate down to 36 bpm.  Suspect due to her severe calcific aortic stenosis.  Underwent PPM placement with Dr. Graciela Husbands on 01/21/2023.  Hyperlipidemia: LDL 148 on 11/29/21.  Started rosuvastatin 10 mg daily, LDL 15 on 05/08/2023.  Update lipid panel  Hypertension: On Toprol-XL and losartan and spironolactone as above.  Appears controlled  RTC in 3 months   Medication Adjustments/Labs and Tests Ordered: Current medicines are reviewed at length with the patient today.  Concerns regarding medicines are outlined above.  Orders Placed This Encounter  Procedures   Lipid panel   Basic Metabolic Panel (BMET)   Magnesium   ECHOCARDIOGRAM COMPLETE     No orders of the defined types were placed in this encounter.      Patient Instructions  Medication Instructions:  No Changes  Lab Work: Today: BMET, Magnesium, Lipid Panel  Testing/Procedures: Your physician has requested that you have an echocardiogram in about 3 months (around 01/28/2024). Echocardiography is a painless test that uses sound waves to create images of your heart. It provides your doctor with information about the size and shape of your heart and how well your heart's chambers and valves are working. This procedure takes approximately one hour. There are no restrictions for this procedure. Please do NOT wear cologne, perfume, aftershave, or lotions (deodorant is allowed). Please arrive 15 minutes prior to your  appointment time.   Follow-Up: At [facility], you and your health needs are our priority.  As part of our continuing  mission to provide you with exceptional heart care, our providers are all part of one team.  This team includes your primary Cardiologist (physician) and Advanced Practice Providers or APPs (Physician Assistants and Nurse Practitioners) who all work together to provide you with the care you need, when you need it.  Your next appointment:   3 month(s) (After Echocardiogram)  Provider:   Little Ishikawa, MD    Other Instructions Please call us or send a MyChart message with any Cardiology related questions/concerns.  [phone].  Thank you!        Valet parking services will be available as well.       Signed, Little Ishikawa, MD  10/29/2023 4:30 PM    [facility]

## 2023-10-29 NOTE — Patient Instructions (Signed)
 Medication Instructions:  No Changes  Lab Work: Today: BMET, Magnesium, Lipid Panel  Testing/Procedures: Your physician has requested that you have an echocardiogram in about 3 months (around 01/28/2024). Echocardiography is a painless test that uses sound waves to create images of your heart. It provides your doctor with information about the size and shape of your heart and how well your heart's chambers and valves are working. This procedure takes approximately one hour. There are no restrictions for this procedure. Please do NOT wear cologne, perfume, aftershave, or lotions (deodorant is allowed). Please arrive 15 minutes prior to your appointment time.   Follow-Up: At [facility], you and your health needs are our priority.  As part of our continuing mission to provide you with exceptional heart care, our providers are all part of one team.  This team includes your primary Cardiologist (physician) and Advanced Practice Providers or APPs (Physician Assistants and Nurse Practitioners) who all work together to provide you with the care you need, when you need it.  Your next appointment:   3 month(s) (After Echocardiogram)  Provider:   Little Ishikawa, MD    Other Instructions Please call us or send a MyChart message with any Cardiology related questions/concerns.  [phone].  Thank you!        Valet parking services will be available as well.

## 2023-10-30 LAB — LIPID PANEL
Chol/HDL Ratio: 3 ratio (ref 0.0–4.4)
Cholesterol, Total: 149 mg/dL (ref 100–199)
HDL: 49 mg/dL (ref >39)
LDL Chol Calc (NIH): 59 mg/dL (ref 0–99)
Triglycerides: 259 mg/dL — ABNORMAL HIGH (ref 0–149)
VLDL Cholesterol Cal: 41 mg/dL — ABNORMAL HIGH (ref 5–40)

## 2023-10-30 LAB — BASIC METABOLIC PANEL WITH GFR
BUN/Creatinine Ratio: 18 (ref 12–28)
BUN: 19 mg/dL (ref 8–27)
CO2: 23 mmol/L (ref 20–29)
Calcium: 10.1 mg/dL (ref 8.7–10.3)
Chloride: 101 mmol/L (ref 96–106)
Creatinine, Ser: 1.06 mg/dL — ABNORMAL HIGH (ref 0.57–1.00)
Glucose: 87 mg/dL (ref 70–99)
Potassium: 4.7 mmol/L (ref 3.5–5.2)
Sodium: 140 mmol/L (ref 134–144)
eGFR: 57 mL/min/{1.73_m2} — ABNORMAL LOW (ref >59)

## 2023-10-30 LAB — MAGNESIUM: Magnesium: 2 mg/dL (ref 1.6–2.3)

## 2023-10-31 ENCOUNTER — Encounter: Payer: Self-pay | Admitting: Internal Medicine

## 2023-11-04 ENCOUNTER — Encounter: Payer: Self-pay | Admitting: *Deleted

## 2023-12-02 NOTE — Progress Notes (Signed)
 Remote pacemaker transmission.

## 2023-12-11 ENCOUNTER — Encounter: Payer: Self-pay | Admitting: Cardiology

## 2023-12-15 ENCOUNTER — Encounter: Payer: Self-pay | Admitting: Sports Medicine

## 2023-12-15 ENCOUNTER — Ambulatory Visit: Admitting: Sports Medicine

## 2023-12-15 VITALS — BP 120/80 | Ht 63.5 in | Wt 160.0 lb

## 2023-12-15 DIAGNOSIS — M7750 Other enthesopathy of unspecified foot: Secondary | ICD-10-CM | POA: Diagnosis not present

## 2023-12-15 NOTE — Progress Notes (Signed)
   Subjective:    Patient ID: Charlena Conner, female    DOB: [birth year], 68 y.o.   MRN: [id number]  HPI chief complaint: Left foot pain  Patient is a very pleasant 68 year old female that presents today with sudden onset pain and swelling that began after a water aerobics class last week.  She does not recall any injury at the time and she denies any sudden increase in exercise.  Immediately afterwards, she began to notice some discomfort and instability in the left foot.  This was followed shortly thereafter by pain and swelling diffusely.  She is unable to take NSAIDs so she has been taking Tylenol  which has not been helpful.  She has also been wearing a compression wrap.  She has a history of a prior ankle sprain in the same foot 2 years ago.  Also a history of Morton's neuromas which have responded well in the past to injections.  Her pain and swelling were severe enough over the weekend where she was having difficulty walking.  Her symptoms are slowly improving.  She is leaving later this week to travel to [place] .  Past medical history reviewed Medications reviewed Allergies reviewed  Review of Systems As above    Objective:   Physical Exam  Well-developed, well-nourished.  No acute distress  Left ankle: There is a slight decrease in dorsiflexion actively.  No ankle effusion.  No tenderness to palpation around the ankle.  Negative talar tilt, negative anterior drawer.  There is some mild diffuse swelling across the dorsum of the ankle.  No palpable crepitus.  She does have reproducible pain with active ankle dorsiflexion and extension of her toes.  Good pulses.      Assessment & Plan:   Left foot and ankle pain and swelling likely secondary to tendinitis  Symptoms are already improving.  I recommended a brief period of immobilization with a short cam walker when she is active.  I have also recommended ice 2-3 times a day for 10 minutes at a time.  I would look for her symptoms to  continue to improve.  If not, then consider imaging at that time. Follow-up for ongoing or recalcitrant issues.  This note was dictated using Dragon naturally speaking software and may contain errors in syntax, spelling, or content which have not been identified prior to signing this note.

## 2023-12-16 NOTE — Telephone Encounter (Signed)
 No antibiotics needed for that procedure

## 2024-01-19 ENCOUNTER — Encounter (HOSPITAL_COMMUNITY): Payer: Medicare Other | Attending: Cardiology

## 2024-01-19 DIAGNOSIS — I441 Atrioventricular block, second degree: Secondary | ICD-10-CM

## 2024-01-19 DIAGNOSIS — Z951 Presence of aortocoronary bypass graft: Secondary | ICD-10-CM | POA: Insufficient documentation

## 2024-01-19 DIAGNOSIS — Z952 Presence of prosthetic heart valve: Secondary | ICD-10-CM | POA: Insufficient documentation

## 2024-01-19 LAB — CUP PACEART REMOTE DEVICE CHECK
Battery Remaining Longevity: 97 mo
Battery Voltage: 3.01 V
Brady Statistic AP VP Percent: 18.78 %
Brady Statistic AP VS Percent: 0.01 %
Brady Statistic AS VP Percent: 81.18 %
Brady Statistic AS VS Percent: 0.03 %
Brady Statistic RA Percent Paced: 18.75 %
Brady Statistic RV Percent Paced: 99.96 %
Date Time Interrogation Session: 20250630191639
Implantable Lead Connection Status: 753985
Implantable Lead Connection Status: 753985
Implantable Lead Implant Date: 20240703
Implantable Lead Implant Date: 20240703
Implantable Lead Location: 753859
Implantable Lead Location: 753860
Implantable Lead Model: 3830
Implantable Lead Model: 5076
Implantable Pulse Generator Implant Date: 20240703
Lead Channel Impedance Value: 285 Ohm
Lead Channel Impedance Value: 418 Ohm
Lead Channel Impedance Value: 456 Ohm
Lead Channel Impedance Value: 608 Ohm
Lead Channel Pacing Threshold Amplitude: 1.375 V
Lead Channel Pacing Threshold Amplitude: 1.625 V
Lead Channel Pacing Threshold Pulse Width: 0.4 ms
Lead Channel Pacing Threshold Pulse Width: 0.4 ms
Lead Channel Sensing Intrinsic Amplitude: 1.375 mV
Lead Channel Sensing Intrinsic Amplitude: 1.375 mV
Lead Channel Sensing Intrinsic Amplitude: 11.625 mV
Lead Channel Sensing Intrinsic Amplitude: 11.625 mV
Lead Channel Setting Pacing Amplitude: 2.5 V
Lead Channel Setting Pacing Amplitude: 2.5 V
Lead Channel Setting Pacing Pulse Width: 0.8 ms
Lead Channel Setting Sensing Sensitivity: 1.2 mV
Zone Setting Status: 755011

## 2024-01-20 ENCOUNTER — Ambulatory Visit (HOSPITAL_COMMUNITY)
Admission: RE | Admit: 2024-01-20 | Discharge: 2024-01-20 | Disposition: A | Discharge status: Home / Self Care | Source: Ambulatory Visit | Attending: Cardiology | Admitting: Cardiology

## 2024-01-20 ENCOUNTER — Ambulatory Visit: Payer: Self-pay | Admitting: Cardiology

## 2024-01-20 DIAGNOSIS — I5042 Chronic combined systolic (congestive) and diastolic (congestive) heart failure: Secondary | ICD-10-CM | POA: Diagnosis not present

## 2024-01-20 LAB — ECHOCARDIOGRAM COMPLETE
AR max vel: 1.21 cm2
AV Area VTI: 1.16 cm2
AV Area mean vel: 1.19 cm2
AV Mean grad: 9.7 mmHg
AV Peak grad: 18.2 mmHg
Ao pk vel: 2.13 m/s
Area-P 1/2: 3.5 cm2
S' Lateral: 3.2 cm

## 2024-01-25 ENCOUNTER — Ambulatory Visit: Payer: Self-pay | Admitting: Cardiology
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
## 2024-02-15 ENCOUNTER — Encounter: Payer: Self-pay | Admitting: Cardiology

## 2024-02-16 MED ORDER — AMOXICILLIN 500 MG PO CAPS: ORAL_CAPSULE | ORAL | 3 refills | Status: AC

## 2024-02-16 NOTE — Telephone Encounter (Signed)
 Yes would prescribe amoxicillin  2g as one time dose to take 1 hour before dental procedure

## 2024-02-17 ENCOUNTER — Ambulatory Visit: Attending: Physician Assistant | Admitting: Physician Assistant

## 2024-02-17 ENCOUNTER — Encounter: Payer: Self-pay | Admitting: Physician Assistant

## 2024-02-17 VITALS — BP 110/68 | HR 79 | Ht 63.5 in | Wt 165.7 lb

## 2024-02-17 DIAGNOSIS — Z950 Presence of cardiac pacemaker: Secondary | ICD-10-CM | POA: Diagnosis not present

## 2024-02-17 DIAGNOSIS — I480 Paroxysmal atrial fibrillation: Secondary | ICD-10-CM | POA: Diagnosis not present

## 2024-02-17 DIAGNOSIS — I2510 Atherosclerotic heart disease of native coronary artery without angina pectoris: Secondary | ICD-10-CM | POA: Diagnosis not present

## 2024-02-17 DIAGNOSIS — K080 Exfoliation of teeth due to systemic causes: Secondary | ICD-10-CM | POA: Diagnosis not present

## 2024-02-17 DIAGNOSIS — Z952 Presence of prosthetic heart valve: Secondary | ICD-10-CM

## 2024-02-17 LAB — CUP PACEART INCLINIC DEVICE CHECK
Battery Remaining Longevity: 97 mo
Battery Voltage: 3.01 V
Brady Statistic AP VP Percent: 19.73 %
Brady Statistic AP VS Percent: 0.01 %
Brady Statistic AS VP Percent: 80.23 %
Brady Statistic AS VS Percent: 0.03 %
Brady Statistic RA Percent Paced: 19.7 %
Brady Statistic RV Percent Paced: 99.96 %
Date Time Interrogation Session: 20250730165548
Implantable Lead Connection Status: 753985
Implantable Lead Connection Status: 753985
Implantable Lead Implant Date: 20240703
Implantable Lead Implant Date: 20240703
Implantable Lead Location: 753859
Implantable Lead Location: 753860
Implantable Lead Model: 3830
Implantable Lead Model: 5076
Implantable Pulse Generator Implant Date: 20240703
Lead Channel Impedance Value: 285 Ohm
Lead Channel Impedance Value: 418 Ohm
Lead Channel Impedance Value: 475 Ohm
Lead Channel Impedance Value: 627 Ohm
Lead Channel Pacing Threshold Amplitude: 1.5 V
Lead Channel Pacing Threshold Amplitude: 1.625 V
Lead Channel Pacing Threshold Pulse Width: 0.4 ms
Lead Channel Pacing Threshold Pulse Width: 0.4 ms
Lead Channel Sensing Intrinsic Amplitude: 1.5 mV
Lead Channel Sensing Intrinsic Amplitude: 1.625 mV
Lead Channel Sensing Intrinsic Amplitude: 11.25 mV
Lead Channel Sensing Intrinsic Amplitude: 9.625 mV
Lead Channel Setting Pacing Amplitude: 2.5 V
Lead Channel Setting Pacing Amplitude: 2.5 V
Lead Channel Setting Pacing Pulse Width: 0.8 ms
Lead Channel Setting Sensing Sensitivity: 1.2 mV
Zone Setting Status: 755011

## 2024-02-17 NOTE — Patient Instructions (Signed)
 Medication Instructions:   Your physician recommends that you continue on your current medications as directed. Please refer to the Current Medication list given to you today.   *If you need a refill on your cardiac medications before your next appointment, please call your pharmacy*   Lab Work: NONE ORDERED  TODAY   If you have labs (blood work) drawn today and your tests are completely normal, you will receive your results only by: MyChart Message (if you have MyChart) OR A paper copy in the mail If you have any lab test that is abnormal or we need to change your treatment, we will call you to review the results.  Testing/Procedures: NONE ORDERED  TODAY    Follow-Up: At [facility], you and your health needs are our priority.  As part of our continuing mission to provide you with exceptional heart care, our providers are all part of one team.  This team includes your primary Cardiologist (physician) and Advanced Practice Providers or APPs (Physician Assistants and Nurse Practitioners) who all work together to provide you with the care you need, when you need it.  Your next appointment:   1 year(s)  Provider:   Agatha Horsfall, MD or Mertha Abrahams, PA-C    We recommend signing up for the patient portal called "MyChart".  Sign up information is provided on this After Visit Summary.  MyChart is used to connect with patients for Virtual Visits (Telemedicine).  Patients are able to view lab/test results, encounter notes, upcoming appointments, etc.  Non-urgent messages can be sent to your provider as well.   To learn more about what you can do with MyChart, go to [email or web address].   Other Instructions

## 2024-02-20 ENCOUNTER — Ambulatory Visit: Payer: Self-pay | Admitting: Cardiology

## 2024-02-21 NOTE — Progress Notes (Unsigned)
 Cardiology Office Note:    Date:  02/22/2024   ID:  Tasha, Cox 1955/10/01, MRN [id number]  PCP:  Rolinda Millman, MD  Cardiologist:  Lonni LITTIE Nanas, MD  Electrophysiologist:  None   Referring MD: Rolinda Millman, MD   Chief Complaint  Patient presents with   Congestive Heart Failure     History of Present Illness:    [name] is a 68 y.o. female with a hx of CAD, chronic combined heart failure, complete heart block status post PPM, aortic stenosis status post AVR, breast cancer, hyperlipidemia who presents for follow-up.  She was referred by Dr. Rolinda for evaluation of heart murmur, initially seen on 10/24/2019.  She reports that she followed at [facility] as a child, was told she had an issue with her aortic valve.   TTE 02/05/2007 showed LVEF 55%, mild LV dilatation, mild MR, mild AI.  TTE on 11/14/2019 showed LVEF 65 to 70%, indeterminate diastolic filling, normal RV systolic function, mild aortic stenosis, mild aortic regurgitation, mild mitral regurgitation.  Echocardiogram on 01/16/2021 showed EF 40 to 45%, mild RV dysfunction, moderate to severe aortic stenosis (V-max 3.0 m/s, mean gradient 20 mmHg, AVA 0.8 cm, DI 0.24). LHC/RHC on 02/13/21 showed no evidence of CAD, moderate to severe aortic stenosis (mean gradient 27 mmHg, AVA 1.1 cm), RA 1, RV 19/0, PA 20/8/12, PW CP 8, LVEDP 8, CI 3.2.  CTA shows tricuspid aortic valve with partial fusion of RCC/LCC, possibly a forme fruste bicuspid aortic valve.  Echocardiogram on 07/16/2021 showed EF had improved to 65 to 70% and aortic stenosis was mild to moderate (V-max 2.8 m/s, mean gradient 18 mmHg, AVA 1.3 cm).  Echocardiogram 06/2022 showed an EF 60 to 65%, normal RV function, moderate to severe aortic stenosis (Vmax 3.0 m/s, MG 19 mmHg, AVA 0.8 cm^2, DI 0.27).  Echocardiogram 10/2022 showed severe aortic stenosis (mean gradient 26 mmHg, AVA 0.8 cm, DI 0.25), EF 55 to 60%, normal RV function.  She was found to have  heart block and underwent Zio patch x 3 days which showed 2030 episodes of AV block (second-degree Mobitz 2, high-grade and third-degree). Had third-degree AV block with heart rate down to 36 bpm.  Underwent PPM placement with Dr. Fernande on 01/21/2023.  LHC 04/14/2023 showed severe mid LAD stenosis.  On 05/06/2023 she underwent aortic valve replacement with 21 mm Inspiris pericardial valve and CABG x 1 with LIMA-LAD.  Echocardiogram 06/17/2023 shows EF 40 to 45%, moderate LV dilatation, normal RV function, mild to moderate mitral regurgitation, severe tricuspid regurgitation,, normal functioning prosthetic aortic valve.  Echocardiogram 01/2024 with EF 45 to 50%, mild to moderate tricuspid regurgitation, normal functioning prosthetic aortic valve  Since last clinic visit, she reports she is doing well. Denies any chest pain, lightheadedness, syncope, lower extremity edema, or palpitations.  Does report some dyspnea with walking up stairs.  However she continues to do water aerobics and yesterday hiked with friend for 2 hours.  BP Readings from Last 3 Encounters:  02/22/24 112/75  02/17/24 110/68  12/15/23 120/80     Past Medical History:  Diagnosis Date   Anxiety    Aortic valve stenosis    Arthritis    Breast cancer [facility]) 2004   right breast; ER/PR+, Her2+   Cancer (HCC) 10/2002   right breast lumpectomy   Coronary artery disease    Dysrhythmia    2nd Degree AV block   GERD (gastroesophageal reflux disease)    Heart murmur  History of kidney stones    HX   HTN (hypertension)    Hyperlipidemia    [place]orton neuroma    Personal history of chemotherapy 11/19/2002   Personal history of radiation therapy 02/19/2003   Pneumonia    Pre-diabetes    Presence of permanent cardiac pacemaker 01/21/2023   Medtronic    Past Surgical History:  Procedure Laterality Date   AORTIC VALVE REPLACEMENT N/A 05/06/2023   Procedure: AORTIC VALVE REPLACEMENT (AVR) USING 21 MM INSPIRIS RESILIA AORTIC VALVE;   Surgeon: Maryjane Mt, MD;  Location: [facility] OR;  Service: Open Heart Surgery;  Laterality: N/A;   BREAST BIOPSY Right 2019   BREAST LUMPECTOMY Right    BREAST SURGERY  10/2002   lumpectomy / right side   COLONOSCOPY  2016   CORONARY ARTERY BYPASS GRAFT N/A 05/06/2023   Procedure: CORONARY ARTERY BYPASS GRAFTING (CABG) X ONE USING LEFT INTERNAL MAMMARY ARTERY;  Surgeon: Maryjane Mt, MD;  Location: [facility] OR;  Service: Open Heart Surgery;  Laterality: N/A;   PACEMAKER IMPLANT N/A 01/21/2023   Procedure: PACEMAKER IMPLANT;  Surgeon: Fernande Elspeth BROCKS, MD;  Location: [facility] INVASIVE CV LAB;  Service: Cardiovascular;  Laterality: N/A;   RIGHT/LEFT HEART CATH AND CORONARY ANGIOGRAPHY N/A 02/13/2021   Procedure: RIGHT/LEFT HEART CATH AND CORONARY ANGIOGRAPHY;  Surgeon: Verlin Lonni BIRCH, MD;  Location: [facility] INVASIVE CV LAB;  Service: Cardiovascular;  Laterality: N/A;   RIGHT/LEFT HEART CATH AND CORONARY ANGIOGRAPHY N/A 04/14/2023   Procedure: RIGHT/LEFT HEART CATH AND CORONARY ANGIOGRAPHY;  Surgeon: [place], Peter M, MD;  Location: [facility] INVASIVE CV LAB;  Service: Cardiovascular;  Laterality: N/A;   TEE WITHOUT CARDIOVERSION N/A 05/06/2023   Procedure: TRANSESOPHAGEAL ECHOCARDIOGRAM;  Surgeon: Maryjane Mt, MD;  Location[facility] OR;  Service: Open Heart Surgery;  Laterality: N/A;    Current Medications: Current Meds  Medication Sig   acetaminophen  (TYLENOL ) 650 MG CR tablet Take 650 mg by mouth every 8 (eight) hours as needed for pain.   aspirin  EC 81 MG tablet Take 1 tablet (81 mg total) by mouth daily. Swallow whole.   Calcium  Carbonate (CALCI-CHEW PO) Take 1 tablet by mouth in the morning.   calcium  carbonate (TUMS EX) 750 MG chewable tablet Chew 2 tablets by mouth daily as needed for heartburn.   cholecalciferol (VITAMIN D3) 25 MCG (1000 UNIT) tablet Take 1,000 Units by mouth daily.   empagliflozin  (JARDIANCE ) 10 MG TABS tablet Take 1 tablet (10 mg total) by mouth daily before breakfast.   escitalopram   (LEXAPRO ) 10 MG tablet Take 10 mg by mouth daily.   losartan  (COZAAR ) 25 MG tablet Take 1 tablet (25 mg total) by mouth daily.   metoprolol  succinate (TOPROL  XL) 25 MG 24 hr tablet Take 1 tablet (25 mg total) by mouth daily.   Polyethyl Glycol-Propyl Glycol (LUBRICATING EYE DROPS OP) Place 1 drop into both eyes daily as needed (dry eyes).   rosuvastatin  (CRESTOR ) 10 MG tablet Take 10 mg by mouth daily.   spironolactone  (ALDACTONE ) 25 MG tablet Take 0.5 tablets (12.5 mg total) by mouth daily.     Allergies:   Wound dressing adhesive   Social History   Socioeconomic History   Marital status: Divorced    Spouse name: Not on file   Number of children: 2   Years of education: Not on file   Highest education level: Not on file  Occupational History   Occupation: Retired-Customer Financial planner  Tobacco Use   Smoking status: Never   Smokeless tobacco: Never  Vaping  Use   Vaping status: Never Used  Substance and Sexual Activity   Alcohol  use: Not Currently    Alcohol /week: 3.0 standard drinks of alcohol     Types: 3 Standard drinks or equivalent per week   Drug use: No   Sexual activity: Yes  Other Topics Concern   Not on file  Social History Narrative   Not on file   Social Drivers of Health   Financial Resource Strain: Not on file  Food Insecurity: Not on file  Transportation Needs: Not on file  Physical Activity: Not on file  Stress: Not on file (09/01/2019)  Social Connections: Not on file     Family History: The patient's family history includes Alzheimer's disease in her paternal aunt and paternal uncle; Bladder Cancer in her brother; Diabetes in her brother, maternal aunt, maternal uncle, and mother; Heart Problems in her maternal uncle; Heart attack in her maternal grandfather, paternal grandfather, and paternal grandmother; Heart disease in her brother, maternal aunt, mother, and paternal aunt; Hypertension in her mother; Kidney failure in her brother and mother; Lung  cancer (age of onset: 72) in her father; Skin cancer in her father and paternal uncle. There is no history of Breast cancer.  ROS:   Please see the history of present illness.  All other systems reviewed and are negative.  EKGs/Labs/Other Studies Reviewed:    The following studies were reviewed today:   EKG:   04/07/2023: Normal sinus rhythm, rate 77 02/13/23: A-ssened, V-paced, rate 63 11/04/2022: Normal sinus rhythm, rate 71, LVH 05/13/22: Normal sinus rhythm, rate 82, LVH, Q waves in V1-3 08/06/21: Normal sinus rhythm, rate 70, LVH, left axis deviation 07/22:  NSR, rate 69, LVH 10/24/2019: normal sinus rhythm, rate 73, Q waves in V1/2, no ST/T abnormalities  TTE 11/14/19:  1. Left ventricular ejection fraction, by estimation, is 65 to 70%. The  left ventricle has normal function. The left ventricle has no regional  wall motion abnormalities. Indeterminate diastolic filling due to E-A  fusion.   2. Right ventricular systolic function is normal. The right ventricular  size is normal. There is normal pulmonary artery systolic pressure.   3. The mitral valve is normal in structure. Mild mitral valve  regurgitation.   4. AV is thickened, calcified Peak and mean gradients through the valve  are 29 and 17 mm Hg respectively consistent with mild aortic stenosis..  The aortic valve is tricuspid. Aortic valve regurgitation is mild. Mild  aortic valve stenosis.   5. The inferior vena cava is normal in size with greater than 50%  respiratory variability, suggesting right atrial pressure of 3 mmHg.   Recent Labs: 07/06/2023: ALT 22; Hemoglobin 11.5; Platelets 378 10/29/2023: BUN 19; Creatinine, Ser 1.06; Magnesium  2.0; Potassium 4.7; Sodium 140  Recent Lipid Panel    Component Value Date/Time   CHOL 149 10/29/2023 1441   TRIG 259 (H) 10/29/2023 1441   HDL 49 10/29/2023 1441   CHOLHDL 3.0 10/29/2023 1441   CHOLHDL 2.2 05/08/2023 0500   VLDL 18 05/08/2023 0500   LDLCALC 59 10/29/2023  1441    Physical Exam:    VS:  BP 112/75 (BP Location: Left Arm, Patient Position: Sitting, Cuff Size: Normal)   Pulse 72   Ht 5' 3.6 (1.615 m)   Wt 162 lb 3.2 oz (73.6 kg)   SpO2 95%   BMI 28.19 kg/m     Wt Readings from Last 3 Encounters:  02/22/24 162 lb 3.2 oz (73.6 kg)  02/17/24 165 lb  11.2 oz (75.2 kg)  12/15/23 160 lb (72.6 kg)     GEN:  Well nourished, well developed in no acute distress HEENT: Normal NECK: No JVD CARDIAC: RRR,  2/6 systolic heart murmur loudest at the RUSB RESPIRATORY:  Clear to auscultation without rales, wheezing or rhonchi  ABDOMEN: Soft, non-tender, non-distended MUSCULOSKELETAL:  No edema; No deformity  SKIN: Warm and dry NEUROLOGIC:  Alert and oriented x 3 PSYCHIATRIC:  Normal affect   ASSESSMENT:    1. Chronic combined systolic and diastolic heart failure (HCC)   2. Coronary artery disease involving native coronary artery of native heart without angina pectoris   3. S/P AVR   4. Essential hypertension   5. Hyperlipidemia, unspecified hyperlipidemia type     PLAN:    Chronic combined systolic and diastolic heart failure:  Echocardiogram on 01/16/2021 showed EF 40 to 45%, mild RV dysfunction, moderate to severe aortic stenosis (V-max 3.0 m/s, mean gradient 20 mmHg, AVA 0.8 cm, DI 0.24).  LHC/RHC on 02/13/21 showed no evidence of CAD, moderate to severe aortic stenosis (mean gradient 27 mmHg, AVA 1.1 cm), RA 1, RV 19/0, PA 20/8/12, PW CP 8, LVEDP 8, CI 3.2.  CTA shows tricuspid aortic valve with partial fusion of RCC/LCC, possibly a forme fruste bicuspid aortic valve.  Echocardiogram on 07/16/2021 showed EF had improved to 65 to 70% and aortic stenosis was mild to moderate (V-max 2.8 m/s, mean gradient 18 mmHg, AVA 1.3 cm).  Echocardiogram 06/17/2023 shows EF 40 to 45%, moderate LV dilatation, normal RV function, mild to moderate mitral regurgitation, severe tricuspid regurgitation, normal functioning prosthetic aortic valve.  Echocardiogram  01/2024 with EF 45 to 50%, mild to moderate tricuspid regurgitation, normal functioning prosthetic aortic valve -Continue Toprol -XL 25 mg daily -Continue losartan  25 mg daily -Continue Jardiance  10 mg daily -Continue spironolactone  12.5 mg daily -Check BMET, magnesium   CAD: LHC 04/14/2023 showed severe mid LAD stenosis.  Status post CABG with LIMA-LAD 05/06/2023.  -Continue aspirin  81 mg daily -Continue rosuvastatin  10 mg daily  Aortic stenosis s/p AVR: Echocardiogram on 01/16/2021 showed EF 40 to 45%, mild RV dysfunction, moderate to severe aortic stenosis (V-max 3.0 m/s, mean gradient 20 mmHg, AVA 0.8 cm, DI 0.24).  LHC/RHC on 02/13/21 showed no evidence of CAD, moderate to severe aortic stenosis (mean gradient 27 mmHg, AVA 1.1 cm).  CTA shows tricuspid aortic valve with partial fusion of RCC/LCC, possibly a forme fruste bicuspid aortic valve.  Echocardiogram on 07/16/2021 showed EF had improved to 65 to 70% and aortic stenosis was mild to moderate (V-max 2.8 m/s, mean gradient 18 mmHg, AVA 1.3 cm).  Echocardiogram 06/2022 showed an EF 60 to 65%, normal RV function, moderate to severe aortic stenosis (Vmax 3.0 m/s, MG 19 mmHg, AVA 0.8 cm^2, DI 0.27).  Echocardiogram 10/2022 showed severe aortic stenosis (mean gradient 26 mmHg, AVA 0.8 cm, DI 0.25), EF 55 to 60%, normal RV function.   LHC 04/14/2023 showed severe mid LAD stenosis.  On 05/06/2023 she underwent aortic valve replacement with 21 mm Inspiris pericardial valve and CABG x 1 with LIMA-LAD.  Echocardiogram 06/17/2023 shows EF 40 to 45%, moderate LV dilatation, normal RV function, mild to moderate mitral regurgitation, severe tricuspid regurgitation, normal functioning prosthetic aortic valve.  Heart block: Status post PPM.  She was found to have heart block and underwent Zio patch x 3 days which showed 2030 episodes of AV block (second-degree Mobitz 2, high-grade and third-degree). Had third-degree AV block with heart rate down to 36 bpm.  Suspect due to her severe calcific aortic stenosis.  Underwent PPM placement with Dr. Fernande on 01/21/2023.  Hyperlipidemia: LDL 148 on 11/29/21.  On rosuvastatin  10 mg daily, LDL 59 on 10/29/2023  Hypertension: On Toprol -XL and losartan  and spironolactone  as above.  Appears controlled  RTC in 6 months   Medication Adjustments/Labs and Tests Ordered: Current medicines are reviewed at length with the patient today.  Concerns regarding medicines are outlined above.  Orders Placed This Encounter  Procedures   Basic Metabolic Panel (BMET)   Magnesium      No orders of the defined types were placed in this encounter.      Patient Instructions  Medication Instructions:  Continue current medications *If you need a refill on your cardiac medications before your next appointment, please call your pharmacy*  Lab Work: BMET, Mg TODAY If you have labs (blood work) drawn today and your tests are completely normal, you will receive your results only by: MyChart Message (if you have MyChart) OR A paper copy in the mail If you have any lab test that is abnormal or we need to change your treatment, we will call you to review the results.  Testing/Procedures: none  Follow-Up: At [facility], you and your health needs are our priority.  As part of our continuing mission to provide you with exceptional heart care, our providers are all part of one team.  This team includes your primary Cardiologist (physician) and Advanced Practice Providers or APPs (Physician Assistants and Nurse Practitioners) who all work together to provide you with the care you need, when you need it.  Your next appointment:   6 month(s)  Provider:   Lonni LITTIE Nanas, MD    We recommend signing up for the patient portal called MyChart.  Sign up information is provided on this After Visit Summary.  MyChart is used to connect with patients for Virtual Visits (Telemedicine).  Patients are able to view  lab/test results, encounter notes, upcoming appointments, etc.  Non-urgent messages can be sent to your provider as well.   To learn more about what you can do with MyChart, go to [email or web address].   Other Instructions none        Signed, Lonni LITTIE Nanas, MD  02/22/2024 9:46 AM    [facility]

## 2024-02-22 ENCOUNTER — Ambulatory Visit: Attending: Cardiology | Admitting: Cardiology

## 2024-02-22 VITALS — BP 112/75 | HR 72 | Ht 63.6 in | Wt 162.2 lb

## 2024-02-22 DIAGNOSIS — Z952 Presence of prosthetic heart valve: Secondary | ICD-10-CM

## 2024-02-22 DIAGNOSIS — I5042 Chronic combined systolic (congestive) and diastolic (congestive) heart failure: Secondary | ICD-10-CM

## 2024-02-22 DIAGNOSIS — I2510 Atherosclerotic heart disease of native coronary artery without angina pectoris: Secondary | ICD-10-CM

## 2024-02-22 DIAGNOSIS — E785 Hyperlipidemia, unspecified: Secondary | ICD-10-CM

## 2024-02-22 DIAGNOSIS — I10 Essential (primary) hypertension: Secondary | ICD-10-CM | POA: Diagnosis not present

## 2024-02-22 NOTE — Patient Instructions (Signed)
 Medication Instructions:  Continue current medications *If you need a refill on your cardiac medications before your next appointment, please call your pharmacy*  Lab Work: BMET, Mg TODAY If you have labs (blood work) drawn today and your tests are completely normal, you will receive your results only by: MyChart Message (if you have MyChart) OR A paper copy in the mail If you have any lab test that is abnormal or we need to change your treatment, we will call you to review the results.  Testing/Procedures: none  Follow-Up: At [facility], you and your health needs are our priority.  As part of our continuing mission to provide you with exceptional heart care, our providers are all part of one team.  This team includes your primary Cardiologist (physician) and Advanced Practice Providers or APPs (Physician Assistants and Nurse Practitioners) who all work together to provide you with the care you need, when you need it.  Your next appointment:   6 month(s)  Provider:   Lonni LITTIE Nanas, MD    We recommend signing up for the patient portal called MyChart.  Sign up information is provided on this After Visit Summary.  MyChart is used to connect with patients for Virtual Visits (Telemedicine).  Patients are able to view lab/test results, encounter notes, upcoming appointments, etc.  Non-urgent messages can be sent to your provider as well.   To learn more about what you can do with MyChart, go to [email or web address].   Other Instructions none

## 2024-02-23 ENCOUNTER — Ambulatory Visit: Payer: Self-pay | Admitting: Cardiology

## 2024-02-23 LAB — BASIC METABOLIC PANEL WITH GFR
BUN/Creatinine Ratio: 11 — ABNORMAL LOW (ref 12–28)
BUN: 12 mg/dL (ref 8–27)
CO2: 20 mmol/L (ref 20–29)
Calcium: 10.5 mg/dL — ABNORMAL HIGH (ref 8.7–10.3)
Chloride: 100 mmol/L (ref 96–106)
Creatinine, Ser: 1.09 mg/dL — ABNORMAL HIGH (ref 0.57–1.00)
Glucose: 107 mg/dL — ABNORMAL HIGH (ref 70–99)
Potassium: 4.6 mmol/L (ref 3.5–5.2)
Sodium: 137 mmol/L (ref 134–144)
eGFR: 55 mL/min/1.73 — ABNORMAL LOW (ref >59)

## 2024-02-23 LAB — MAGNESIUM: Magnesium: 1.8 mg/dL (ref 1.6–2.3)

## 2024-02-26 ENCOUNTER — Other Ambulatory Visit (HOSPITAL_BASED_OUTPATIENT_CLINIC_OR_DEPARTMENT_OTHER): Payer: Self-pay | Admitting: Family Medicine

## 2024-02-26 DIAGNOSIS — E559 Vitamin D deficiency, unspecified: Secondary | ICD-10-CM | POA: Diagnosis not present

## 2024-02-26 DIAGNOSIS — R7303 Prediabetes: Secondary | ICD-10-CM | POA: Diagnosis not present

## 2024-02-26 DIAGNOSIS — I10 Essential (primary) hypertension: Secondary | ICD-10-CM | POA: Diagnosis not present

## 2024-02-26 DIAGNOSIS — F321 Major depressive disorder, single episode, moderate: Secondary | ICD-10-CM | POA: Diagnosis not present

## 2024-02-26 DIAGNOSIS — R202 Paresthesia of skin: Secondary | ICD-10-CM | POA: Diagnosis not present

## 2024-02-26 DIAGNOSIS — Z0000 Encounter for general adult medical examination without abnormal findings: Secondary | ICD-10-CM | POA: Diagnosis not present

## 2024-02-26 DIAGNOSIS — Z1231 Encounter for screening mammogram for malignant neoplasm of breast: Secondary | ICD-10-CM

## 2024-02-26 DIAGNOSIS — E7800 Pure hypercholesterolemia, unspecified: Secondary | ICD-10-CM | POA: Diagnosis not present

## 2024-02-29 ENCOUNTER — Other Ambulatory Visit (HOSPITAL_BASED_OUTPATIENT_CLINIC_OR_DEPARTMENT_OTHER): Payer: Self-pay | Admitting: Family Medicine

## 2024-02-29 DIAGNOSIS — M8589 Other specified disorders of bone density and structure, multiple sites: Secondary | ICD-10-CM

## 2024-03-09 DIAGNOSIS — E538 Deficiency of other specified B group vitamins: Secondary | ICD-10-CM | POA: Diagnosis not present

## 2024-03-16 DIAGNOSIS — E538 Deficiency of other specified B group vitamins: Secondary | ICD-10-CM | POA: Diagnosis not present

## 2024-03-23 DIAGNOSIS — E538 Deficiency of other specified B group vitamins: Secondary | ICD-10-CM | POA: Diagnosis not present

## 2024-03-30 DIAGNOSIS — E538 Deficiency of other specified B group vitamins: Secondary | ICD-10-CM | POA: Diagnosis not present

## 2024-04-04 DIAGNOSIS — G5762 Lesion of plantar nerve, left lower limb: Secondary | ICD-10-CM | POA: Diagnosis not present

## 2024-04-13 ENCOUNTER — Other Ambulatory Visit: Payer: Self-pay | Admitting: Cardiology

## 2024-04-13 DIAGNOSIS — I5042 Chronic combined systolic (congestive) and diastolic (congestive) heart failure: Secondary | ICD-10-CM

## 2024-04-13 DIAGNOSIS — E785 Hyperlipidemia, unspecified: Secondary | ICD-10-CM

## 2024-04-14 ENCOUNTER — Ambulatory Visit (HOSPITAL_BASED_OUTPATIENT_CLINIC_OR_DEPARTMENT_OTHER)
Admission: RE | Admit: 2024-04-14 | Discharge: 2024-04-14 | Disposition: A | Discharge status: Home / Self Care | Source: Ambulatory Visit | Attending: Family Medicine | Admitting: Family Medicine

## 2024-04-14 ENCOUNTER — Encounter (HOSPITAL_BASED_OUTPATIENT_CLINIC_OR_DEPARTMENT_OTHER): Payer: Self-pay

## 2024-04-14 DIAGNOSIS — Z1231 Encounter for screening mammogram for malignant neoplasm of breast: Secondary | ICD-10-CM | POA: Diagnosis not present

## 2024-04-14 DIAGNOSIS — M8589 Other specified disorders of bone density and structure, multiple sites: Secondary | ICD-10-CM | POA: Diagnosis not present

## 2024-04-14 DIAGNOSIS — Z780 Asymptomatic menopausal state: Secondary | ICD-10-CM | POA: Diagnosis not present

## 2024-04-18 ENCOUNTER — Encounter: Payer: Self-pay | Admitting: Gastroenterology

## 2024-04-19 ENCOUNTER — Encounter (HOSPITAL_COMMUNITY): Payer: Medicare Other

## 2024-04-19 DIAGNOSIS — I5042 Chronic combined systolic (congestive) and diastolic (congestive) heart failure: Secondary | ICD-10-CM | POA: Diagnosis not present

## 2024-04-20 ENCOUNTER — Ambulatory Visit: Payer: Self-pay | Admitting: Cardiology

## 2024-04-20 LAB — CUP PACEART REMOTE DEVICE CHECK
Battery Remaining Longevity: 94 mo
Battery Voltage: 3.01 V
Brady Statistic AP VP Percent: 34.52 %
Brady Statistic AP VS Percent: 0.09 %
Brady Statistic AS VP Percent: 65.34 %
Brady Statistic AS VS Percent: 0.05 %
Brady Statistic RA Percent Paced: 34.58 %
Brady Statistic RV Percent Paced: 99.85 %
Date Time Interrogation Session: 20251001105214
Implantable Lead Connection Status: 753985
Implantable Lead Connection Status: 753985
Implantable Lead Implant Date: 20240703
Implantable Lead Implant Date: 20240703
Implantable Lead Location: 753859
Implantable Lead Location: 753860
Implantable Lead Model: 3830
Implantable Lead Model: 5076
Implantable Pulse Generator Implant Date: 20240703
Lead Channel Impedance Value: 285 Ohm
Lead Channel Impedance Value: 418 Ohm
Lead Channel Impedance Value: 437 Ohm
Lead Channel Impedance Value: 608 Ohm
Lead Channel Pacing Threshold Amplitude: 1.5 V
Lead Channel Pacing Threshold Amplitude: 1.75 V
Lead Channel Pacing Threshold Pulse Width: 0.4 ms
Lead Channel Pacing Threshold Pulse Width: 0.4 ms
Lead Channel Sensing Intrinsic Amplitude: 1.5 mV
Lead Channel Sensing Intrinsic Amplitude: 1.5 mV
Lead Channel Sensing Intrinsic Amplitude: 11.125 mV
Lead Channel Sensing Intrinsic Amplitude: 11.125 mV
Lead Channel Setting Pacing Amplitude: 2.5 V
Lead Channel Setting Pacing Amplitude: 2.5 V
Lead Channel Setting Pacing Pulse Width: 0.8 ms
Lead Channel Setting Sensing Sensitivity: 1.2 mV
Zone Setting Status: 755011

## 2024-04-20 NOTE — Progress Notes (Signed)
 Remote PPM Transmission

## 2024-04-21 NOTE — Progress Notes (Signed)
 Remote PPM Transmission

## 2024-05-04 DIAGNOSIS — M79672 Pain in left foot: Secondary | ICD-10-CM | POA: Diagnosis not present

## 2024-05-11 DIAGNOSIS — G5762 Lesion of plantar nerve, left lower limb: Secondary | ICD-10-CM | POA: Diagnosis not present

## 2024-05-13 ENCOUNTER — Encounter: Payer: Self-pay | Admitting: Gastroenterology

## 2024-05-13 ENCOUNTER — Ambulatory Visit: Admitting: Gastroenterology

## 2024-05-13 VITALS — BP 128/80 | HR 80 | Ht 63.0 in | Wt 166.2 lb

## 2024-05-13 DIAGNOSIS — K219 Gastro-esophageal reflux disease without esophagitis: Secondary | ICD-10-CM

## 2024-05-13 DIAGNOSIS — I5022 Chronic systolic (congestive) heart failure: Secondary | ICD-10-CM | POA: Diagnosis not present

## 2024-05-13 DIAGNOSIS — Z950 Presence of cardiac pacemaker: Secondary | ICD-10-CM

## 2024-05-13 DIAGNOSIS — Z951 Presence of aortocoronary bypass graft: Secondary | ICD-10-CM

## 2024-05-13 DIAGNOSIS — Z952 Presence of prosthetic heart valve: Secondary | ICD-10-CM

## 2024-05-13 NOTE — Patient Instructions (Addendum)
 _______________________________________________________  If your blood pressure at your visit was 140/90 or greater, please contact your primary care physician to follow up on this.  _______________________________________________________  If you are age 69 or older, your body mass index should be between 23-30. Your Body mass index is 29.45 kg/m. If this is out of the aforementioned range listed, please consider follow up with your Primary Care Provider.  If you are age 39 or younger, your body mass index should be between 19-25. Your Body mass index is 29.45 kg/m. If this is out of the aformentioned range listed, please consider follow up with your Primary Care Provider.   ________________________________________________________  The [facility] providers would like to encourage you to use MYCHART to communicate with providers for non-urgent requests or questions.  Due to long hold times on the telephone, sending your provider a message by [facility] may be a faster and more efficient way to get a response.  Please allow 48 business hours for a response.  Please remember that this is for non-urgent requests.  _______________________________________________________  [facility] is using a team-based approach to care.  Your team is made up of your doctor and two to three APPS. Our APPS (Nurse Practitioners and Physician Assistants) work with your physician to ensure care continuity for you. They are fully qualified to address your health concerns and develop a treatment plan. They communicate directly with your gastroenterologist to care for you. Seeing the Advanced Practice Practitioners on your physician's team can help you by facilitating care more promptly, often allowing for earlier appointments, access to diagnostic testing, procedures, and other specialty referrals.   It has been recommended to you by your physician that you have an upper endoscopy completed. Per your request, we  did not schedule the procedure(s) today. Please contact our office at [phone] should you decide to have the procedure completed. You will be scheduled for a pre-visit and procedure at that time.  Upper Endoscopy Code: [id number] Diagnosis Code: GERD K21.9  Thank you for entrusting me with your care and choosing [facility].  [place], PA-C

## 2024-05-13 NOTE — Progress Notes (Signed)
 Chief Complaint: Chronic GERD Primary GI MD: Dr. shila  HPI: Discussed the use of AI scribe software for clinical note transcription with the patient, who gave verbal consent to proceed.  [name] is a 68 year old female who presents with gastroesophageal reflux disease (GERD). She was referred by Dr. Rolinda for evaluation of her acid reflux.  She has a long-standing history of acid reflux, managed with over-the-counter medications such as Pepcid, Alka-Seltzer, Tagamet, Nexium, and Tums. Previously, she experienced heartburn even before eating in the morning. Currently, she takes pantoprazole  20 mg daily, which effectively controls her symptoms. She initially started on 40 mg but reduced the dose due to diarrhea. The reduced dose works well without causing diarrhea.  She has a history of coronary artery bypass grafting (CABG) in October 2024 and is currently taking baby aspirin . No symptoms of weight loss or trouble swallowing are present, but she has a sensitive stomach that reacts to triggers like wine, sugar, and artificial sweeteners, causing diarrhea.  She has not been tested for H. pylori in the past.   PREVIOUS GI WORKUP   Colonoscopy 07/2016 - Non- bleeding internal hemorrhoids.  - The examination was otherwise normal.  - No specimens collected.  Past Medical History:  Diagnosis Date   Anxiety    Aortic valve stenosis    Arthritis    Breast cancer (HCC) 2004   right breast; ER/PR+, Her2+   Cancer (HCC) 10/2002   right breast lumpectomy   CHF (congestive heart failure) (HCC)    Coronary artery disease    Dysrhythmia    2nd Degree AV block   GERD (gastroesophageal reflux disease)    Heart murmur    History of kidney stones    HX   HTN (hypertension)    Hyperlipidemia    [place] neuroma    Personal history of chemotherapy 11/19/2002   Personal history of radiation therapy 02/19/2003   Pneumonia    Pre-diabetes    Presence of permanent cardiac  pacemaker 01/21/2023   Medtronic    Past Surgical History:  Procedure Laterality Date   AORTIC VALVE REPLACEMENT N/A 05/06/2023   Procedure: AORTIC VALVE REPLACEMENT (AVR) USING 21 MM INSPIRIS RESILIA AORTIC VALVE;  Surgeon: Maryjane Mt, MD;  Location: [facility] OR;  Service: Open Heart Surgery;  Laterality: N/A;   BREAST BIOPSY Right 2019   BREAST LUMPECTOMY Right    BREAST SURGERY  10/2002   lumpectomy / right side   COLONOSCOPY  2016   CORONARY ARTERY BYPASS GRAFT N/A 05/06/2023   Procedure: CORONARY ARTERY BYPASS GRAFTING (CABG) X ONE USING LEFT INTERNAL MAMMARY ARTERY;  Surgeon: Maryjane Mt, MD;  Location: [facility] OR;  Service: Open Heart Surgery;  Laterality: N/A;   PACEMAKER IMPLANT N/A 01/21/2023   Procedure: PACEMAKER IMPLANT;  Surgeon: Fernande Elspeth BROCKS, MD;  Location[facility] INVASIVE CV LAB;  Service: Cardiovascular;  Laterality: N/A;   RIGHT/LEFT HEART CATH AND CORONARY ANGIOGRAPHY N/A 02/13/2021   Procedure: RIGHT/LEFT HEART CATH AND CORONARY ANGIOGRAPHY;  Surgeon: Verlin Lonni BIRCH, MD;  Location: [facility] INVASIVE CV LAB;  Service: Cardiovascular;  Laterality: N/A;   RIGHT/LEFT HEART CATH AND CORONARY ANGIOGRAPHY N/A 04/14/2023   Procedure: RIGHT/LEFT HEART CATH AND CORONARY ANGIOGRAPHY;  Surgeon: Swazil[place], Peter M, MD;  Location: [facility] INVASIVE CV LAB;  Service: Cardiovascular;  Laterality: N/A;   TEE WITHOUT CARDIOVERSION N/A 05/06/2023   Procedure: TRANSESOPHAGEAL ECHOCARDIOGRAM;  Surgeon: Maryjane Mt, MD;  Location: [facility] OR;  Service: Open Heart Surgery;  Laterality: N/A;  Current Outpatient Medications  Medication Sig Dispense Refill   acetaminophen  (TYLENOL ) 650 MG CR tablet Take 650 mg by mouth every 8 (eight) hours as needed for pain.     aspirin  EC 81 MG tablet Take 1 tablet (81 mg total) by mouth daily. Swallow whole. 150 tablet 2   Calcium  Carbonate (CALCI-CHEW PO) Take 1 tablet by mouth in the morning.     cholecalciferol (VITAMIN D3) 25 MCG (1000 UNIT) tablet Take 1,000 Units  by mouth daily.     Cyanocobalamin (B-12 PO) Take 1 tablet by mouth daily.     empagliflozin  (JARDIANCE ) 10 MG TABS tablet Take 1 tablet (10 mg total) by mouth daily before breakfast. 90 tablet 3   escitalopram  (LEXAPRO ) 10 MG tablet Take 10 mg by mouth daily.     losartan  (COZAAR ) 25 MG tablet Take 1 tablet (25 mg total) by mouth daily. 90 tablet 3   metoprolol  succinate (TOPROL  XL) 25 MG 24 hr tablet Take 1 tablet (25 mg total) by mouth daily. 90 tablet 3   pantoprazole  (PROTONIX ) 40 MG tablet Take 20 mg by mouth daily.     Polyethyl Glycol-Propyl Glycol (LUBRICATING EYE DROPS OP) Place 1 drop into both eyes daily as needed (dry eyes).     rosuvastatin  (CRESTOR ) 10 MG tablet Take 10 mg by mouth daily.     spironolactone  (ALDACTONE ) 25 MG tablet Take 0.5 tablets (12.5 mg total) by mouth daily. 45 tablet 3   amoxicillin  (AMOXIL ) 500 MG capsule Take 2 g ( 4 tablets)  as one time dose to take 1 hour before dental procedure. (Patient not taking: Reported on 05/13/2024) 4 capsule 3   No current facility-administered medications for this visit.    Allergies as of 05/13/2024 - Review Complete 05/13/2024  Allergen Reaction Noted   Tape  05/13/2024   Wound dressing adhesive  04/22/2023    Family History  Problem Relation Age of Onset   Hypertension Mother    Heart disease Mother    Diabetes Mother    Kidney failure Mother    Skin cancer Father        on ear; unknown type   Lung cancer Father 24       smoker; metastasis to bone and liver   Bone cancer Father    Liver cancer Father    Diabetes Brother    Heart disease Brother    Kidney failure Brother    Bladder Cancer Brother        dx. late 25s; smoker   Kidney failure Brother    Diabetes Brother    Heart disease Brother    Heart attack Maternal Grandfather    Heart attack Paternal Grandmother    Heart attack Paternal Grandfather    Diabetes Maternal Aunt    Heart disease Maternal Aunt    Heart Problems Maternal Uncle     Diabetes Maternal Uncle    Alzheimer's disease Paternal Aunt    Heart disease Paternal Aunt    Skin cancer Paternal Uncle        on nose; unknown type   Alzheimer's disease Paternal Uncle    Breast cancer Neg Hx     Social History   Socioeconomic History   Marital status: Divorced    Spouse name: Not on file   Number of children: 2   Years of education: Not on file   Highest education level: Not on file  Occupational History   Occupation: Retired-Customer Financial planner  Tobacco Use  Smoking status: Never   Smokeless tobacco: Never  Vaping Use   Vaping status: Never Used  Substance and Sexual Activity   Alcohol  use: Yes    Alcohol /week: 3.0 standard drinks of alcohol     Types: 3 Standard drinks or equivalent per week    Comment: 0-1 per day   Drug use: No   Sexual activity: Yes  Other Topics Concern   Not on file  Social History Narrative   Not on file   Social Drivers of Health   Financial Resource Strain: Not on file  Food Insecurity: Not on file  Transportation Needs: Not on file  Physical Activity: Not on file  Stress: Not on file (09/01/2019)  Social Connections: Not on file  Intimate Partner Violence: Low Risk  (05/10/2020)   Received from [facility]   Intimate Partner Violence    Insults You: Not on file    Threatens You: Not on file    Screams at [place]: Not on file    Physically Hurt: Not on file    Intimate Partner Violence Score: Not on file    Review of Systems:    Constitutional: No weight loss, fever, chills, weakness or fatigue HEENT: Eyes: No change in vision               Ears, Nose, Throat:  No change in hearing or congestion Skin: No rash or itching Cardiovascular: No chest pain, chest pressure or palpitations   Respiratory: No SOB or cough Gastrointestinal: See HPI and otherwise negative Genitourinary: No dysuria or change in urinary frequency Neurological: No headache, dizziness or syncope Musculoskeletal: No new muscle or joint  pain Hematologic: No bleeding or bruising Psychiatric: No history of depression or anxiety    Physical Exam:  Vital signs: BP 128/80 (BP Location: Left Arm, Patient Position: Sitting, Cuff Size: Normal)   Pulse 80   Ht 5' 3 (1.6 m) Comment: height measured without shoes  Wt 166 lb 4 oz (75.4 kg)   BMI 29.45 kg/m   Constitutional: NAD, alert and cooperative Head:  Normocephalic and atraumatic. Eyes:   PEERL, EOMI. No icterus. Conjunctiva pink. Respiratory: Respirations even and unlabored. Lungs clear to auscultation bilaterally.   No wheezes, crackles, or rhonchi.  Cardiovascular:  Regular rate and rhythm. No peripheral edema, cyanosis or pallor.  Gastrointestinal:  Soft, nondistended, nontender. No rebound or guarding. Normal bowel sounds. No appreciable masses or hepatomegaly. Rectal:  Declines Msk:  Symmetrical without gross deformities. Without edema, no deformity or joint abnormality.  Neurologic:  Alert and  oriented x4;  grossly normal neurologically.  Skin:   Dry and intact without significant lesions or rashes. Psychiatric: Oriented to person, place and time. Demonstrates good judgement and reason without abnormal affect or behaviors.   RELEVANT LABS AND IMAGING: CBC    Component Value Date/Time   WBC 9.5 07/06/2023 1038   WBC 6.8 05/11/2023 0328   RBC 4.52 07/06/2023 1038   RBC 2.87 (L) 05/11/2023 0328   HGB 11.5 07/06/2023 1038   HGB 12.6 02/01/2015 1430   HCT 38.0 07/06/2023 1038   HCT 37.9 02/01/2015 1430   PLT 378 07/06/2023 1038   MCV 84 07/06/2023 1038   MCV 83.2 02/01/2015 1430   MCH 25.4 (L) 07/06/2023 1038   MCH 27.5 05/11/2023 0328   MCHC 30.3 (L) 07/06/2023 1038   MCHC 32.1 05/11/2023 0328   RDW 14.1 07/06/2023 1038   RDW 15.1 (H) 02/01/2015 1430   LYMPHSABS 1.5 07/06/2023 1038   LYMPHSABS 1.3  02/01/2015 1430   MONOABS 0.4 02/01/2015 1430   EOSABS 0.3 07/06/2023 1038   BASOSABS 0.1 07/06/2023 1038   BASOSABS 0.0 02/01/2015 1430    CMP      Component Value Date/Time   NA 137 02/22/2024 1018   NA 143 02/01/2015 1430   K 4.6 02/22/2024 1018   K 3.7 02/01/2015 1430   CL 100 02/22/2024 1018   CO2 20 02/22/2024 1018   CO2 28 02/01/2015 1430   GLUCOSE 107 (H) 02/22/2024 1018   GLUCOSE 115 (H) 05/09/2023 0354   GLUCOSE 126 02/01/2015 1430   BUN 12 02/22/2024 1018   BUN 9.9 02/01/2015 1430   CREATININE 1.09 (H) 02/22/2024 1018   CREATININE 0.8 02/01/2015 1430   CALCIUM  10.5 (H) 02/22/2024 1018   CALCIUM  9.6 02/01/2015 1430   PROT 8.0 07/06/2023 1038   PROT 7.4 02/01/2015 1430   ALBUMIN  4.8 07/06/2023 1038   ALBUMIN  4.0 02/01/2015 1430   AST 23 07/06/2023 1038   AST 28 02/01/2015 1430   ALT 22 07/06/2023 1038   ALT 48 02/01/2015 1430   ALKPHOS 68 07/06/2023 1038   ALKPHOS 107 02/01/2015 1430   BILITOT 0.7 07/06/2023 1038   BILITOT 0.32 02/01/2015 1430   GFRNONAA >60 05/09/2023 0354     Assessment/Plan:   Chronic GERD Chronic GERD for many years ultimately refractory to over-the-counter symptom now well-controlled on pantoprazole  20 mg once daily.  Referred here by PCP for EGD due to chronicity of GERD.  No previous EGD.  Patient would like to pursue EGD if it is covered by insurance but if it is not she would like to avoid - See if EGD is covered by insurance, if it is not covered she wishes to decline EGD would at least recommend H. pylori stool testing - Continue pantoprazole  20 mg once daily - Educated patient on lifestyle modifications  CAD CHF Complete heart block s/p PPM Aortic stenosis s/p AVR Echocardiogram 01/2024 with EF 45 to 50%, mild to moderate tricuspid regurgitation, normal prosthetic aortic valve LHC 04/14/2023 with severe mid LAD stenosis s/p CABG 05/06/2023  Colon cancer screening Colonoscopy 2018 which showed hemorrhoids and otherwise normal repeat 2028  Nestor Blower, PA-C [facility] 05/13/2024, 11:49 AM  Cc: Rolinda Millman, MD

## 2024-06-25 ENCOUNTER — Other Ambulatory Visit: Payer: Self-pay | Admitting: Cardiology

## 2024-06-25 DIAGNOSIS — I10 Essential (primary) hypertension: Secondary | ICD-10-CM

## 2024-06-27 ENCOUNTER — Other Ambulatory Visit: Payer: Self-pay | Admitting: Cardiology

## 2024-06-27 DIAGNOSIS — I10 Essential (primary) hypertension: Secondary | ICD-10-CM

## 2024-06-28 ENCOUNTER — Encounter: Payer: Self-pay | Admitting: Cardiology

## 2024-06-28 MED ORDER — METOPROLOL SUCCINATE ER 25 MG PO TB24: 25.0000 mg | ORAL_TABLET | Freq: Every day | ORAL | 2 refills | Status: AC

## 2024-07-03 ENCOUNTER — Other Ambulatory Visit: Payer: Self-pay | Admitting: Cardiology

## 2024-07-03 DIAGNOSIS — E785 Hyperlipidemia, unspecified: Secondary | ICD-10-CM

## 2024-07-03 DIAGNOSIS — I5042 Chronic combined systolic (congestive) and diastolic (congestive) heart failure: Secondary | ICD-10-CM

## 2024-07-17 ENCOUNTER — Encounter: Payer: Self-pay | Admitting: Cardiology

## 2024-07-17 DIAGNOSIS — R7303 Prediabetes: Secondary | ICD-10-CM

## 2024-07-17 DIAGNOSIS — I5022 Chronic systolic (congestive) heart failure: Secondary | ICD-10-CM

## 2024-07-19 ENCOUNTER — Encounter (HOSPITAL_COMMUNITY): Payer: Medicare Other

## 2024-07-19 DIAGNOSIS — I5042 Chronic combined systolic (congestive) and diastolic (congestive) heart failure: Secondary | ICD-10-CM

## 2024-07-19 NOTE — Telephone Encounter (Signed)
 Can we refer her to our pharmacy weight loss clinic?

## 2024-07-20 LAB — CUP PACEART REMOTE DEVICE CHECK
Battery Remaining Longevity: 92 mo
Battery Voltage: 3 V
Brady Statistic AP VP Percent: 20.83 %
Brady Statistic AP VS Percent: 0.04 %
Brady Statistic AS VP Percent: 79.08 %
Brady Statistic AS VS Percent: 0.05 %
Brady Statistic RA Percent Paced: 20.85 %
Brady Statistic RV Percent Paced: 99.91 %
Date Time Interrogation Session: 20251230080617
Implantable Lead Connection Status: 753985
Implantable Lead Connection Status: 753985
Implantable Lead Implant Date: 20240703
Implantable Lead Implant Date: 20240703
Implantable Lead Location: 753859
Implantable Lead Location: 753860
Implantable Lead Model: 3830
Implantable Lead Model: 5076
Implantable Pulse Generator Implant Date: 20240703
Lead Channel Impedance Value: 304 Ohm
Lead Channel Impedance Value: 418 Ohm
Lead Channel Impedance Value: 456 Ohm
Lead Channel Impedance Value: 608 Ohm
Lead Channel Pacing Threshold Amplitude: 1.5 V
Lead Channel Pacing Threshold Amplitude: 1.5 V
Lead Channel Pacing Threshold Pulse Width: 0.4 ms
Lead Channel Pacing Threshold Pulse Width: 0.4 ms
Lead Channel Sensing Intrinsic Amplitude: 10.875 mV
Lead Channel Sensing Intrinsic Amplitude: 10.875 mV
Lead Channel Sensing Intrinsic Amplitude: 2.125 mV
Lead Channel Sensing Intrinsic Amplitude: 2.125 mV
Lead Channel Setting Pacing Amplitude: 2.5 V
Lead Channel Setting Pacing Amplitude: 2.5 V
Lead Channel Setting Pacing Pulse Width: 0.8 ms
Lead Channel Setting Sensing Sensitivity: 1.2 mV
Zone Setting Status: 755011

## 2024-07-22 ENCOUNTER — Ambulatory Visit: Payer: Self-pay | Admitting: Cardiology

## 2024-07-27 NOTE — Progress Notes (Signed)
 Remote PPM Transmission

## 2024-09-08 ENCOUNTER — Ambulatory Visit: Admitting: Pharmacist

## 2024-09-16 ENCOUNTER — Ambulatory Visit: Admitting: Cardiology

## 2024-10-18 ENCOUNTER — Encounter (HOSPITAL_COMMUNITY): Payer: Medicare Other

## 2025-01-17 ENCOUNTER — Encounter (HOSPITAL_COMMUNITY): Payer: Medicare Other
